# Patient Record
Sex: Male | Born: 1950
Health system: Southern US, Community
[De-identification: ages and names within clinical notes are randomized; demographics above are authoritative.]

## PROBLEM LIST (undated history)

## (undated) DIAGNOSIS — C44712 Basal cell carcinoma of skin of right lower limb, including hip: Secondary | ICD-10-CM

## (undated) DIAGNOSIS — F17210 Nicotine dependence, cigarettes, uncomplicated: Secondary | ICD-10-CM

## (undated) DIAGNOSIS — R918 Other nonspecific abnormal finding of lung field: Secondary | ICD-10-CM

## (undated) DIAGNOSIS — J189 Pneumonia, unspecified organism: Secondary | ICD-10-CM

## (undated) DIAGNOSIS — I10 Essential (primary) hypertension: Secondary | ICD-10-CM

## (undated) DIAGNOSIS — L02419 Cutaneous abscess of limb, unspecified: Secondary | ICD-10-CM

## (undated) DIAGNOSIS — N401 Enlarged prostate with lower urinary tract symptoms: Secondary | ICD-10-CM

## (undated) DIAGNOSIS — L03119 Cellulitis of unspecified part of limb: Secondary | ICD-10-CM

## (undated) DIAGNOSIS — A4902 Methicillin resistant Staphylococcus aureus infection, unspecified site: Secondary | ICD-10-CM

## (undated) DIAGNOSIS — R351 Nocturia: Secondary | ICD-10-CM

## (undated) DIAGNOSIS — C61 Malignant neoplasm of prostate: Secondary | ICD-10-CM

## (undated) DIAGNOSIS — E871 Hypo-osmolality and hyponatremia: Secondary | ICD-10-CM

## (undated) DIAGNOSIS — M1990 Unspecified osteoarthritis, unspecified site: Secondary | ICD-10-CM

## (undated) DIAGNOSIS — F1010 Alcohol abuse, uncomplicated: Secondary | ICD-10-CM

## (undated) DIAGNOSIS — L309 Dermatitis, unspecified: Secondary | ICD-10-CM

## (undated) HISTORY — PX: BACK SURGERY: SHX140

## (undated) HISTORY — PX: EYE SURGERY: SHX253

## (undated) HISTORY — PX: OTHER SURGICAL HISTORY: SHX169

## (undated) HISTORY — PX: COLONOSCOPY W/ POLYPECTOMY: SHX1380

## (undated) HISTORY — PX: RETINAL DETACHMENT SURGERY: SHX105

---

## 1998-07-29 ENCOUNTER — Emergency Department (HOSPITAL_COMMUNITY): Admission: EM | Admit: 1998-07-29 | Discharge: 1998-07-29 | Payer: Self-pay | Admitting: Emergency Medicine

## 1998-08-10 ENCOUNTER — Emergency Department (HOSPITAL_COMMUNITY): Admission: EM | Admit: 1998-08-10 | Discharge: 1998-08-10 | Payer: Self-pay | Admitting: Emergency Medicine

## 2001-01-05 ENCOUNTER — Emergency Department (HOSPITAL_COMMUNITY): Admission: EM | Admit: 2001-01-05 | Discharge: 2001-01-05 | Payer: Self-pay | Admitting: *Deleted

## 2002-11-09 ENCOUNTER — Encounter: Payer: Self-pay | Admitting: Orthopedic Surgery

## 2002-11-10 ENCOUNTER — Observation Stay (HOSPITAL_COMMUNITY): Admission: RE | Admit: 2002-11-10 | Discharge: 2002-11-11 | Payer: Self-pay | Admitting: Orthopedic Surgery

## 2002-11-10 ENCOUNTER — Encounter: Payer: Self-pay | Admitting: Orthopedic Surgery

## 2003-11-04 ENCOUNTER — Inpatient Hospital Stay (HOSPITAL_BASED_OUTPATIENT_CLINIC_OR_DEPARTMENT_OTHER): Admission: RE | Admit: 2003-11-04 | Discharge: 2003-11-04 | Payer: Self-pay | Admitting: Cardiology

## 2014-01-27 HISTORY — PX: OTHER SURGICAL HISTORY: SHX169

## 2014-02-08 ENCOUNTER — Inpatient Hospital Stay (HOSPITAL_COMMUNITY)
Admission: EM | Admit: 2014-02-08 | Discharge: 2014-02-16 | DRG: 854 | Disposition: A | Discharge status: Home / Self Care | Payer: 59 | Attending: Internal Medicine | Admitting: Internal Medicine

## 2014-02-08 ENCOUNTER — Emergency Department (HOSPITAL_COMMUNITY): Payer: 59

## 2014-02-08 ENCOUNTER — Encounter (HOSPITAL_COMMUNITY): Payer: Self-pay | Admitting: Emergency Medicine

## 2014-02-08 DIAGNOSIS — F1010 Alcohol abuse, uncomplicated: Secondary | ICD-10-CM | POA: Diagnosis present

## 2014-02-08 DIAGNOSIS — C44711 Basal cell carcinoma of skin of unspecified lower limb, including hip: Secondary | ICD-10-CM | POA: Diagnosis present

## 2014-02-08 DIAGNOSIS — M009 Pyogenic arthritis, unspecified: Secondary | ICD-10-CM | POA: Diagnosis present

## 2014-02-08 DIAGNOSIS — M7040 Prepatellar bursitis, unspecified knee: Secondary | ICD-10-CM | POA: Diagnosis present

## 2014-02-08 DIAGNOSIS — C44722 Squamous cell carcinoma of skin of right lower limb, including hip: Secondary | ICD-10-CM | POA: Diagnosis present

## 2014-02-08 DIAGNOSIS — L259 Unspecified contact dermatitis, unspecified cause: Secondary | ICD-10-CM | POA: Diagnosis present

## 2014-02-08 DIAGNOSIS — F17210 Nicotine dependence, cigarettes, uncomplicated: Secondary | ICD-10-CM

## 2014-02-08 DIAGNOSIS — L02419 Cutaneous abscess of limb, unspecified: Secondary | ICD-10-CM | POA: Diagnosis present

## 2014-02-08 DIAGNOSIS — L03116 Cellulitis of left lower limb: Secondary | ICD-10-CM

## 2014-02-08 DIAGNOSIS — T3695XA Adverse effect of unspecified systemic antibiotic, initial encounter: Secondary | ICD-10-CM | POA: Diagnosis not present

## 2014-02-08 DIAGNOSIS — A4902 Methicillin resistant Staphylococcus aureus infection, unspecified site: Secondary | ICD-10-CM | POA: Diagnosis present

## 2014-02-08 DIAGNOSIS — E871 Hypo-osmolality and hyponatremia: Secondary | ICD-10-CM

## 2014-02-08 DIAGNOSIS — L03119 Cellulitis of unspecified part of limb: Secondary | ICD-10-CM | POA: Diagnosis present

## 2014-02-08 DIAGNOSIS — Z85828 Personal history of other malignant neoplasm of skin: Secondary | ICD-10-CM | POA: Diagnosis present

## 2014-02-08 DIAGNOSIS — M79609 Pain in unspecified limb: Secondary | ICD-10-CM

## 2014-02-08 DIAGNOSIS — L0390 Cellulitis, unspecified: Secondary | ICD-10-CM | POA: Diagnosis present

## 2014-02-08 DIAGNOSIS — F17200 Nicotine dependence, unspecified, uncomplicated: Secondary | ICD-10-CM | POA: Diagnosis present

## 2014-02-08 DIAGNOSIS — A419 Sepsis, unspecified organism: Principal | ICD-10-CM

## 2014-02-08 DIAGNOSIS — Z720 Tobacco use: Secondary | ICD-10-CM | POA: Diagnosis present

## 2014-02-08 DIAGNOSIS — I10 Essential (primary) hypertension: Secondary | ICD-10-CM | POA: Diagnosis present

## 2014-02-08 DIAGNOSIS — L309 Dermatitis, unspecified: Secondary | ICD-10-CM | POA: Diagnosis present

## 2014-02-08 DIAGNOSIS — B9689 Other specified bacterial agents as the cause of diseases classified elsewhere: Secondary | ICD-10-CM | POA: Diagnosis present

## 2014-02-08 DIAGNOSIS — M7989 Other specified soft tissue disorders: Secondary | ICD-10-CM

## 2014-02-08 DIAGNOSIS — C44712 Basal cell carcinoma of skin of right lower limb, including hip: Secondary | ICD-10-CM

## 2014-02-08 DIAGNOSIS — M71162 Other infective bursitis, left knee: Secondary | ICD-10-CM

## 2014-02-08 HISTORY — DX: Essential (primary) hypertension: I10

## 2014-02-08 HISTORY — DX: Basal cell carcinoma of skin of right lower limb, including hip: C44.712

## 2014-02-08 HISTORY — DX: Hypo-osmolality and hyponatremia: E87.1

## 2014-02-08 LAB — BASIC METABOLIC PANEL
Anion gap: 18 — ABNORMAL HIGH (ref 5–15)
BUN: 12 mg/dL (ref 6–23)
CO2: 19 mEq/L (ref 19–32)
Calcium: 8.9 mg/dL (ref 8.4–10.5)
Chloride: 84 mEq/L — ABNORMAL LOW (ref 96–112)
Creatinine, Ser: 1.17 mg/dL (ref 0.50–1.35)
GFR calc Af Amer: 75 mL/min — ABNORMAL LOW (ref >90)
GFR calc non Af Amer: 65 mL/min — ABNORMAL LOW (ref >90)
Glucose, Bld: 101 mg/dL — ABNORMAL HIGH (ref 70–99)
Potassium: 5.2 mEq/L (ref 3.7–5.3)
Sodium: 121 mEq/L — CL (ref 137–147)

## 2014-02-08 LAB — CBC WITH DIFFERENTIAL/PLATELET
Basophils Absolute: 0 10*3/uL (ref 0.0–0.1)
Basophils Relative: 0 % (ref 0–1)
Eosinophils Absolute: 0 10*3/uL (ref 0.0–0.7)
Eosinophils Relative: 0 % (ref 0–5)
HCT: 39.2 % (ref 39.0–52.0)
Hemoglobin: 14.1 g/dL (ref 13.0–17.0)
Lymphocytes Relative: 9 % — ABNORMAL LOW (ref 12–46)
Lymphs Abs: 1.5 10*3/uL (ref 0.7–4.0)
MCH: 32.7 pg (ref 26.0–34.0)
MCHC: 36 g/dL (ref 30.0–36.0)
MCV: 91 fL (ref 78.0–100.0)
Monocytes Absolute: 1.5 10*3/uL — ABNORMAL HIGH (ref 0.1–1.0)
Monocytes Relative: 9 % (ref 3–12)
Neutro Abs: 13.9 10*3/uL — ABNORMAL HIGH (ref 1.7–7.7)
Neutrophils Relative %: 82 % — ABNORMAL HIGH (ref 43–77)
Platelets: 252 10*3/uL (ref 150–400)
RBC: 4.31 MIL/uL (ref 4.22–5.81)
RDW: 12.6 % (ref 11.5–15.5)
WBC: 16.9 10*3/uL — ABNORMAL HIGH (ref 4.0–10.5)

## 2014-02-08 LAB — CK: Total CK: 32 U/L (ref 7–232)

## 2014-02-08 LAB — HEPATIC FUNCTION PANEL
ALT: 14 U/L (ref 0–53)
AST: 18 U/L (ref 0–37)
Albumin: 2.7 g/dL — ABNORMAL LOW (ref 3.5–5.2)
Alkaline Phosphatase: 82 U/L (ref 39–117)
Bilirubin, Direct: 0.4 mg/dL — ABNORMAL HIGH (ref 0.0–0.3)
Indirect Bilirubin: 0.9 mg/dL (ref 0.3–0.9)
Total Bilirubin: 1.3 mg/dL — ABNORMAL HIGH (ref 0.3–1.2)
Total Protein: 6.6 g/dL (ref 6.0–8.3)

## 2014-02-08 LAB — NA AND K (SODIUM & POTASSIUM), RAND UR
Potassium Urine: 62 mEq/L
Sodium, Ur: <20 mEq/L

## 2014-02-08 LAB — SYNOVIAL CELL COUNT + DIFF, W/ CRYSTALS
Eosinophils-Synovial: 0 % (ref 0–1)
Lymphocytes-Synovial Fld: 0 % (ref 0–20)
Monocyte-Macrophage-Synovial Fluid: 1 % — ABNORMAL LOW (ref 50–90)
Neutrophil, Synovial: 99 % — ABNORMAL HIGH (ref 0–25)

## 2014-02-08 LAB — CREATININE, SERUM
Creatinine, Ser: 1.1 mg/dL (ref 0.50–1.35)
GFR calc Af Amer: 81 mL/min — ABNORMAL LOW (ref >90)
GFR calc non Af Amer: 70 mL/min — ABNORMAL LOW (ref >90)

## 2014-02-08 LAB — CBC
HCT: 36.4 % — ABNORMAL LOW (ref 39.0–52.0)
Hemoglobin: 12.6 g/dL — ABNORMAL LOW (ref 13.0–17.0)
MCH: 31.5 pg (ref 26.0–34.0)
MCHC: 34.6 g/dL (ref 30.0–36.0)
MCV: 91 fL (ref 78.0–100.0)
Platelets: 235 10*3/uL (ref 150–400)
RBC: 4 MIL/uL — ABNORMAL LOW (ref 4.22–5.81)
RDW: 12.6 % (ref 11.5–15.5)
WBC: 15.1 10*3/uL — ABNORMAL HIGH (ref 4.0–10.5)

## 2014-02-08 LAB — ETHANOL: Alcohol, Ethyl (B): <11 mg/dL (ref 0–11)

## 2014-02-08 LAB — GRAM STAIN

## 2014-02-08 LAB — C-REACTIVE PROTEIN: CRP: 36.7 mg/dL — ABNORMAL HIGH (ref <0.60)

## 2014-02-08 LAB — OSMOLALITY: Osmolality: 255 mOsm/kg — ABNORMAL LOW (ref 275–300)

## 2014-02-08 LAB — I-STAT CG4 LACTIC ACID, ED: Lactic Acid, Venous: 1.29 mmol/L (ref 0.5–2.2)

## 2014-02-08 LAB — SEDIMENTATION RATE: Sed Rate: 67 mm/hr — ABNORMAL HIGH (ref 0–16)

## 2014-02-08 LAB — OSMOLALITY, URINE: Osmolality, Ur: 400 mOsm/kg (ref 390–1090)

## 2014-02-08 MED ORDER — LORAZEPAM 2 MG/ML IJ SOLN: 0.0000 mg | Freq: Two times a day (BID) | INTRAMUSCULAR | Status: DC

## 2014-02-08 MED ORDER — ADULT MULTIVITAMIN W/MINERALS CH
1.0000 | ORAL_TABLET | Freq: Every day | ORAL | Status: DC
  Administered 2014-02-08 – 2014-02-16 (×8): 1 via ORAL
  Filled 2014-02-08 (×9): qty 1

## 2014-02-08 MED ORDER — RAMIPRIL 5 MG PO CAPS
5.0000 mg | ORAL_CAPSULE | Freq: Every day | ORAL | Status: DC
  Administered 2014-02-09 – 2014-02-16 (×7): 5 mg via ORAL
  Filled 2014-02-08 (×8): qty 1

## 2014-02-08 MED ORDER — ACETAMINOPHEN 650 MG RE SUPP: 650.0000 mg | Freq: Four times a day (QID) | RECTAL | Status: DC | PRN

## 2014-02-08 MED ORDER — VITAMIN B-1 100 MG PO TABS
100.0000 mg | ORAL_TABLET | Freq: Every day | ORAL | Status: DC
  Administered 2014-02-08 – 2014-02-16 (×8): 100 mg via ORAL
  Filled 2014-02-08 (×9): qty 1

## 2014-02-08 MED ORDER — LORAZEPAM 2 MG/ML IJ SOLN
0.0000 mg | Freq: Four times a day (QID) | INTRAMUSCULAR | Status: AC
  Administered 2014-02-09: 2 mg via INTRAVENOUS
  Filled 2014-02-08: qty 1

## 2014-02-08 MED ORDER — ACETAMINOPHEN 325 MG PO TABS
650.0000 mg | ORAL_TABLET | Freq: Four times a day (QID) | ORAL | Status: DC | PRN
  Administered 2014-02-09: 650 mg via ORAL
  Filled 2014-02-08: qty 2

## 2014-02-08 MED ORDER — ONDANSETRON HCL 4 MG/2ML IJ SOLN
4.0000 mg | Freq: Once | INTRAMUSCULAR | Status: AC
  Administered 2014-02-08: 4 mg via INTRAVENOUS
  Filled 2014-02-08: qty 2

## 2014-02-08 MED ORDER — LORAZEPAM 2 MG/ML IJ SOLN: 1.0000 mg | Freq: Four times a day (QID) | INTRAMUSCULAR | Status: DC | PRN

## 2014-02-08 MED ORDER — MORPHINE SULFATE 4 MG/ML IJ SOLN
4.0000 mg | Freq: Once | INTRAMUSCULAR | Status: AC
  Administered 2014-02-08: 4 mg via INTRAVENOUS
  Filled 2014-02-08: qty 1

## 2014-02-08 MED ORDER — VANCOMYCIN HCL IN DEXTROSE 750-5 MG/150ML-% IV SOLN
750.0000 mg | Freq: Two times a day (BID) | INTRAVENOUS | Status: DC
  Administered 2014-02-09 – 2014-02-11 (×4): 750 mg via INTRAVENOUS
  Filled 2014-02-08 (×6): qty 150

## 2014-02-08 MED ORDER — THIAMINE HCL 100 MG/ML IJ SOLN
100.0000 mg | Freq: Every day | INTRAMUSCULAR | Status: DC
  Filled 2014-02-08 (×3): qty 1

## 2014-02-08 MED ORDER — AMLODIPINE BESYLATE 10 MG PO TABS
10.0000 mg | ORAL_TABLET | Freq: Every day | ORAL | Status: DC
  Administered 2014-02-09 – 2014-02-16 (×7): 10 mg via ORAL
  Filled 2014-02-08 (×3): qty 1
  Filled 2014-02-08: qty 2
  Filled 2014-02-08 (×2): qty 1
  Filled 2014-02-08: qty 2
  Filled 2014-02-08: qty 1

## 2014-02-08 MED ORDER — SODIUM CHLORIDE 0.9 % IV BOLUS (SEPSIS)
500.0000 mL | Freq: Once | INTRAVENOUS | Status: AC
  Administered 2014-02-08: 500 mL via INTRAVENOUS

## 2014-02-08 MED ORDER — HEPARIN SODIUM (PORCINE) 5000 UNIT/ML IJ SOLN
5000.0000 [IU] | Freq: Three times a day (TID) | INTRAMUSCULAR | Status: DC
  Administered 2014-02-08 – 2014-02-16 (×22): 5000 [IU] via SUBCUTANEOUS
  Filled 2014-02-08 (×30): qty 1

## 2014-02-08 MED ORDER — SODIUM CHLORIDE 0.9 % IV BOLUS (SEPSIS): 1000.0000 mL | Freq: Once | INTRAVENOUS | Status: DC

## 2014-02-08 MED ORDER — FOLIC ACID 1 MG PO TABS
1.0000 mg | ORAL_TABLET | Freq: Every day | ORAL | Status: DC
  Administered 2014-02-08 – 2014-02-16 (×8): 1 mg via ORAL
  Filled 2014-02-08 (×9): qty 1

## 2014-02-08 MED ORDER — VANCOMYCIN HCL 10 G IV SOLR
1500.0000 mg | Freq: Once | INTRAVENOUS | Status: AC
  Administered 2014-02-08: 1500 mg via INTRAVENOUS
  Filled 2014-02-08: qty 1500

## 2014-02-08 MED ORDER — MORPHINE SULFATE 2 MG/ML IJ SOLN
2.0000 mg | INTRAMUSCULAR | Status: DC | PRN
  Administered 2014-02-10 – 2014-02-15 (×10): 2 mg via INTRAVENOUS
  Filled 2014-02-08 (×10): qty 1

## 2014-02-08 MED ORDER — LORAZEPAM 1 MG PO TABS: 1.0000 mg | ORAL_TABLET | Freq: Four times a day (QID) | ORAL | Status: DC | PRN

## 2014-02-08 NOTE — H&P (Signed)
[facility] History and Physical  Jesse Pope PZW:[id number] DOB: Jul 09, 1951 DOA: 02/08/2014  Referring physician: Emergency Department PCP:  Melinda Crutch, MD  Specialists:   Chief Complaint: Knee pain  HPI: Jesse Pope is a 63 y.o. male  With a hx of HTN, tobacco and etoh abuse who presents to the ED with 1 week hx of L knee pain with pain. Pt in the ED noted to have WBC of 16k. Aspiration of knee was attempted at bedside yielding gross purulence. Orthopedic surgery was consulted for consideration for surgery and the hospitalist service consulted for admission.  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 Pt reports drinking 6-8 beers daily and daily tobacco abuse.   Review of Systems:  Per above, the remainder of the 10pt ros reviewed and are neg  Past Medical History  Diagnosis Date  . Basal cell carcinoma of right lower leg   . Hypertension    Past Surgical History  Procedure Laterality Date  . Back surgery     Social History:  reports that he has been smoking.  He does not have any smokeless tobacco history on file. He reports that he drinks alcohol. He reports that he does not use illicit drugs.  where does patient live--home, ALF, SNF? and with whom if at home?  Can patient participate in ADLs?  Allergies  Allergen Reactions  . Penicillins Rash    No family history on file. reviewed and is noncontributory to this particular case (be sure to complete)  Prior to Admission medications   Medication Sig Start Date End Date Taking? Authorizing Provider  amLODipine (NORVASC) 10 MG tablet Take 10 mg by mouth daily.   Yes Historical Provider, MD  imiquimod (ALDARA) 5 % cream Apply 1 application topically at bedtime.   Yes Historical Provider, MD  naproxen sodium (ANAPROX) 220 MG tablet Take 220 mg by mouth 2 (two) times daily with a meal.   Yes Historical Provider, MD  ramipril (ALTACE) 5 MG capsule Take 5 mg by  mouth daily.   Yes Historical Provider, MD  triamcinolone cream (KENALOG) 0.1 % Apply 1 application topically daily.   Yes Historical Provider, MD   Physical Exam: Filed Vitals:   02/08/14 0941 02/08/14 1100 02/08/14 1317 02/08/14 1414  BP: 105/66 108/72 121/68 121/65  Pulse: 103 88 90 89  Temp: 97 F (36.1 C)   97.4 F (36.3 C)  TempSrc: Oral   Oral  Resp:  18 16 16   SpO2: 98% 100% 100% 100%     General:  Awake, in nad  Eyes: PERRL B  ENT: membranes moist, dentition fair  Neck: trachea midline, neck supple  Cardiovascular: regular, s1, s2  Respiratory: normal resp effort, no wheezing  Abdomen: soft, nondistended  Skin: normal skin turgor, patch of confluent erythema over L LE, healing lesions over R anterior shin  Musculoskeletal: Perfused, no clubbing  Psychiatric: mood/affect normal // no auditory/visual hallucinations  Neurologic: cn2-12 grossly intact, strength/sensation intact  Labs on Admission:  Basic Metabolic Panel:  Recent Labs Lab 02/08/14 1000  NA 121*  K 5.2  CL 84*  CO2 19  GLUCOSE 101*  BUN 12  CREATININE 1.17  CALCIUM 8.9   Liver Function Tests: No results found for this basename: AST, ALT, ALKPHOS, BILITOT, PROT, ALBUMIN,  in the last 168 hours No results found for this basename: LIPASE, AMYLASE,  in the last 168 hours No results found  for this basename: AMMONIA,  in the last 168 hours CBC:  Recent Labs Lab 02/08/14 1000  WBC 16.9*  NEUTROABS 13.9*  HGB 14.1  HCT 39.2  MCV 91.0  PLT 252   Cardiac Enzymes:  Recent Labs Lab 02/08/14 1000  CKTOTAL 32    BNP (last 3 results) No results found for this basename: PROBNP,  in the last 8760 hours CBG: No results found for this basename: GLUCAP,  in the last 168 hours  Radiological Exams on Admission: Dg Knee Complete 4 Views Left  02/08/2014   CLINICAL DATA:  Left leg swelling and redness.  EXAM: LEFT KNEE - COMPLETE 4+ VIEW  COMPARISON:  None available.  FINDINGS: A  moderate joint effusion is present. Extensive superficial swelling is present about the knee. No acute osseous abnormality is present. Atherosclerotic calcifications are present.  IMPRESSION: 1. Extensive soft tissue swelling about the left knee. Differential diagnosis includes cellulitis or superficial edema. 2. Small to moderate joint effusion. 3. No acute or focal osseous abnormality. 4. Atherosclerosis.   Electronically Signed   By: Lawrence Santiago M.D.   On: 02/08/2014 10:50    Assessment/Plan Principal Problem:   Sepsis(995.91) Active Problems:   Septic infrapatellar bursitis of left knee   Arthritis, septic, knee   Hyponatremia   Cellulitis  1. Sepsis with cellultis 1. Purulent drainage noted 2. Pan cultures pending 3. Will cont on empiric vanc 4. Ortho following 5. Will admit to med-surg 2. HTN 1. BP stable 2. Cont home med 3. Hyponatremia 1. Will follow lytes 2. Pt reports chronic hyponatremia 3. Likely secondary to ETOH abuse 4. Will check serum osm, urine osm, and urine sodium/cl 4. Tobacco abuse 1. 51min cessation done face to face 5. ETOH abuse 1. Pt admitted to 6-8 beers per day 2. Will cont on CIWA protocol 6. DVT prophylaxis 1. Heparin subq  Code Status: Full (must indicate code status--if unknown or must be presumed, indicate so) Family Communication: Pt in room, wife at bedside (indicate person spoken with, if applicable, with phone number if by telephone) Disposition Plan: Pending (indicate anticipated LOS)  Time spent: 53min  [facility], [facility] Pager [phone]  If 7PM-7AM, please contact night-coverage [email or web address] Password [facility] 02/08/2014, 2:24 PM

## 2014-02-08 NOTE — Progress Notes (Signed)
ANTIBIOTIC CONSULT NOTE - INITIAL  Pharmacy Consult for vancomycin Indication: cellulitis  Allergies  Allergen Reactions  . Penicillins Rash    Patient Measurements:    Body Weight: 70.9 kg  Vital Signs: Temp: 97 F (36.1 C) (07/27 0941) Temp src: Oral (07/27 0941) BP: 108/72 mmHg (07/27 1100) Pulse Rate: 88 (07/27 1100) Intake/Output from previous day:   Intake/Output from this shift:    Labs:  Recent Labs  02/08/14 1000  WBC 16.9*  HGB 14.1  PLT 252  CREATININE 1.17   CrCl is unknown because there is no height on file for the current visit. No results found for this basename: VANCOTROUGH, VANCOPEAK, VANCORANDOM, GENTTROUGH, GENTPEAK, GENTRANDOM, TOBRATROUGH, TOBRAPEAK, TOBRARND, AMIKACINPEAK, AMIKACINTROU, AMIKACIN,  in the last 72 hours   Microbiology: No results found for this or any previous visit (from the past 720 hour(s)).  Medical History: Past Medical History  Diagnosis Date  . Basal cell carcinoma of right lower leg   . Hypertension     Medications:  See med history Assessment: 63 yo man to start vancomycin for cellulitis.  His SrCr 1.17, est CrCl ~70 ml/min.  Goal of Therapy:  Vancomycin trough level 10-15 mcg/ml  Plan:  Vancomycin 1500 mg IV X 1 then 750 mg IV q12 hours F/u renal function, cultures and clinical course  Thanks for allowing pharmacy to be a part of this patient's care.  Excell Seltzer, PharmD Clinical Pharmacist, [phone] 02/08/2014,12:43 PM

## 2014-02-08 NOTE — ED Provider Notes (Addendum)
CSN: [id number]     Arrival date & time 02/08/14  0930 History   First MD Initiated Contact with Patient 02/08/14 [id number]     Chief Complaint  Patient presents with  . Leg Swelling     (Consider location/radiation/quality/duration/timing/severity/associated sxs/prior Treatment) HPI Comments: Patient presents to the ER for evaluation of pain and swelling of the left leg. Patient reports that he initially noticed pain and swelling just below the knee of the left leg approximately one week ago. Since then the area has become more swollen. He reports redness, works around the knee and no swelling of the entire leg below the knee. Pain has progressively worsened. She reports severe pain when trying to ambulate on it. He denies injury to the area, although he does report that he often kneels on the floor at a coffee table to eat his meals, first noticed the pain when kneeling.   Past Medical History  Diagnosis Date  . Basal cell carcinoma of right lower leg   . Hypertension    Past Surgical History  Procedure Laterality Date  . Back surgery     No family history on file. History  Substance Use Topics  . Smoking status: Current Every Day Smoker  . Smokeless tobacco: Not on file  . Alcohol Use: Yes    Review of Systems  Musculoskeletal: Positive for arthralgias.  Skin: Positive for color change.  All other systems reviewed and are negative.     Allergies  Penicillins  Home Medications   Prior to Admission medications   Medication Sig Start Date End Date Taking? Authorizing Provider  amLODipine (NORVASC) 10 MG tablet Take 10 mg by mouth daily.   Yes Historical Provider, MD  imiquimod (ALDARA) 5 % cream Apply 1 application topically at bedtime.   Yes Historical Provider, MD  naproxen sodium (ANAPROX) 220 MG tablet Take 220 mg by mouth 2 (two) times daily with a meal.   Yes Historical Provider, MD  ramipril (ALTACE) 5 MG capsule Take 5 mg by mouth daily.   Yes Historical Provider,  MD  triamcinolone cream (KENALOG) 0.1 % Apply 1 application topically daily.   Yes Historical Provider, MD   BP 108/72  Pulse 88  Temp(Src) 97 F (36.1 C) (Oral)  Resp 18  SpO2 100% Physical Exam  Constitutional: He is oriented to person, place, and time. He appears well-developed and well-nourished. No distress.  HENT:  Head: Normocephalic and atraumatic.  Right Ear: Hearing normal.  Left Ear: Hearing normal.  Nose: Nose normal.  Mouth/Throat: Oropharynx is clear and moist and mucous membranes are normal.  Eyes: Conjunctivae and EOM are normal. Pupils are equal, round, and reactive to light.  Neck: Normal range of motion. Neck supple.  Cardiovascular: Regular rhythm, S1 normal and S2 normal.  Exam reveals no gallop and no friction rub.   No murmur heard. Pulses:      Dorsalis pedis pulses are 1+ on the right side, and 1+ on the left side.  Pulmonary/Chest: Effort normal and breath sounds normal. No respiratory distress. He exhibits no tenderness.  Abdominal: Soft. Normal appearance and bowel sounds are normal. There is no hepatosplenomegaly. There is no tenderness. There is no rebound, no guarding, no tenderness at McBurney's point and negative Murphy's sign. No hernia.  Musculoskeletal:       Left knee: He exhibits decreased range of motion, swelling, effusion (infrapatellar) and erythema.       Legs: Neurological: He is alert and oriented to person, place, and time.  He has normal strength. No cranial nerve deficit or sensory deficit. Coordination normal. GCS eye subscore is 4. GCS verbal subscore is 5. GCS motor subscore is 6.  Skin: Skin is warm, dry and intact. No rash noted. There is erythema. No cyanosis.     Psychiatric: He has a normal mood and affect. His speech is normal and behavior is normal. Thought content normal.    ED Course  Procedures (including critical care time)  Aspiration infrapatellar bursitis: Area was prepped with Betadine and sterilely draped. Time  out called prior to the procedure. Sterile precautions were followed. Area of fluctuance in the infrapatellar region was identified and anesthetized with 2% lidocaine. An 18-gauge needle was then advanced through the anesthetized skin under constant aspiration until bloody fluid and pus was aspirated from the region. This was sent for studies. Patient tolerated procedure well. No complications.  Labs Review Labs Reviewed  CBC WITH DIFFERENTIAL - Abnormal; Notable for the following:    WBC 16.9 (*)    Neutrophils Relative % 82 (*)    Neutro Abs 13.9 (*)    Lymphocytes Relative 9 (*)    Monocytes Absolute 1.5 (*)    All other components within normal limits  BASIC METABOLIC PANEL - Abnormal; Notable for the following:    Sodium 121 (*)    Chloride 84 (*)    Glucose, Bld 101 (*)    GFR calc non Af Amer 65 (*)    GFR calc Af Amer 75 (*)    Anion gap 18 (*)    All other components within normal limits  CULTURE, BLOOD (ROUTINE X 2)  CULTURE, BLOOD (ROUTINE X 2)  BODY FLUID CULTURE  GRAM STAIN  CK  SEDIMENTATION RATE  C-REACTIVE PROTEIN  SYNOVIAL CELL COUNT + DIFF, W/ CRYSTALS  I-STAT CG4 LACTIC ACID, ED    Imaging Review Dg Knee Complete 4 Views Left  02/08/2014   CLINICAL DATA:  Left leg swelling and redness.  EXAM: LEFT KNEE - COMPLETE 4+ VIEW  COMPARISON:  None available.  FINDINGS: A moderate joint effusion is present. Extensive superficial swelling is present about the knee. No acute osseous abnormality is present. Atherosclerotic calcifications are present.  IMPRESSION: 1. Extensive soft tissue swelling about the left knee. Differential diagnosis includes cellulitis or superficial edema. 2. Small to moderate joint effusion. 3. No acute or focal osseous abnormality. 4. Atherosclerosis.   Electronically Signed   By: Lawrence Santiago M.D.   On: 02/08/2014 10:50     EKG Interpretation None      MDM   Final diagnoses:  Hyponatremia  Septic infrapatellar bursitis of left knee    Patient presents to the ER for evaluation of pain and swelling of the left knee and lower leg. He was referred here by his primary care physician. Patient reports symptoms began one week ago and have progressively worsened. Patient had a large ballotable, fluctuant area just below the patella consistent with infrapatellar bursitis. There was significant overlying erythema, warmth, edema and swelling. Patient has swelling and erythema advancing down the leg to the foot. Patient was mildly tachycardic and his blood pressure was in the 834 systolic upon arrival, but is afebrile. Sepsis is felt unlikely, but possible. Patient does have a marked leukocytosis.  Case was discussed with Doctor Supple, on-call for orthopedics when the patient has been seen previously. He did confirm that I could safely aspirate the region to collect fluid prior to initiation of antibiotics. Fluid was aspirated from the infrapatellar region.  There was a mixture of bloody fluid and what appeared to be pus. I did not aspirate the joint. Fluid was sent for studies including culture.  Patient initiated on IV vancomycin. Blood work revealed hyponatremia in addition to his leukocytosis. Patient reports that this is a known problem for him. I do not have a baseline number for his sodium level, however.  The patient given gentle hydration here in the ER because of his hyponatremia. Blood pressure has improved prior to initiation of fluids. He will be admitted by medicine for further management.  Addendum: Discussed once again with Doctor Supple. He recommends keeping the patient n.p.o. until he can see the patient this afternoon to determine if there are any surgical interventions required.  Orpah Greek, MD 02/08/14 [place], MD 02/08/14 1316

## 2014-02-08 NOTE — ED Notes (Signed)
Brought pt back to room via wheelchair; pt given gown to change in to; Anda Kraft, RN present in room

## 2014-02-08 NOTE — ED Notes (Signed)
Patient presents today with a chief complaint of left leg erythema and pain since last Tuesday. Patient reports pain is worse upon ambulation and bending his leg. Patient reports biopsy of right leg mid July.

## 2014-02-08 NOTE — Progress Notes (Signed)
Left lower extremity venous duplex completed.  Left:  No evidence of DVT, superficial thrombosis, or Baker's cyst.  Right:  Negative for DVT in the common femoral vein.  

## 2014-02-08 NOTE — Consult Note (Signed)
Reason for Consult:left leg pain and swelling Referring Physician: EDP  HPI: Jesse Pope is an 63 y.o. male with an approximately 1 week hx of progressively increasing left knee and leg pain and swelling. 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 Basal cell carcinoma of right lower leg   . Hypertension     Past Surgical History  Procedure Laterality Date  . Back surgery      History reviewed. No pertinent family history.  Social History:  reports that he has been smoking.  He does not have any smokeless tobacco history on file. He reports that he drinks alcohol. He reports that he does not use illicit drugs.  Allergies:  Allergies  Allergen Reactions  . Penicillins Rash    Medications: I have reviewed the patient's current medications.  Results for orders placed during the hospital encounter of 02/08/14 (from the past 48 hour(s))  CBC WITH DIFFERENTIAL     Status: Abnormal   Collection Time    02/08/14 10:00 AM      Result Value Ref Range   WBC 16.9 (*) 4.0 - 10.5 K/uL   RBC 4.31  4.22 - 5.81 MIL/uL   Hemoglobin 14.1  13.0 - 17.0 g/dL   HCT 39.2  39.0 - 52.0 %   MCV 91.0  78.0 - 100.0 fL   MCH 32.7  26.0 - 34.0 pg   MCHC 36.0  30.0 - 36.0 g/dL   RDW 12.6  11.5 - 15.5 %   Platelets 252  150 - 400 K/uL   Neutrophils Relative % 82 (*) 43 - 77 %   Neutro Abs 13.9 (*) 1.7 - 7.7 K/uL   Lymphocytes Relative 9 (*) 12 - 46 %   Lymphs Abs 1.5  0.7 - 4.0 K/uL   Monocytes Relative 9  3 - 12 %   Monocytes Absolute 1.5 (*) 0.1 - 1.0 K/uL   Eosinophils Relative 0  0 - 5 %   Eosinophils Absolute 0.0  0.0 - 0.7 K/uL   Basophils Relative 0  0 - 1 %   Basophils Absolute 0.0  0.0 - 0.1 K/uL  BASIC METABOLIC PANEL     Status: Abnormal   Collection Time    02/08/14 10:00 AM      Result Value Ref Range   Sodium 121 (*) 137 - 147 mEq/L   Comment: CRITICAL RESULT CALLED TO,  READ BACK BY AND VERIFIED WITH:     [facility] 02/08/14 1111 BY BSLADE   Potassium 5.2  3.7 - 5.3 mEq/L   Chloride 84 (*) 96 - 112 mEq/L   CO2 19  19 - 32 mEq/L   Glucose, Bld 101 (*) 70 - 99 mg/dL   BUN 12  6 - 23 mg/dL   Creatinine, Ser 1.17  0.50 - 1.35 mg/dL   Calcium 8.9  8.4 - 10.5 mg/dL   GFR calc non Af Amer 65 (*) >90 mL/min   GFR calc Af Amer 75 (*) >90 mL/min   Comment: (NOTE)     The eGFR has been calculated using the CKD EPI equation.     This calculation has not been validated in all clinical situations.     eGFR's persistently <90 mL/min signify possible Chronic Kidney     Disease.   Anion gap 18 (*) 5 - 15  CK     Status: None  Collection Time    02/08/14 10:00 AM      Result Value Ref Range   Total CK 32  7 - 232 U/L  SEDIMENTATION RATE     Status: Abnormal   Collection Time    02/08/14 10:00 AM      Result Value Ref Range   Sed Rate 67 (*) 0 - 16 mm/hr  GRAM STAIN     Status: None   Collection Time    02/08/14 12:25 PM      Result Value Ref Range   Specimen Description SYNOVIAL FLUID LEFT KNEE     Special Requests NONE     Gram Stain       Value: ABUNDANT WBC PRESENT,BOTH PMN AND MONONUCLEAR     ABUNDANT GRAM POSITIVE COCCI IN PAIRS IN CLUSTERS   Report Status 02/08/2014 FINAL    SYNOVIAL CELL COUNT + DIFF, W/ CRYSTALS     Status: Abnormal   Collection Time    02/08/14 12:25 PM      Result Value Ref Range   Color, Synovial RED (*) YELLOW   Appearance-Synovial TURBID (*) CLEAR   Crystals, Fluid EXTRACELLULAR CALCIUM PYROPHOSPHATE CRYSTALS     WBC, Synovial NOT DONE  0 - 200 /cu mm   Comment: SPECIMEN CLOTTED   Neutrophil, Synovial 99 (*) 0 - 25 %   Lymphocytes-Synovial Fld 0  0 - 20 %   Monocyte-Macrophage-Synovial Fluid 1 (*) 50 - 90 %   Eosinophils-Synovial 0  0 - 1 %   Other Cells-SYN Microorganisms present, correlate with Micro.     Comment: Called to Van Clines, RN 1536 02/08/14 D Leory Plowman  I-STAT CG4 LACTIC ACID, ED     Status: None    Collection Time    02/08/14 12:52 PM      Result Value Ref Range   Lactic Acid, Venous 1.29  0.5 - 2.2 mmol/L  CBC     Status: Abnormal   Collection Time    02/08/14  1:45 PM      Result Value Ref Range   WBC 15.1 (*) 4.0 - 10.5 K/uL   RBC 4.00 (*) 4.22 - 5.81 MIL/uL   Hemoglobin 12.6 (*) 13.0 - 17.0 g/dL   HCT 36.4 (*) 39.0 - 52.0 %   MCV 91.0  78.0 - 100.0 fL   MCH 31.5  26.0 - 34.0 pg   MCHC 34.6  30.0 - 36.0 g/dL   RDW 12.6  11.5 - 15.5 %   Platelets 235  150 - 400 K/uL  CREATININE, SERUM     Status: Abnormal   Collection Time    02/08/14  1:45 PM      Result Value Ref Range   Creatinine, Ser 1.10  0.50 - 1.35 mg/dL   GFR calc non Af Amer 70 (*) >90 mL/min   GFR calc Af Amer 81 (*) >90 mL/min   Comment: (NOTE)     The eGFR has been calculated using the CKD EPI equation.     This calculation has not been validated in all clinical situations.     eGFR's persistently <90 mL/min signify possible Chronic Kidney     Disease.  HEPATIC FUNCTION PANEL     Status: Abnormal   Collection Time    02/08/14  1:45 PM      Result Value Ref Range   Total Protein 6.6  6.0 - 8.3 g/dL   Albumin 2.7 (*) 3.5 - 5.2 g/dL   AST 18  0 - 37  U/L   ALT 14  0 - 53 U/L   Alkaline Phosphatase 82  39 - 117 U/L   Total Bilirubin 1.3 (*) 0.3 - 1.2 mg/dL   Bilirubin, Direct 0.4 (*) 0.0 - 0.3 mg/dL   Indirect Bilirubin 0.9  0.3 - 0.9 mg/dL  ETHANOL     Status: None   Collection Time    02/08/14  1:45 PM      Result Value Ref Range   Alcohol, Ethyl (B) <11  0 - 11 mg/dL   Comment:            LOWEST DETECTABLE LIMIT FOR     SERUM ALCOHOL IS 11 mg/dL     FOR MEDICAL PURPOSES ONLY    Dg Knee Complete 4 Views Left  02/08/2014   CLINICAL DATA:  Left leg swelling and redness.  EXAM: LEFT KNEE - COMPLETE 4+ VIEW  COMPARISON:  None available.  FINDINGS: A moderate joint effusion is present. Extensive superficial swelling is present about the knee. No acute osseous abnormality is present. Atherosclerotic  calcifications are present.  IMPRESSION: 1. Extensive soft tissue swelling about the left knee. Differential diagnosis includes cellulitis or superficial edema. 2. Small to moderate joint effusion. 3. No acute or focal osseous abnormality. 4. Atherosclerosis.   Electronically Signed   By: Lawrence Santiago M.D.   On: 02/08/2014 10:50     Vitals Temp:  [97 F (36.1 C)-97.4 F (36.3 C)] 97.4 F (36.3 C) (07/27 1414) Pulse Rate:  [88-103] 89 (07/27 1414) Resp:  [16-18] 16 (07/27 1414) BP: (105-121)/(65-72) 121/65 mmHg (07/27 1414) SpO2:  [98 %-100 %] 100 % (07/27 1414) Weight:  [69.809 kg (153 lb 14.4 oz)] 69.809 kg (153 lb 14.4 oz) (07/27 1414) Body mass index is 21.47 kg/(m^2).  Physical Exam: left leg with diffuse swelling from knee distally. Erythema extends from mid thigh to mid leg with epicenter at level of tibial tubercle. Induration but no fluctuance. 15cc pus reportedly aspirated from prepatellar region, no obvious effusion, flexes to 90 degrees easily.     Assessment/Plan: Impression: septic prepatellar bursititis Treatment: todays aspiration has decompressed bursae and at this time i do not feel that surgical I and D is necessary. Agree with IV ABX and adjust as Cx indicate. Will follow clinical status closely to determine if debridement might be required.   Nolene Rocks M 02/08/2014, 4:57 PM

## 2014-02-09 DIAGNOSIS — M76899 Other specified enthesopathies of unspecified lower limb, excluding foot: Secondary | ICD-10-CM

## 2014-02-09 DIAGNOSIS — B9689 Other specified bacterial agents as the cause of diseases classified elsewhere: Secondary | ICD-10-CM

## 2014-02-09 DIAGNOSIS — A499 Bacterial infection, unspecified: Secondary | ICD-10-CM

## 2014-02-09 LAB — COMPREHENSIVE METABOLIC PANEL
ALT: 19 U/L (ref 0–53)
AST: 28 U/L (ref 0–37)
Albumin: 2.6 g/dL — ABNORMAL LOW (ref 3.5–5.2)
Alkaline Phosphatase: 70 U/L (ref 39–117)
Anion gap: 13 (ref 5–15)
BUN: 17 mg/dL (ref 6–23)
CO2: 23 mEq/L (ref 19–32)
Calcium: 8.4 mg/dL (ref 8.4–10.5)
Chloride: 86 mEq/L — ABNORMAL LOW (ref 96–112)
Creatinine, Ser: 0.95 mg/dL (ref 0.50–1.35)
GFR calc Af Amer: >90 mL/min (ref >90)
GFR calc non Af Amer: 87 mL/min — ABNORMAL LOW (ref >90)
Glucose, Bld: 93 mg/dL (ref 70–99)
Potassium: 4.2 mEq/L (ref 3.7–5.3)
Sodium: 122 mEq/L — ABNORMAL LOW (ref 137–147)
Total Bilirubin: 1 mg/dL (ref 0.3–1.2)
Total Protein: 6.6 g/dL (ref 6.0–8.3)

## 2014-02-09 LAB — CBC
HCT: 34.9 % — ABNORMAL LOW (ref 39.0–52.0)
Hemoglobin: 12.5 g/dL — ABNORMAL LOW (ref 13.0–17.0)
MCH: 32.1 pg (ref 26.0–34.0)
MCHC: 35.8 g/dL (ref 30.0–36.0)
MCV: 89.5 fL (ref 78.0–100.0)
Platelets: 276 10*3/uL (ref 150–400)
RBC: 3.9 MIL/uL — ABNORMAL LOW (ref 4.22–5.81)
RDW: 12.8 % (ref 11.5–15.5)
WBC: 13.4 10*3/uL — ABNORMAL HIGH (ref 4.0–10.5)

## 2014-02-09 MED ORDER — SODIUM CHLORIDE 0.9 % IV SOLN
INTRAVENOUS | Status: DC
  Administered 2014-02-09 – 2014-02-11 (×2): via INTRAVENOUS

## 2014-02-09 NOTE — Progress Notes (Signed)
[facility] PROGRESS NOTE  Jesse Pope POE:[id number] DOB: [birth year] DOA: 02/08/2014 PCP:  Melinda Crutch, MD  Assessment/Plan: Septic prepatellar bursitis of left knee -staph aureus -Status post aspiration per orthopedic/Dr. supple on 7/27 -Continue empiric antibiotics with vancomycin pending staph aureus sensitivities -Orthopedics to follow and reassess ? I&D  -Continue pain management  Active Problems:  Left lower extremity cellulitis -Improving clinically -Continue empiric antibiotics with as above  Sepsis syndrome -Secondary to above, see management as above   Hyponatremia  -No significant change with fluid restriction on admission. -Urine sodium is less than 20, more consistent with volume depletion -Will hydrate follow and recheck Alcohol abuse -Continue CIWA protocol, no evidence of withdrawal Tobacco abuse -Counseled to stop smoking  Code Status: Full  Family Communication: Wife at bedside Disposition Plan: Pending clinical course   Consultants:  Orthopedics  Procedures:  Status post aspiration of right knee prepatellar bursa on 7/27  Antibiotics:  Vancomycin  HPI/Subjective: Still with left leg pain and redness but states less  Objective: Filed Vitals:   02/09/14 0619  BP: 135/75  Pulse: 95  Temp: 98.9 F (37.2 C)  Resp: 16    Intake/Output Summary (Last 24 hours) at 02/09/14 1207 Last data filed at 02/09/14 0507  Gross per 24 hour  Intake    240 ml  Output    800 ml  Net   -560 ml   Filed Weights   02/08/14 1414  Weight: 69.809 kg (153 lb 14.4 oz)    Exam:  General: alert & oriented x 3 In NAD Cardiovascular: RRR, nl S1 s2 Respiratory: CTAB Abdomen: soft +BS NT/ND, no masses palpable Extremities: Left lower extremity with +2-3 edema, decreasing erythema, swollen left knee, tender, small ballotable effusion    Data Reviewed: Basic Metabolic Panel:  Recent Labs Lab 02/08/14 1000 02/08/14 1345 02/09/14 0442  NA 121*   --  122*  K 5.2  --  4.2  CL 84*  --  86*  CO2 19  --  23  GLUCOSE 101*  --  93  BUN 12  --  17  CREATININE 1.17 1.10 0.95  CALCIUM 8.9  --  8.4   Liver Function Tests:  Recent Labs Lab 02/08/14 1345 02/09/14 0442  AST 18 28  ALT 14 19  ALKPHOS 82 70  BILITOT 1.3* 1.0  PROT 6.6 6.6  ALBUMIN 2.7* 2.6*   No results found for this basename: LIPASE, AMYLASE,  in the last 168 hours No results found for this basename: AMMONIA,  in the last 168 hours CBC:  Recent Labs Lab 02/08/14 1000 02/08/14 1345 02/09/14 0442  WBC 16.9* 15.1* 13.4*  NEUTROABS 13.9*  --   --   HGB 14.1 12.6* 12.5*  HCT 39.2 36.4* 34.9*  MCV 91.0 91.0 89.5  PLT 252 235 276   Cardiac Enzymes:  Recent Labs Lab 02/08/14 1000  CKTOTAL 32   BNP (last 3 results) No results found for this basename: PROBNP,  in the last 8760 hours CBG: No results found for this basename: GLUCAP,  in the last 168 hours  Recent Results (from the past 240 hour(s))  CULTURE, BLOOD (ROUTINE X 2)     Status: None   Collection Time    02/08/14 10:00 AM      Result Value Ref Range Status   Specimen Description BLOOD RIGHT ANTECUBITAL   Final   Special Requests BOTTLES DRAWN AEROBIC AND ANAEROBIC [facility]   Final   Culture  Setup Time  Final   Value: 02/08/2014 16:15     Performed at [facility]   Culture     Final   Value:        BLOOD CULTURE RECEIVED NO GROWTH TO DATE CULTURE WILL BE HELD FOR 5 DAYS BEFORE ISSUING A FINAL NEGATIVE REPORT     Performed at [facility]   Report Status PENDING   Incomplete  CULTURE, BLOOD (ROUTINE X 2)     Status: None   Collection Time    02/08/14 10:05 AM      Result Value Ref Range Status   Specimen Description BLOOD LEFT FOREARM   Final   Special Requests BOTTLES DRAWN AEROBIC AND ANAEROBIC 6CC   Final   Culture  Setup Time     Final   Value: 02/08/2014 16:15     Performed at [facility]   Culture     Final   Value:        BLOOD CULTURE RECEIVED NO  GROWTH TO DATE CULTURE WILL BE HELD FOR 5 DAYS BEFORE ISSUING A FINAL NEGATIVE REPORT     Performed at [facility]   Report Status PENDING   Incomplete  BODY FLUID CULTURE     Status: None   Collection Time    02/08/14 12:25 PM      Result Value Ref Range Status   Specimen Description SYNOVIAL FLUID LEFT KNEE   Final   Special Requests NONE   Final   Gram Stain     Final   Value: ABUNDANT WBC PRESENT,BOTH PMN AND MONONUCLEAR     ABUNDANT GRAM POSITIVE COCCI     IN PAIRS IN CLUSTERS Performed at [facility]     Performed at [facility]   Culture     Final   Value: ABUNDANT STAPHYLOCOCCUS AUREUS     Note: RIFAMPIN AND GENTAMICIN SHOULD NOT BE USED AS SINGLE DRUGS FOR TREATMENT OF STAPH INFECTIONS.     Performed at [facility]   Report Status PENDING   Incomplete  GRAM STAIN     Status: None   Collection Time    02/08/14 12:25 PM      Result Value Ref Range Status   Specimen Description SYNOVIAL FLUID LEFT KNEE   Final   Special Requests NONE   Final   Gram Stain     Final   Value: ABUNDANT WBC PRESENT,BOTH PMN AND MONONUCLEAR     ABUNDANT GRAM POSITIVE COCCI IN PAIRS IN CLUSTERS   Report Status 02/08/2014 FINAL   Final     Studies: Dg Knee Complete 4 Views Left  02/08/2014   CLINICAL DATA:  Left leg swelling and redness.  EXAM: LEFT KNEE - COMPLETE 4+ VIEW  COMPARISON:  None available.  FINDINGS: A moderate joint effusion is present. Extensive superficial swelling is present about the knee. No acute osseous abnormality is present. Atherosclerotic calcifications are present.  IMPRESSION: 1. Extensive soft tissue swelling about the left knee. Differential diagnosis includes cellulitis or superficial edema. 2. Small to moderate joint effusion. 3. No acute or focal osseous abnormality. 4. Atherosclerosis.   Electronically Signed   By: Lawrence Santiago M.D.   On: 02/08/2014 10:50    Scheduled Meds: . amLODipine  10 mg Oral Daily  . folic acid  1 mg Oral  Daily  . heparin  5,000 Units Subcutaneous 3 times per day  . LORazepam  0-4 mg Intravenous Q6H   Followed by  . [START  ON 02/10/2014] LORazepam  0-4 mg Intravenous Q12H  . multivitamin with minerals  1 tablet Oral Daily  . ramipril  5 mg Oral Daily  . thiamine  100 mg Oral Daily   Or  . thiamine  100 mg Intravenous Daily  . vancomycin  750 mg Intravenous Q12H   Continuous Infusions:   Principal Problem:   Sepsis(995.91) Active Problems:   Septic infrapatellar bursitis of left knee   Arthritis, septic, knee   Hyponatremia   Cellulitis    Time spent: [facility] Pager [phone]. If 7PM-7AM, please contact night-coverage at [email or web address], password [facility] 02/09/2014, 12:07 PM  LOS: 1 day

## 2014-02-09 NOTE — Progress Notes (Signed)
Utilization review completed.  

## 2014-02-09 NOTE — Progress Notes (Signed)
Jesse Pope  MRN: [id number] DOB/Age: [birth year] 64 y.o. Physician: Ander Slade, Pope.D.      Subjective: Resting comfortably this afternoon, reports less pain Vital Signs Temp:  [98.3 F (36.8 C)-98.9 F (37.2 C)] 98.5 F (36.9 C) (07/28 1428) Pulse Rate:  [83-95] 83 (07/28 1428) Resp:  [16] 16 (07/28 1428) BP: (132-139)/(75-81) 132/81 mmHg (07/28 1428) SpO2:  [96 %-98 %] 98 % (07/28 1428)  Lab Results  Recent Labs  02/08/14 1345 02/09/14 0442  WBC 15.1* 13.4*  HGB 12.6* 12.5*  HCT 36.4* 34.9*  PLT 235 276   BMET  Recent Labs  02/08/14 1000 02/08/14 1345 02/09/14 0442  NA 121*  --  122*  K 5.2  --  4.2  CL 84*  --  86*  CO2 19  --  23  GLUCOSE 101*  --  93  BUN 12  --  17  CREATININE 1.17 1.10 0.95  CALCIUM 8.9  --  8.4   No results found for this basename: inr     Exam  Left leg with significantly less swelling, erythema persists about knee. Induration/fluctuance prepatellar region improved  Initial cx shows staph  Impression: Staph septic prepatellar bursitis, much improved after aspiration and IV ABX.   Plan Will continue to follow to see if prepatellar fluid collection/induration resolves. If collection persists would consider I and D. Possible I and D Thursday if necessary.  Jesse Pope 02/09/2014, 2:34 PM

## 2014-02-10 ENCOUNTER — Encounter (HOSPITAL_COMMUNITY): Payer: Self-pay | Admitting: Anesthesiology

## 2014-02-10 ENCOUNTER — Encounter (HOSPITAL_COMMUNITY)
Admission: EM | Disposition: A | Discharge status: Home / Self Care | Payer: Self-pay | Source: Home / Self Care | Attending: Internal Medicine

## 2014-02-10 ENCOUNTER — Inpatient Hospital Stay (HOSPITAL_COMMUNITY): Payer: 59 | Admitting: Anesthesiology

## 2014-02-10 ENCOUNTER — Encounter (HOSPITAL_COMMUNITY): Payer: 59 | Admitting: Anesthesiology

## 2014-02-10 HISTORY — PX: I&D EXTREMITY: SHX5045

## 2014-02-10 LAB — BASIC METABOLIC PANEL
Anion gap: 14 (ref 5–15)
BUN: 13 mg/dL (ref 6–23)
CO2: 25 mEq/L (ref 19–32)
Calcium: 8.5 mg/dL (ref 8.4–10.5)
Chloride: 92 mEq/L — ABNORMAL LOW (ref 96–112)
Creatinine, Ser: 0.91 mg/dL (ref 0.50–1.35)
GFR calc Af Amer: >90 mL/min (ref >90)
GFR calc non Af Amer: 88 mL/min — ABNORMAL LOW (ref >90)
Glucose, Bld: 108 mg/dL — ABNORMAL HIGH (ref 70–99)
Potassium: 4.8 mEq/L (ref 3.7–5.3)
Sodium: 131 mEq/L — ABNORMAL LOW (ref 137–147)

## 2014-02-10 LAB — CBC
HCT: 36.2 % — ABNORMAL LOW (ref 39.0–52.0)
Hemoglobin: 12.5 g/dL — ABNORMAL LOW (ref 13.0–17.0)
MCH: 31.3 pg (ref 26.0–34.0)
MCHC: 34.5 g/dL (ref 30.0–36.0)
MCV: 90.7 fL (ref 78.0–100.0)
Platelets: 311 10*3/uL (ref 150–400)
RBC: 3.99 MIL/uL — ABNORMAL LOW (ref 4.22–5.81)
RDW: 12.8 % (ref 11.5–15.5)
WBC: 15 10*3/uL — ABNORMAL HIGH (ref 4.0–10.5)

## 2014-02-10 LAB — BODY FLUID CULTURE

## 2014-02-10 LAB — PATHOLOGIST SMEAR REVIEW

## 2014-02-10 LAB — VANCOMYCIN, TROUGH: Vancomycin Tr: 7.6 ug/mL — ABNORMAL LOW (ref 10.0–20.0)

## 2014-02-10 SURGERY — IRRIGATION AND DEBRIDEMENT EXTREMITY
Anesthesia: General | Laterality: Left

## 2014-02-10 MED ORDER — MIDAZOLAM HCL 5 MG/5ML IJ SOLN
INTRAMUSCULAR | Status: DC | PRN
  Administered 2014-02-10: 2 mg via INTRAVENOUS

## 2014-02-10 MED ORDER — FENTANYL CITRATE 0.05 MG/ML IJ SOLN
INTRAMUSCULAR | Status: DC | PRN
  Administered 2014-02-10: 50 ug via INTRAVENOUS
  Administered 2014-02-10 (×3): 25 ug via INTRAVENOUS
  Administered 2014-02-10: 50 ug via INTRAVENOUS

## 2014-02-10 MED ORDER — ONDANSETRON HCL 4 MG PO TABS: 4.0000 mg | ORAL_TABLET | Freq: Four times a day (QID) | ORAL | Status: DC | PRN

## 2014-02-10 MED ORDER — PROPOFOL 10 MG/ML IV BOLUS
INTRAVENOUS | Status: DC | PRN
  Administered 2014-02-10: 200 mg via INTRAVENOUS

## 2014-02-10 MED ORDER — PROPOFOL 10 MG/ML IV BOLUS
INTRAVENOUS | Status: AC
  Filled 2014-02-10: qty 20

## 2014-02-10 MED ORDER — LIDOCAINE HCL (CARDIAC) 20 MG/ML IV SOLN
INTRAVENOUS | Status: DC | PRN
  Administered 2014-02-10: 80 mg via INTRAVENOUS

## 2014-02-10 MED ORDER — ONDANSETRON HCL 4 MG/2ML IJ SOLN: 4.0000 mg | Freq: Four times a day (QID) | INTRAMUSCULAR | Status: DC | PRN

## 2014-02-10 MED ORDER — OXYCODONE HCL 5 MG/5ML PO SOLN: 5.0000 mg | Freq: Once | ORAL | Status: DC | PRN

## 2014-02-10 MED ORDER — KETOROLAC TROMETHAMINE 30 MG/ML IJ SOLN
INTRAMUSCULAR | Status: AC
  Filled 2014-02-10: qty 1

## 2014-02-10 MED ORDER — FENTANYL CITRATE 0.05 MG/ML IJ SOLN
INTRAMUSCULAR | Status: AC
  Filled 2014-02-10: qty 5

## 2014-02-10 MED ORDER — ONDANSETRON HCL 4 MG/2ML IJ SOLN
INTRAMUSCULAR | Status: DC | PRN
  Administered 2014-02-10: 4 mg via INTRAVENOUS

## 2014-02-10 MED ORDER — NEOSTIGMINE METHYLSULFATE 10 MG/10ML IV SOLN
INTRAVENOUS | Status: AC
  Filled 2014-02-10: qty 1

## 2014-02-10 MED ORDER — HYDROCODONE-ACETAMINOPHEN 5-325 MG PO TABS: 1.0000 | ORAL_TABLET | Freq: Four times a day (QID) | ORAL | Status: DC

## 2014-02-10 MED ORDER — LACTATED RINGERS IV SOLN
INTRAVENOUS | Status: DC | PRN
  Administered 2014-02-10: 12:00:00 via INTRAVENOUS

## 2014-02-10 MED ORDER — MIDAZOLAM HCL 2 MG/2ML IJ SOLN
INTRAMUSCULAR | Status: AC
  Filled 2014-02-10: qty 2

## 2014-02-10 MED ORDER — METOCLOPRAMIDE HCL 5 MG/ML IJ SOLN: 5.0000 mg | Freq: Three times a day (TID) | INTRAMUSCULAR | Status: DC | PRN

## 2014-02-10 MED ORDER — HYDROMORPHONE HCL PF 1 MG/ML IJ SOLN
0.2500 mg | INTRAMUSCULAR | Status: DC | PRN
  Administered 2014-02-10 (×2): 0.5 mg via INTRAVENOUS

## 2014-02-10 MED ORDER — HYDROMORPHONE HCL PF 1 MG/ML IJ SOLN
INTRAMUSCULAR | Status: AC
  Filled 2014-02-10: qty 1

## 2014-02-10 MED ORDER — METOCLOPRAMIDE HCL 10 MG PO TABS: 5.0000 mg | ORAL_TABLET | Freq: Three times a day (TID) | ORAL | Status: DC | PRN

## 2014-02-10 MED ORDER — OXYCODONE HCL 5 MG PO TABS: 5.0000 mg | ORAL_TABLET | Freq: Once | ORAL | Status: DC | PRN

## 2014-02-10 MED ORDER — HYDROCODONE-ACETAMINOPHEN 5-325 MG PO TABS
1.0000 | ORAL_TABLET | ORAL | Status: DC | PRN
  Administered 2014-02-10 – 2014-02-16 (×14): 1 via ORAL
  Filled 2014-02-10 (×4): qty 1
  Filled 2014-02-10: qty 2
  Filled 2014-02-10 (×7): qty 1

## 2014-02-10 MED ORDER — LACTATED RINGERS IV SOLN
INTRAVENOUS | Status: DC
  Administered 2014-02-10: 12:00:00 via INTRAVENOUS

## 2014-02-10 MED ORDER — ONDANSETRON HCL 4 MG/2ML IJ SOLN
INTRAMUSCULAR | Status: AC
  Filled 2014-02-10: qty 2

## 2014-02-10 SURGICAL SUPPLY — 48 items
BANDAGE ELASTIC 6 VELCRO ST LF (GAUZE/BANDAGES/DRESSINGS) ×2 IMPLANT
BLADE SURG 10 STRL SS (BLADE) ×2 IMPLANT
BNDG COHESIVE 4X5 TAN STRL (GAUZE/BANDAGES/DRESSINGS) ×2 IMPLANT
BNDG COHESIVE 6X5 TAN STRL LF (GAUZE/BANDAGES/DRESSINGS) ×4 IMPLANT
BNDG GAUZE ELAST 4 BULKY (GAUZE/BANDAGES/DRESSINGS) ×4 IMPLANT
BNDG GAUZE STRTCH 6 (GAUZE/BANDAGES/DRESSINGS) ×6 IMPLANT
COTTON STERILE ROLL (GAUZE/BANDAGES/DRESSINGS) ×2 IMPLANT
COVER SURGICAL LIGHT HANDLE (MISCELLANEOUS) ×2 IMPLANT
CUFF TOURNIQUET SINGLE 18IN (TOURNIQUET CUFF) IMPLANT
CUFF TOURNIQUET SINGLE 24IN (TOURNIQUET CUFF) IMPLANT
CUFF TOURNIQUET SINGLE 34IN LL (TOURNIQUET CUFF) IMPLANT
CUFF TOURNIQUET SINGLE 44IN (TOURNIQUET CUFF) IMPLANT
DRAPE U-SHAPE 47X51 STRL (DRAPES) ×2 IMPLANT
DRSG ADAPTIC 3X8 NADH LF (GAUZE/BANDAGES/DRESSINGS) ×2 IMPLANT
DRSG PAD ABDOMINAL 8X10 ST (GAUZE/BANDAGES/DRESSINGS) ×4 IMPLANT
DURAPREP 26ML APPLICATOR (WOUND CARE) ×2 IMPLANT
ELECT CAUTERY BLADE 6.4 (BLADE) IMPLANT
ELECT REM PT RETURN 9FT ADLT (ELECTROSURGICAL)
ELECTRODE REM PT RTRN 9FT ADLT (ELECTROSURGICAL) IMPLANT
GAUZE SPONGE 4X4 16PLY XRAY LF (GAUZE/BANDAGES/DRESSINGS) ×2 IMPLANT
GLOVE BIO SURGEON STRL SZ7.5 (GLOVE) ×2 IMPLANT
GLOVE BIO SURGEON STRL SZ8 (GLOVE) ×2 IMPLANT
GLOVE EUDERMIC 7 POWDERFREE (GLOVE) ×2 IMPLANT
GLOVE SS BIOGEL STRL SZ 7.5 (GLOVE) ×1 IMPLANT
GLOVE SUPERSENSE BIOGEL SZ 7.5 (GLOVE) ×1
GOWN STRL REUS W/ TWL LRG LVL3 (GOWN DISPOSABLE) ×1 IMPLANT
GOWN STRL REUS W/ TWL XL LVL3 (GOWN DISPOSABLE) ×2 IMPLANT
GOWN STRL REUS W/TWL LRG LVL3 (GOWN DISPOSABLE) ×1
GOWN STRL REUS W/TWL XL LVL3 (GOWN DISPOSABLE) ×2
HANDPIECE INTERPULSE COAX TIP (DISPOSABLE)
KIT BASIN OR (CUSTOM PROCEDURE TRAY) ×2 IMPLANT
KIT ROOM TURNOVER OR (KITS) ×2 IMPLANT
MANIFOLD NEPTUNE II (INSTRUMENTS) ×2 IMPLANT
NS IRRIG 1000ML POUR BTL (IV SOLUTION) ×2 IMPLANT
PACK ORTHO EXTREMITY (CUSTOM PROCEDURE TRAY) ×2 IMPLANT
PAD ARMBOARD 7.5X6 YLW CONV (MISCELLANEOUS) ×4 IMPLANT
PADDING CAST COTTON 6X4 STRL (CAST SUPPLIES) ×2 IMPLANT
SET HNDPC FAN SPRY TIP SCT (DISPOSABLE) IMPLANT
SPONGE GAUZE 4X4 12PLY (GAUZE/BANDAGES/DRESSINGS) ×2 IMPLANT
SPONGE LAP 18X18 X RAY DECT (DISPOSABLE) ×2 IMPLANT
STOCKINETTE IMPERVIOUS 9X36 MD (GAUZE/BANDAGES/DRESSINGS) ×2 IMPLANT
TOWEL OR 17X24 6PK STRL BLUE (TOWEL DISPOSABLE) ×2 IMPLANT
TOWEL OR 17X26 10 PK STRL BLUE (TOWEL DISPOSABLE) ×2 IMPLANT
TUBE ANAEROBIC SPECIMEN COL (MISCELLANEOUS) IMPLANT
TUBE CONNECTING 12X1/4 (SUCTIONS) ×2 IMPLANT
UNDERPAD 30X30 INCONTINENT (UNDERPADS AND DIAPERS) ×2 IMPLANT
WATER STERILE IRR 1000ML POUR (IV SOLUTION) ×2 IMPLANT
YANKAUER SUCT BULB TIP NO VENT (SUCTIONS) ×2 IMPLANT

## 2014-02-10 NOTE — Progress Notes (Signed)
Dr. Marcie Bal at bedside and aware that pt reports eating a miniature fun size candy bar at 0600 this morning.

## 2014-02-10 NOTE — Transfer of Care (Signed)
Immediate Anesthesia Transfer of Care Note  Patient: Jesse Pope  Procedure(s) Performed: Procedure(s): IRRIGATION AND DEBRIDEMENT EXTREMITY/PRE PATELLA BURSA (Left)  Patient Location: PACU  Anesthesia Type:General  Level of Consciousness: awake, alert , oriented and patient cooperative  Airway & Oxygen Therapy: Patient Spontanous Breathing and Patient connected to nasal cannula oxygen  Post-op Assessment: Report given to PACU RN and Post -op Vital signs reviewed and stable  Post vital signs: Reviewed and stable  Complications: No apparent anesthesia complications

## 2014-02-10 NOTE — Progress Notes (Signed)
Jesse Pope  MRN: [id number] DOB/Age: 1950-11-20 63 y.o. Physician: Ander Slade, M.D.      Subjective: Reports feeling gradually better Vital Signs Temp:  [98.5 F (36.9 C)-102.7 F (39.3 C)] 99.4 F (37.4 C) (07/29 0530) Pulse Rate:  [83-112] 88 (07/29 0530) Resp:  [16] 16 (07/29 0530) BP: (132-138)/(65-81) 134/73 mmHg (07/29 0530) SpO2:  [96 %-98 %] 96 % (07/29 0530)  Lab Results  Recent Labs  02/09/14 0442 02/10/14 0550  WBC 13.4* 15.0*  HGB 12.5* 12.5*  HCT 34.9* 36.2*  PLT 276 311   BMET  Recent Labs  02/09/14 0442 02/10/14 0550  NA 122* 131*  K 4.2 4.8  CL 86* 92*  CO2 23 25  GLUCOSE 93 108*  BUN 17 13  CREATININE 0.95 0.91  CALCIUM 8.4 8.5   No results found for this basename: inr     Exam  Left prepatellar region remains erythematous and indurated. Increased fluctuance  Plan With temp spike last night and elevated WBC will plan for I and D today at lunch. NPO  Jesse Pope M 02/10/2014, 7:42 AM

## 2014-02-10 NOTE — Anesthesia Preprocedure Evaluation (Addendum)
Anesthesia Evaluation  Patient identified by MRN, date of birth, ID band Patient awake    Reviewed: Allergy & Precautions, H&P , NPO status , Patient's Chart, lab work & pertinent test results  History of Anesthesia Complications Negative for: history of anesthetic complications  Airway Mallampati: II TM Distance: >3 FB Neck ROM: full    Dental  (+) Teeth Intact, Dental Advisory Given   Pulmonary Current Smoker,          Cardiovascular hypertension, Pt. on medications     Neuro/Psych negative neurological ROS  negative psych ROS   GI/Hepatic negative GI ROS, Neg liver ROS,   Endo/Other  negative endocrine ROS  Renal/GU negative Renal ROS     Musculoskeletal negative musculoskeletal ROS (+)   Abdominal   Peds  Hematology negative hematology ROS (+)   Anesthesia Other Findings   Reproductive/Obstetrics negative OB ROS                        Anesthesia Physical Anesthesia Plan  ASA: II  Anesthesia Plan: General   Post-op Pain Management:    Induction: Intravenous  Airway Management Planned: Oral ETT  Additional Equipment:   Intra-op Plan:   Post-operative Plan: Extubation in OR  Informed Consent: I have reviewed the patients History and Physical, chart, labs and discussed the procedure including the risks, benefits and alternatives for the proposed anesthesia with the patient or authorized representative who has indicated his/her understanding and acceptance.     Plan Discussed with: CRNA, Anesthesiologist and Surgeon  Anesthesia Plan Comments:         Anesthesia Quick Evaluation

## 2014-02-10 NOTE — Progress Notes (Signed)
[facility] Progress Note   Jesse Pope:[id number] DOB: [birth year] DOA: 02/08/2014 PCP:  Melinda Crutch, MD  Brief narrative: Jesse Pope is a 63 y.o. male  presents with prepatellar bursitis and cellulitis left leg   Subjective: Having significant pain in leg after I and D  Assessment/Plan: Septic prepatellar bursitis of left knee -MRSA--Left lower extremity cellulitis --sepsis -Status post aspiration per orthopedic/Dr. supple on 7/27  -s/p  I&D  - cont Vanc -will need to discuss duration of tx with ID -Continue pain management - added Vicodin as pt had no oral pain meds ordered  Active Problems:   Hyponatremia  -No significant change with fluid restriction on admission.  -Urine sodium is less than 20, more consistent with volume depletion  -improved with hydration- cont to hydrate and follow  Alcohol abuse  -Continue CIWA protocol, no evidence of withdrawal   Tobacco abuse  -Counseled to stop smoking  HTN Cont Ramapril    Code Status: Full code Family Communication: with wife Disposition Plan: to be determined  Consultants: ortho  Procedures: I and D 7/29  Antibiotics: Anti-infectives   Start     Dose/Rate Route Frequency Ordered Stop   02/09/14 0200  vancomycin (VANCOCIN) IVPB 750 mg/150 ml premix     750 mg 150 mL/hr over 60 Minutes Intravenous Every 12 hours 02/08/14 1439     02/08/14 1245  vancomycin (VANCOCIN) 1,500 mg in sodium chloride 0.9 % 500 mL IVPB     1,500 mg 250 mL/hr over 120 Minutes Intravenous  Once 02/08/14 1243 02/08/14 1525       DVT prophylaxis: Heparin  Objective: Filed Weights   02/08/14 1414  Weight: 69.809 kg (153 lb 14.4 oz)    Intake/Output Summary (Last 24 hours) at 02/10/14 1720 Last data filed at 02/10/14 1407  Gross per 24 hour  Intake    800 ml  Output     30 ml  Net    770 ml     Vitals Filed Vitals:   02/10/14 1356 02/10/14 1400 02/10/14 1406 02/10/14 1415  BP: 141/85   136/74   Pulse: 85 86    Temp:   98.3 F (36.8 C) 98.4 F (36.9 C)  TempSrc:      Resp: 13 13  16   Height:      Weight:      SpO2: 95% 96%  96%    Exam: General: No acute respiratory distress Lungs: Clear to auscultation bilaterally without wheezes or crackles Cardiovascular: Regular rate and rhythm without murmur gallop or rub normal S1 and S2 Abdomen: Nontender, nondistended, soft, bowel sounds positive, no rebound, no ascites, no appreciable mass Extremities: No significant cyanosis, clubbing- right leg in dressing  Data Reviewed: Basic Metabolic Panel:  Recent Labs Lab 02/08/14 1000 02/08/14 1345 02/09/14 0442 02/10/14 0550  NA 121*  --  122* 131*  K 5.2  --  4.2 4.8  CL 84*  --  86* 92*  CO2 19  --  23 25  GLUCOSE 101*  --  93 108*  BUN 12  --  17 13  CREATININE 1.17 1.10 0.95 0.91  CALCIUM 8.9  --  8.4 8.5   Liver Function Tests:  Recent Labs Lab 02/08/14 1345 02/09/14 0442  AST 18 28  ALT 14 19  ALKPHOS 82 70  BILITOT 1.3* 1.0  PROT 6.6 6.6  ALBUMIN 2.7* 2.6*   No results found for this basename: LIPASE, AMYLASE,  in the last 168 hours  No results found for this basename: AMMONIA,  in the last 168 hours CBC:  Recent Labs Lab 02/08/14 1000 02/08/14 1345 02/09/14 0442 02/10/14 0550  WBC 16.9* 15.1* 13.4* 15.0*  NEUTROABS 13.9*  --   --   --   HGB 14.1 12.6* 12.5* 12.5*  HCT 39.2 36.4* 34.9* 36.2*  MCV 91.0 91.0 89.5 90.7  PLT 252 235 276 311   Cardiac Enzymes:  Recent Labs Lab 02/08/14 1000  CKTOTAL 32   BNP (last 3 results) No results found for this basename: PROBNP,  in the last 8760 hours CBG: No results found for this basename: GLUCAP,  in the last 168 hours  Recent Results (from the past 240 hour(s))  CULTURE, BLOOD (ROUTINE X 2)     Status: None   Collection Time    02/08/14 10:00 AM      Result Value Ref Range Status   Specimen Description BLOOD RIGHT ANTECUBITAL   Final   Special Requests BOTTLES DRAWN AEROBIC AND ANAEROBIC 6CC    Final   Culture  Setup Time     Final   Value: 02/08/2014 16:15     Performed at [facility]   Culture     Final   Value:        BLOOD CULTURE RECEIVED NO GROWTH TO DATE CULTURE WILL BE HELD FOR 5 DAYS BEFORE ISSUING A FINAL NEGATIVE REPORT     Performed at [facility]   Report Status PENDING   Incomplete  CULTURE, BLOOD (ROUTINE X 2)     Status: None   Collection Time    02/08/14 10:05 AM      Result Value Ref Range Status   Specimen Description BLOOD LEFT FOREARM   Final   Special Requests BOTTLES DRAWN AEROBIC AND ANAEROBIC 6CC   Final   Culture  Setup Time     Final   Value: 02/08/2014 16:15     Performed at [facility]   Culture     Final   Value:        BLOOD CULTURE RECEIVED NO GROWTH TO DATE CULTURE WILL BE HELD FOR 5 DAYS BEFORE ISSUING A FINAL NEGATIVE REPORT     Performed at [facility]   Report Status PENDING   Incomplete  BODY FLUID CULTURE     Status: None   Collection Time    02/08/14 12:25 PM      Result Value Ref Range Status   Specimen Description SYNOVIAL FLUID LEFT KNEE   Final   Special Requests NONE   Final   Gram Stain     Final   Value: ABUNDANT WBC PRESENT,BOTH PMN AND MONONUCLEAR     ABUNDANT GRAM POSITIVE COCCI     IN PAIRS IN CLUSTERS Performed at [facility]     Performed at [facility]   Culture     Final   Value: ABUNDANT METHICILLIN RESISTANT STAPHYLOCOCCUS AUREUS     BUMPER 02/10/14 0825 BY SMITHERSJ Note: RIFAMPIN AND GENTAMICIN SHOULD NOT BE USED AS SINGLE DRUGS FOR TREATMENT OF STAPH INFECTIONS. This organism DOES NOT demonstrate inducible Clindamycin resistance in vitro. CRITICAL RESULT CALLED TO, READ BACK BY AND      VERIFIED WITH: KAREN LOWE     Performed at [facility]   Report Status 02/10/2014 FINAL   Final   Organism ID, Bacteria METHICILLIN RESISTANT STAPHYLOCOCCUS AUREUS   Final  GRAM STAIN     Status: None   Collection  Time    02/08/14 12:25 PM      Result Value Ref  Range Status   Specimen Description SYNOVIAL FLUID LEFT KNEE   Final   Special Requests NONE   Final   Gram Stain     Final   Value: ABUNDANT WBC PRESENT,BOTH PMN AND MONONUCLEAR     ABUNDANT GRAM POSITIVE COCCI IN PAIRS IN CLUSTERS   Report Status 02/08/2014 FINAL   Final     Studies:  Recent x-ray studies have been reviewed in detail by the Attending Physician  Scheduled Meds:  Scheduled Meds: . amLODipine  10 mg Oral Daily  . folic acid  1 mg Oral Daily  . heparin  5,000 Units Subcutaneous 3 times per day  . HYDROmorphone      . LORazepam  0-4 mg Intravenous Q12H  . multivitamin with minerals  1 tablet Oral Daily  . ramipril  5 mg Oral Daily  . thiamine  100 mg Oral Daily   Or  . thiamine  100 mg Intravenous Daily  . vancomycin  750 mg Intravenous Q12H   Continuous Infusions: . sodium chloride 75 mL/hr at 02/09/14 1431    Time spent on care of this patient: 54 min   [place], MD 02/10/2014, 5:20 PM  LOS: 2 days   [facility] Office  [phone] Pager - Text Page per [email or web address]  If 7PM-7AM, please contact night-coverage [email or web address]

## 2014-02-10 NOTE — Op Note (Signed)
02/08/2014 - 02/10/2014  12:59 PM  PATIENT:   Jesse Pope  63 y.o. male  PRE-OPERATIVE DIAGNOSIS:  Infected Left Knee prepatellar bursae  POST-OPERATIVE DIAGNOSIS:  same  PROCEDURE:  I and D  SURGEON:  Ayari Liwanag, Metta Clines. M.D.  ASSISTANTS: none   ANESTHESIA:   LMA general  EBL: minimal  SPECIMEN:  none  Drains: none   PATIENT DISPOSITION:  PACU - hemodynamically stable.    PLAN OF CARE: Admit to inpatient   Dictation# [id number]

## 2014-02-10 NOTE — Progress Notes (Signed)
Dear Doctor: This patient has been identified as a candidate for PICC for the following reason (s): drug pH or osmolality (causing phlebitis, infiltration in 24 hours) If you agree, please write an order for the indicated device. For any questions contact the Vascular Access Team at [phone] if no answer, please leave a message.  Thank you for supporting the early vascular access assessment program. Naylah Cork M  

## 2014-02-10 NOTE — Progress Notes (Signed)
Report given to philip rn as caregiver 

## 2014-02-11 ENCOUNTER — Encounter (HOSPITAL_COMMUNITY): Payer: Self-pay | Admitting: Orthopedic Surgery

## 2014-02-11 DIAGNOSIS — I10 Essential (primary) hypertension: Secondary | ICD-10-CM | POA: Diagnosis present

## 2014-02-11 DIAGNOSIS — Z85828 Personal history of other malignant neoplasm of skin: Secondary | ICD-10-CM | POA: Diagnosis present

## 2014-02-11 DIAGNOSIS — Z720 Tobacco use: Secondary | ICD-10-CM | POA: Diagnosis present

## 2014-02-11 DIAGNOSIS — F17210 Nicotine dependence, cigarettes, uncomplicated: Secondary | ICD-10-CM

## 2014-02-11 DIAGNOSIS — M7040 Prepatellar bursitis, unspecified knee: Secondary | ICD-10-CM

## 2014-02-11 DIAGNOSIS — A4902 Methicillin resistant Staphylococcus aureus infection, unspecified site: Secondary | ICD-10-CM

## 2014-02-11 DIAGNOSIS — L309 Dermatitis, unspecified: Secondary | ICD-10-CM | POA: Diagnosis present

## 2014-02-11 DIAGNOSIS — C44722 Squamous cell carcinoma of skin of right lower limb, including hip: Secondary | ICD-10-CM | POA: Diagnosis present

## 2014-02-11 HISTORY — DX: Dermatitis, unspecified: L30.9

## 2014-02-11 HISTORY — DX: Nicotine dependence, cigarettes, uncomplicated: F17.210

## 2014-02-11 LAB — BASIC METABOLIC PANEL
Anion gap: 13 (ref 5–15)
BUN: 10 mg/dL (ref 6–23)
CO2: 26 mEq/L (ref 19–32)
Calcium: 8.3 mg/dL — ABNORMAL LOW (ref 8.4–10.5)
Chloride: 91 mEq/L — ABNORMAL LOW (ref 96–112)
Creatinine, Ser: 0.84 mg/dL (ref 0.50–1.35)
GFR calc Af Amer: >90 mL/min (ref >90)
GFR calc non Af Amer: >90 mL/min (ref >90)
Glucose, Bld: 104 mg/dL — ABNORMAL HIGH (ref 70–99)
Potassium: 4.1 mEq/L (ref 3.7–5.3)
Sodium: 130 mEq/L — ABNORMAL LOW (ref 137–147)

## 2014-02-11 LAB — CBC
HCT: 34.4 % — ABNORMAL LOW (ref 39.0–52.0)
Hemoglobin: 12 g/dL — ABNORMAL LOW (ref 13.0–17.0)
MCH: 31.5 pg (ref 26.0–34.0)
MCHC: 34.9 g/dL (ref 30.0–36.0)
MCV: 90.3 fL (ref 78.0–100.0)
Platelets: 329 10*3/uL (ref 150–400)
RBC: 3.81 MIL/uL — ABNORMAL LOW (ref 4.22–5.81)
RDW: 13 % (ref 11.5–15.5)
WBC: 12.1 10*3/uL — ABNORMAL HIGH (ref 4.0–10.5)

## 2014-02-11 MED ORDER — VANCOMYCIN HCL 10 G IV SOLR
1250.0000 mg | Freq: Two times a day (BID) | INTRAVENOUS | Status: DC
  Administered 2014-02-11 – 2014-02-14 (×7): 1250 mg via INTRAVENOUS
  Filled 2014-02-11 (×8): qty 1250

## 2014-02-11 NOTE — Progress Notes (Signed)
Jesse Pope  MRN: [id number] DOB/Age: [birth year] 63 y.o. Physician: Ander Slade, M.D. 1 Day Post-Op Procedure(s) (LRB): IRRIGATION AND DEBRIDEMENT EXTREMITY/PRE PATELLA BURSA (Left)  Subjective: Resting comfortably, feeling better today Vital Signs Temp:  [98 F (36.7 C)-98.4 F (36.9 C)] 98.1 F (36.7 C) (07/30 1515) Pulse Rate:  [85-98] 88 (07/30 1515) Resp:  [18] 18 (07/30 1515) BP: (138-150)/(74-88) 149/79 mmHg (07/30 1515) SpO2:  [97 %-98 %] 98 % (07/30 1515)  Lab Results  Recent Labs  02/10/14 0550 02/11/14 0654  WBC 15.0* 12.1*  HGB 12.5* 12.0*  HCT 36.2* 34.4*  PLT 311 329   BMET  Recent Labs  02/10/14 0550 02/11/14 0654  NA 131* 130*  K 4.8 4.1  CL 92* 91*  CO2 25 26  GLUCOSE 108* 104*  BUN 13 10  CREATININE 0.91 0.84  CALCIUM 8.5 8.3*   No results found for this basename: inr     Exam  Extensive bloody drainage in dressings, packing removed with scant devitalized tissue and purulence, skin flaps viable, erythema and induration much improved.  Plan Begin NS damp to dry dressing changes BID. Will follow wound progress and determine if delayed wound closure necessary  [name] M 02/11/2014, 3:16 PM

## 2014-02-11 NOTE — Consult Note (Signed)
[facility]    Date of Admission:  02/08/2014           Day 4 vancomycin       Reason for Consult: Bursa prepatellar septic bursitis of left knee    Referring Physician: Dr. Debbe Odea  Principal Problem:   Septic infrapatellar bursitis of left knee Active Problems:   Hyponatremia   HTN (hypertension)   Cigarette smoker   Eczema   Basal cell carcinoma of lower leg   . amLODipine  10 mg Oral Daily  . folic acid  1 mg Oral Daily  . heparin  5,000 Units Subcutaneous 3 times per day  . multivitamin with minerals  1 tablet Oral Daily  . ramipril  5 mg Oral Daily  . thiamine  100 mg Oral Daily  . vancomycin  1,250 mg Intravenous Q12H    Recommendations: 1. Continue vancomycin for now   Assessment: He has MRSA septic prepatellar bursitis. He is doing better after 4 days of IV antibiotics and surgical drainage of the bursa. There are no prospective controlled trials of therapy for septic bursitis but generally we will treat with 2-3 weeks of total antibiotics. It is often reasonable to transition from IV antibiotics to oral after 5-7 days, especially if the drainage has been achieved, as it has in this case. I will consider conversion to an oral antibiotic regimen in the next 48 hours.    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 Over the next 24 hours he developed progressive pain, redness and swelling and could not work. He was admitted 4 days ago. Culture of an aspirate of the left prepatellar bursa has grown MRSA. Yesterday he underwent incision and drainage with bursectomy. He is feeling better. He spends a great deal of time kneeling during his work. He is also being treated for chronic eczema and a large basal cell cancer on his right shin. His skin conditions and repetitive trauma to the knee could easily serve as risk factors for septic bursitis.   Review of  Systems: Pertinent items are noted in HPI.  Past Medical History  Diagnosis Date  . Basal cell carcinoma of right lower leg   . Hypertension     History  Substance Use Topics  . Smoking status: Current Every Day Smoker  . Smokeless tobacco: Not on file  . Alcohol Use: Yes    History reviewed. No pertinent family history. Allergies  Allergen Reactions  . Penicillins Rash    OBJECTIVE: Blood pressure 150/88, pulse 85, temperature 98 F (36.7 C), temperature source Oral, resp. rate 18, height 5\' 11"  (1.803 m), weight 69.809 kg (153 lb 14.4 oz), SpO2 97.00%. General: He is in good spirits and in no distress watching television Skin: Scattered dry, scaly patches compatible with eczema. Large area of erythematous nodules on the left shin Lungs: Clear Cor: Regular S1 and S2 no murmurs Abdomen: Soft and nontender Joints and extremities: His left knee is wrapped. The distal phalanx of his right index finger has been amputated  Lab Results Lab Results  Component Value Date   WBC 12.1* 02/11/2014   HGB 12.0* 02/11/2014   HCT 34.4* 02/11/2014   MCV 90.3 02/11/2014   PLT 329 02/11/2014    Lab Results  Component Value Date   CREATININE 0.84 02/11/2014   BUN 10 02/11/2014   NA  130* 02/11/2014   K 4.1 02/11/2014   CL 91* 02/11/2014   CO2 26 02/11/2014    Lab Results  Component Value Date   ALT 19 02/09/2014   AST 28 02/09/2014   ALKPHOS 70 02/09/2014   BILITOT 1.0 02/09/2014     Microbiology: Recent Results (from the past 240 hour(s))  CULTURE, BLOOD (ROUTINE X 2)     Status: None   Collection Time    02/08/14 10:00 AM      Result Value Ref Range Status   Specimen Description BLOOD RIGHT ANTECUBITAL   Final   Special Requests BOTTLES DRAWN AEROBIC AND ANAEROBIC [facility]   Final   Culture  Setup Time     Final   Value: 02/08/2014 16:15     Performed at [facility]   Culture     Final   Value:        BLOOD CULTURE RECEIVED NO GROWTH TO DATE CULTURE WILL BE HELD FOR 5 DAYS  BEFORE ISSUING A FINAL NEGATIVE REPORT     Performed at [facility]   Report Status PENDING   Incomplete  CULTURE, BLOOD (ROUTINE X 2)     Status: None   Collection Time    02/08/14 10:05 AM      Result Value Ref Range Status   Specimen Description BLOOD LEFT FOREARM   Final   Special Requests BOTTLES DRAWN AEROBIC AND ANAEROBIC 6CC   Final   Culture  Setup Time     Final   Value: 02/08/2014 16:15     Performed at [facility]   Culture     Final   Value:        BLOOD CULTURE RECEIVED NO GROWTH TO DATE CULTURE WILL BE HELD FOR 5 DAYS BEFORE ISSUING A FINAL NEGATIVE REPORT     Performed at [facility]   Report Status PENDING   Incomplete  BODY FLUID CULTURE     Status: None   Collection Time    02/08/14 12:25 PM      Result Value Ref Range Status   Specimen Description SYNOVIAL FLUID LEFT KNEE   Final   Special Requests NONE   Final   Gram Stain     Final   Value: ABUNDANT WBC PRESENT,BOTH PMN AND MONONUCLEAR     ABUNDANT GRAM POSITIVE COCCI     IN PAIRS IN CLUSTERS Performed at [facility]     Performed at [facility]   Culture     Final   Value: ABUNDANT METHICILLIN RESISTANT STAPHYLOCOCCUS AUREUS     BUMPER 02/10/14 0825 BY SMITHERSJ Note: RIFAMPIN AND GENTAMICIN SHOULD NOT BE USED AS SINGLE DRUGS FOR TREATMENT OF STAPH INFECTIONS. This organism DOES NOT demonstrate inducible Clindamycin resistance in vitro. CRITICAL RESULT CALLED TO, READ BACK BY AND      VERIFIED WITH: KAREN LOWE     Performed at [facility]   Report Status 02/10/2014 FINAL   Final   Organism ID, Bacteria METHICILLIN RESISTANT STAPHYLOCOCCUS AUREUS   Final  GRAM STAIN     Status: None   Collection Time    02/08/14 12:25 PM      Result Value Ref Range Status   Specimen Description SYNOVIAL FLUID LEFT KNEE   Final   Special Requests NONE   Final   Gram Stain     Final   Value: ABUNDANT WBC PRESENT,BOTH PMN AND MONONUCLEAR     ABUNDANT GRAM POSITIVE COCCI  IN  PAIRS IN CLUSTERS   Report Status 02/08/2014 FINAL   Final    Michel Bickers, MD [place] [facility] [phone] pager   [phone] cell 02/11/2014, 2:24 PM

## 2014-02-11 NOTE — Progress Notes (Signed)
ANTIBIOTIC CONSULT NOTE - Follow Up   Pharmacy Consult for vancomycin Indication: cellulitis  Allergies  Allergen Reactions  . Penicillins Rash    Patient Measurements: Height: 5\' 11"  (180.3 cm) Weight: 153 lb 14.4 oz (69.809 kg) IBW/kg (Calculated) : 75.3  Body Weight: 70.9 kg  Vital Signs: Temp: 98 F (36.7 C) (07/30 0552) Temp src: Oral (07/30 0552) BP: 150/88 mmHg (07/30 0552) Pulse Rate: 85 (07/30 0552) Intake/Output from previous day: 07/29 0701 - 07/30 0700 In: 1220 [P.O.:120; I.V.:1100] Out: 1155 [Urine:1125; Blood:30] Intake/Output from this shift:    Labs:  Recent Labs  02/09/14 0442 02/10/14 0550 02/11/14 0654  WBC 13.4* 15.0* 12.1*  HGB 12.5* 12.5* 12.0*  PLT 276 311 329  CREATININE 0.95 0.91 0.84   Estimated Creatinine Clearance: 88.9 ml/min (by C-G formula based on Cr of 0.84).  Recent Labs  02/10/14 1510  VANCOTROUGH 7.6*     Medications:  See med history  Assessment: 63 yo man on vancomycin for septic prepatellar bursitis of left knee and LLE cellulitis. A vancomycin trough drawn yesterday was sub-therapeutic at 7.6. Patient is s/p an I&D on 7/30. His WBC has trended down today and he is afebrile. Cultures as shown below  Vanc 7/27>>  7/27 BCx2>>NGTD 7/27 knee fluid >> MRSA   Goal of Therapy:  Vancomycin trough level 10-15 mcg/ml  Plan:  Increase Vancomycin to 1250 mg IV q12 hours F/u renal function, cultures and clinical course F/u ID recommendations on length of therapy   Albertina Parr, PharmD.  Clinical Pharmacist Pager [phone]

## 2014-02-11 NOTE — Op Note (Signed)
NAME:  Jesse Pope, POLIDORI NO.:  [id number]  MEDICAL RECORD NO.:  [id number]  LOCATION:  [id number]                        FACILITY:  [facility]  PHYSICIAN:  Metta Clines. Michale Weikel, M.D.  DATE OF BIRTH:  [birth year]  DATE OF PROCEDURE:  02/10/2014 DATE OF DISCHARGE:                              OPERATIVE REPORT   PREOPERATIVE DIAGNOSIS:  Septic arthritis, left knee prepatellar bursa.  POSTOPERATIVE DIAGNOSIS:  Septic arthritis, left knee prepatellar bursa.  PROCEDURE:  Incision and drainage with debridement of septic left knee prepatellar bursa.  Excisional debridement, utilizing sharp dissection as well as mechanical debridement with surgical sponges.  SURGEON:  Metta Clines. Chanley Mcenery, MD  ASSISTANT:  None.  ANESTHESIA:  LMA general.  TOURNIQUET TIME:  None was used.  ESTIMATED BLOOD LOSS:  Minimal.  DRAINS:  None.  HISTORY:  Jesse Pope is a 63 year old gentleman who has had greater than 1 week of progressive increasing left knee pain and swelling with erythema, which has now extended proximally and distally involving the entirety of the left lower extremity.  He was admitted to the hospital this past Monday with initial aspiration performed in the prepatellar bursa showing gross purulence, culture showing Staph aureus.  He initially defervesced, but last night began spiking temperatures, and exam this morning shows significant increased fluid and swelling about the left knee, and he is now brought to the operating room for planned surgical incision and drainage with debridement.  Preoperatively, I counseled Mr. Hemp regarding treatment options and risks versus benefits thereof.  Possible surgical complications of bleeding, infection, neurovascular injury, persistent pain, and recurrence of infection were all reviewed.  He understands and accepts and agrees with plan.  PROCEDURE IN DETAIL:  After undergoing routine preop evaluation, the patient was continued on his  therapeutic antibiotics in the form of vancomycin.  Brought to the operating room, placed supine on the operating table, underwent smooth induction of an LMA general anesthesia.  The left lower extremity was then sterilely prepped and draped in standard fashion.  Time-out was called.  I made an anterior longitudinal midline incision 10 cm in length starting in the mid patellar body and extending towards the tibial tubercle.  Skin flaps were elevated.  This dissection was carried down into the prepatellar bursal sac, which was quite fluctuant and prominent, and immediately a large amount of purulent material was evacuated.  I opened up the bursal sac for the length of the incision and performed blunt dissection digitally beneath the skin flaps mobilizing and dissecting adhesions with loculations throughout the entirety of the subacromial/subdeltoid bursal space removing all loculations and then used a combination of sharp dissection, mechanical debridement with a sponge to remove the devitalized tissues and necrotic tissues that were found throughout this area.  The focus of the abscess appeared to be at the region of the infrapatellar tendon at the inferior pole of the patella, and there was some necrotic tissue in this area, which was debrided.  We then confirmed that all of the loculations had been completely divided and progressively dissected bluntly in the prepatellar bursal space.  Thus we were convinced that all loculations were released.  I then performed final debridement with sharp  dissection including scissors and scalpel evacuating the debris and necrotic tissue.  The abscess cavity was then copiously irrigated.  Hemostasis was obtained.  Then, the wound was then packed with a moistened Kerlix gauze.  A bulky dry dressing was then placed anteriorly, it was wrapped with Kerlix and Ace bandage from foot to the thigh.  The patient was then awakened, extubated, and taken to the  recovery room in stable condition.     Metta Clines. Cloy Cozzens, M.D.     KMS/MEDQ  D:  02/10/2014  T:  02/10/2014  Job:  [id number]

## 2014-02-11 NOTE — Anesthesia Postprocedure Evaluation (Signed)
Anesthesia Post Note  Patient: Jesse Pope  Procedure(s) Performed: Procedure(s) (LRB): IRRIGATION AND DEBRIDEMENT EXTREMITY/PRE PATELLA BURSA (Left)  Anesthesia type: General  Patient location: PACU  Post pain: Pain level controlled and Adequate analgesia  Post assessment: Post-op Vital signs reviewed, Patient's Cardiovascular Status Stable, Respiratory Function Stable, Patent Airway and Pain level controlled  Last Vitals:  Filed Vitals:   02/11/14 0552  BP: 150/88  Pulse: 85  Temp: 36.7 C  Resp: 18    Post vital signs: Reviewed and stable  Level of consciousness: awake, alert  and oriented  Complications: No apparent anesthesia complications

## 2014-02-11 NOTE — Evaluation (Signed)
Physical Therapy Evaluation and Discharge Patient Details Name: Jesse Pope MRN: [id number] DOB: [birth year] Today's Date: 02/11/2014   History of Present Illness  63 y.o. male admitted with septic prepatellar bursitis of left knee -MRSA--Left lower extremity cellulitis --sepsis. Now s/p I&D of LLE.  Clinical Impression  Patient evaluated by Physical Therapy with no further acute PT needs identified. All education has been completed and the patient has no further questions. Patient has safely completed stair and gait training with the assist of a walker and has no further questions concerning these tasks. Tolerated and verbalizes understanding of therapeutic exercises to maintain strength and ROM. See below for any follow-up Physial Therapy or equipment needs. PT is signing off. Thank you for this referral.    Follow Up Recommendations Outpatient PT    Equipment Recommendations  Rolling walker with 5" wheels;3in1 (PT)    Recommendations for Other Services OT consult     Precautions / Restrictions Precautions Precautions: None Restrictions Weight Bearing Restrictions: No      Mobility  Bed Mobility Overal bed mobility: Modified Independent                Transfers Overall transfer level: Needs assistance Equipment used: Rolling walker (2 wheeled) Transfers: Sit to/from Stand Sit to Stand: Supervision         General transfer comment: Supervision for safety. VC for hand placement. Performed from lowest bed setting x2 and recliner x2. Encouraged to increase WB through LLE as he was performing NWB.  Ambulation/Gait Ambulation/Gait assistance: Min guard Ambulation Distance (Feet): 60 Feet Assistive device: Rolling walker (2 wheeled) Gait Pattern/deviations: Step-through pattern;Decreased step length - right;Decreased stance time - left;Antalgic   Gait velocity interpretation: Below normal speed for age/gender General Gait Details: Educated on safe DME use with  rolling walker. VC for sequencing. Encouraged to increase WB through LLE for feedback and balance. Minimal balance deficits but improves with increased WB for feedback.  Stairs Stairs: Yes Stairs assistance: Min guard Stair Management: No rails;Step to pattern;Forwards;With walker;Backwards Number of Stairs: 1 (x2) General stair comments: Demonstrated prior to having pt practice. VC for technique. performed forward and backwards to ascend. Demonstrates safe understanding.  Wheelchair Mobility    Modified Rankin (Stroke Patients Only)       Balance Overall balance assessment: Needs assistance Sitting-balance support: No upper extremity supported;Feet supported Sitting balance-Leahy Scale: Good     Standing balance support: No upper extremity supported Standing balance-Leahy Scale: Fair                               Pertinent Vitals/Pain Reports pain is increasing with mobility. Nurse notified and is aware Patient repositioned in chair for comfort.     Home Living Family/patient expects to be discharged to:: Private residence Living Arrangements: Spouse/significant other Available Help at Discharge: Friend(s);Available 24 hours/day Type of Home: House Home Access: Stairs to enter Entrance Stairs-Rails: Right Entrance Stairs-Number of Steps: 1 Home Layout: Two level;Able to live on main level with bedroom/bathroom;Laundry or work area in [place]: Kasandra Knudsen - single point      Prior Function Level of Independence: Independent with assistive device(s)         Comments: Used cane for ambulation intermittently. Could bath/dress without assist.     Hand Dominance   Dominant Hand: Right    Extremity/Trunk Assessment   Upper Extremity Assessment: Defer to OT evaluation  Lower Extremity Assessment: LLE deficits/detail   LLE Deficits / Details: Decreased strength and ROM as expected post op     Communication   Communication:  No difficulties  Cognition Arousal/Alertness: Awake/alert Behavior During Therapy: WFL for tasks assessed/performed Overall Cognitive Status: Within Functional Limits for tasks assessed                      General Comments      Exercises General Exercises - Lower Extremity Ankle Circles/Pumps: AROM;Both;10 reps;Seated Quad Sets: AROM;Left;10 reps;Seated Long Arc Quad: AROM;Left;5 reps;Seated Heel Slides: AROM;Left;5 reps;Seated      Assessment/Plan    PT Assessment All further PT needs can be met in the next venue of care  PT Diagnosis Difficulty walking;Abnormality of gait;Acute pain   PT Problem List Decreased strength;Decreased range of motion;Decreased activity tolerance;Decreased balance;Decreased mobility;Decreased knowledge of use of DME;Pain  PT Treatment Interventions     PT Goals (Current goals can be found in the Care Plan section) Acute Rehab PT Goals Patient Stated Goal: None stated PT Goal Formulation: No goals set, d/c therapy    Frequency     Barriers to discharge        Co-evaluation               End of Session   Activity Tolerance: Patient tolerated treatment well Patient left: in chair;with call bell/phone within reach;with family/visitor present Nurse Communication: Mobility status         Time: 7026-3785 PT Time Calculation (min): 40 min   Charges:   PT Evaluation $Initial PT Evaluation Tier I: 1 Procedure PT Treatments $Gait Training: 8-22 mins $Therapeutic Exercise: 8-22 mins   PT G Codes:         Elayne Snare, [place]  Ellouise Newer 02/11/2014, 11:49 AM

## 2014-02-11 NOTE — Progress Notes (Signed)
[facility] Progress Note   Jesse Pope NFA:[id number] DOB: 1950/12/01 DOA: 02/08/2014 PCP:  Melinda Crutch, MD  Brief narrative: Jesse Pope is a 63 y.o. male  presents with prepatellar bursitis and cellulitis left leg   Subjective: Pain somewhat improved. Awaiting PT eval. Wanting to get out of bed.   Assessment/Plan: Septic prepatellar bursitis of left knee -MRSA--Left lower extremity cellulitis --sepsis -Status post aspiration per orthopedic/Dr. supple on 7/27  -s/p  I&D  - cont Vanc -I  have requested an ID eval -Continue pain management - added Vicodin as pt had no oral pain meds ordered - needs outpt PT per Pt notes  Active Problems:   Hyponatremia  - sodium or 121 on admission- may be due to beer drinking -No significant change with fluid restriction on admission.  -Urine sodium is less than 20, more consistent with volume depletion  -improved to 130 with hydration - will d/c IVF now  Alcohol abuse  - will d/c CIWA protocol now as no evidence of withdrawal   Tobacco abuse  -Counseled to stop smoking  HTN Cont Ramapril    Code Status: Full code Family Communication: with wife Disposition Plan: to be determined-   Consultants: ortho  Procedures: I and D 7/29  Antibiotics: Anti-infectives   Start     Dose/Rate Route Frequency Ordered Stop   02/11/14 1400  vancomycin (VANCOCIN) 1,250 mg in sodium chloride 0.9 % 250 mL IVPB     1,250 mg 166.7 mL/hr over 90 Minutes Intravenous Every 12 hours 02/11/14 0808     02/09/14 0200  vancomycin (VANCOCIN) IVPB 750 mg/150 ml premix  Status:  Discontinued     750 mg 150 mL/hr over 60 Minutes Intravenous Every 12 hours 02/08/14 1439 02/11/14 0808   02/08/14 1245  vancomycin (VANCOCIN) 1,500 mg in sodium chloride 0.9 % 500 mL IVPB     1,500 mg 250 mL/hr over 120 Minutes Intravenous  Once 02/08/14 1243 02/08/14 1525       DVT prophylaxis: Heparin  Objective: Filed Weights   02/08/14 1414   Weight: 69.809 kg (153 lb 14.4 oz)    Intake/Output Summary (Last 24 hours) at 02/11/14 1256 Last data filed at 02/11/14 4696  Gross per 24 hour  Intake   1220 ml  Output   1155 ml  Net     65 ml     Vitals Filed Vitals:   02/10/14 1415 02/10/14 2142 02/11/14 0135 02/11/14 0552  BP: 136/74 148/80 138/74 150/88  Pulse:  95 98 85  Temp: 98.4 F (36.9 C) 98.1 F (36.7 C) 98.4 F (36.9 C) 98 F (36.7 C)  TempSrc:  Oral Oral Oral  Resp: 16 18 18 18   Height:      Weight:      SpO2: 96% 97% 97% 97%    Exam: General: No acute respiratory distress Lungs: Clear to auscultation bilaterally without wheezes or crackles Cardiovascular: Regular rate and rhythm without murmur gallop or rub normal S1 and S2 Abdomen: Nontender, nondistended, soft, bowel sounds positive, no rebound, no ascites, no appreciable mass Extremities: No significant cyanosis, clubbing- right leg in dressing  Data Reviewed: Basic Metabolic Panel:  Recent Labs Lab 02/08/14 1000 02/08/14 1345 02/09/14 0442 02/10/14 0550 02/11/14 0654  NA 121*  --  122* 131* 130*  K 5.2  --  4.2 4.8 4.1  CL 84*  --  86* 92* 91*  CO2 19  --  23 25 26   GLUCOSE 101*  --  93 108* 104*  BUN 12  --  17 13 10   CREATININE 1.17 1.10 0.95 0.91 0.84  CALCIUM 8.9  --  8.4 8.5 8.3*   Liver Function Tests:  Recent Labs Lab 02/08/14 1345 02/09/14 0442  AST 18 28  ALT 14 19  ALKPHOS 82 70  BILITOT 1.3* 1.0  PROT 6.6 6.6  ALBUMIN 2.7* 2.6*   No results found for this basename: LIPASE, AMYLASE,  in the last 168 hours No results found for this basename: AMMONIA,  in the last 168 hours CBC:  Recent Labs Lab 02/08/14 1000 02/08/14 1345 02/09/14 0442 02/10/14 0550 02/11/14 0654  WBC 16.9* 15.1* 13.4* 15.0* 12.1*  NEUTROABS 13.9*  --   --   --   --   HGB 14.1 12.6* 12.5* 12.5* 12.0*  HCT 39.2 36.4* 34.9* 36.2* 34.4*  MCV 91.0 91.0 89.5 90.7 90.3  PLT 252 235 276 311 329   Cardiac Enzymes:  Recent Labs Lab  02/08/14 1000  CKTOTAL 32   BNP (last 3 results) No results found for this basename: PROBNP,  in the last 8760 hours CBG: No results found for this basename: GLUCAP,  in the last 168 hours  Recent Results (from the past 240 hour(s))  CULTURE, BLOOD (ROUTINE X 2)     Status: None   Collection Time    02/08/14 10:00 AM      Result Value Ref Range Status   Specimen Description BLOOD RIGHT ANTECUBITAL   Final   Special Requests BOTTLES DRAWN AEROBIC AND ANAEROBIC 6CC   Final   Culture  Setup Time     Final   Value: 02/08/2014 16:15     Performed at [facility]   Culture     Final   Value:        BLOOD CULTURE RECEIVED NO GROWTH TO DATE CULTURE WILL BE HELD FOR 5 DAYS BEFORE ISSUING A FINAL NEGATIVE REPORT     Performed at [facility]   Report Status PENDING   Incomplete  CULTURE, BLOOD (ROUTINE X 2)     Status: None   Collection Time    02/08/14 10:05 AM      Result Value Ref Range Status   Specimen Description BLOOD LEFT FOREARM   Final   Special Requests BOTTLES DRAWN AEROBIC AND ANAEROBIC 6CC   Final   Culture  Setup Time     Final   Value: 02/08/2014 16:15     Performed at [facility]   Culture     Final   Value:        BLOOD CULTURE RECEIVED NO GROWTH TO DATE CULTURE WILL BE HELD FOR 5 DAYS BEFORE ISSUING A FINAL NEGATIVE REPORT     Performed at [facility]   Report Status PENDING   Incomplete  BODY FLUID CULTURE     Status: None   Collection Time    02/08/14 12:25 PM      Result Value Ref Range Status   Specimen Description SYNOVIAL FLUID LEFT KNEE   Final   Special Requests NONE   Final   Gram Stain     Final   Value: ABUNDANT WBC PRESENT,BOTH PMN AND MONONUCLEAR     ABUNDANT GRAM POSITIVE COCCI     IN PAIRS IN CLUSTERS Performed at [facility]     Performed at [facility]   Culture     Final   Value: [place]  BUMPER 02/10/14 0825 BY SMITHERSJ Note: RIFAMPIN AND  GENTAMICIN SHOULD NOT BE USED AS SINGLE DRUGS FOR TREATMENT OF STAPH INFECTIONS. This organism DOES NOT demonstrate inducible Clindamycin resistance in vitro. CRITICAL RESULT CALLED TO, READ BACK BY AND      VERIFIED WITH: KAREN LOWE     Performed at [facility]   Report Status 02/10/2014 FINAL   Final   Organism ID, Bacteria METHICILLIN RESISTANT STAPHYLOCOCCUS AUREUS   Final  GRAM STAIN     Status: None   Collection Time    02/08/14 12:25 PM      Result Value Ref Range Status   Specimen Description SYNOVIAL FLUID LEFT KNEE   Final   Special Requests NONE   Final   Gram Stain     Final   Value: ABUNDANT WBC PRESENT,BOTH PMN AND MONONUCLEAR     ABUNDANT GRAM POSITIVE COCCI IN PAIRS IN CLUSTERS   Report Status 02/08/2014 FINAL   Final     Studies:  Recent x-ray studies have been reviewed in detail by the Attending Physician  Scheduled Meds:  Scheduled Meds: . amLODipine  10 mg Oral Daily  . folic acid  1 mg Oral Daily  . heparin  5,000 Units Subcutaneous 3 times per day  . LORazepam  0-4 mg Intravenous Q12H  . multivitamin with minerals  1 tablet Oral Daily  . ramipril  5 mg Oral Daily  . thiamine  100 mg Oral Daily  . vancomycin  1,250 mg Intravenous Q12H   Continuous Infusions: . sodium chloride 75 mL/hr at 02/11/14 0231    Time spent on care of this patient: 35 min   Merrill, MD 02/11/2014, 12:56 PM  LOS: 3 days   [facility] Office  [phone] Pager - Text Page per [email or web address]  If 7PM-7AM, please contact night-coverage [email or web address]

## 2014-02-12 DIAGNOSIS — C44711 Basal cell carcinoma of skin of unspecified lower limb, including hip: Secondary | ICD-10-CM

## 2014-02-12 LAB — BASIC METABOLIC PANEL
Anion gap: 13 (ref 5–15)
BUN: 8 mg/dL (ref 6–23)
CO2: 26 mEq/L (ref 19–32)
Calcium: 7.9 mg/dL — ABNORMAL LOW (ref 8.4–10.5)
Chloride: 91 mEq/L — ABNORMAL LOW (ref 96–112)
Creatinine, Ser: 0.87 mg/dL (ref 0.50–1.35)
GFR calc Af Amer: >90 mL/min (ref >90)
GFR calc non Af Amer: 90 mL/min — ABNORMAL LOW (ref >90)
Glucose, Bld: 92 mg/dL (ref 70–99)
Potassium: 4 mEq/L (ref 3.7–5.3)
Sodium: 130 mEq/L — ABNORMAL LOW (ref 137–147)

## 2014-02-12 LAB — CBC
HCT: 32 % — ABNORMAL LOW (ref 39.0–52.0)
Hemoglobin: 11 g/dL — ABNORMAL LOW (ref 13.0–17.0)
MCH: 31.2 pg (ref 26.0–34.0)
MCHC: 34.4 g/dL (ref 30.0–36.0)
MCV: 90.7 fL (ref 78.0–100.0)
Platelets: 364 10*3/uL (ref 150–400)
RBC: 3.53 MIL/uL — ABNORMAL LOW (ref 4.22–5.81)
RDW: 13.2 % (ref 11.5–15.5)
WBC: 9.9 10*3/uL (ref 4.0–10.5)

## 2014-02-12 MED ORDER — IMIQUIMOD 5 % EX CREA
1.0000 "application " | TOPICAL_CREAM | Freq: Every day | CUTANEOUS | Status: DC
  Administered 2014-02-15: 1 via TOPICAL

## 2014-02-12 MED ORDER — HYDROCERIN EX CREA
TOPICAL_CREAM | Freq: Two times a day (BID) | CUTANEOUS | Status: DC
  Administered 2014-02-15 – 2014-02-16 (×2): via TOPICAL
  Filled 2014-02-12: qty 113

## 2014-02-12 MED ORDER — TAZAROTENE 0.05 % EX GEL
1.0000 "application " | Freq: Every morning | CUTANEOUS | Status: DC
  Administered 2014-02-13 – 2014-02-16 (×3): 1 via TOPICAL

## 2014-02-12 NOTE — Progress Notes (Signed)
Jesse Pope  MRN: [id number] DOB/Age: 1950-12-15 63 y.o. Physician: Rada Hay Procedure: Procedure(s) (LRB): IRRIGATION AND DEBRIDEMENT EXTREMITY/PRE PATELLA BURSA (Left)     Subjective: No specific c/o. His dressing had to be reinforced last night due to incresaing drainage  Vital Signs Temp:  [98.1 F (36.7 C)-98.6 F (37 C)] 98.6 F (37 C) (07/31 0442) Pulse Rate:  [79-88] 79 (07/31 0442) Resp:  [18] 18 (07/31 0442) BP: (120-149)/(73-99) 120/73 mmHg (07/31 0442) SpO2:  [96 %-99 %] 96 % (07/31 0442)  Lab Results  Recent Labs  02/11/14 0654 02/12/14 0523  WBC 12.1* 9.9  HGB 12.0* 11.0*  HCT 34.4* 32.0*  PLT 329 364   BMET  Recent Labs  02/11/14 0654 02/12/14 0523  NA 130* 130*  K 4.1 4.0  CL 91* 91*  CO2 26 26  GLUCOSE 104* 92  BUN 10 8  CREATININE 0.84 0.87  CALCIUM 8.3* 7.9*   No results found for this basename: inr     Exam Dressings removed Soft tissues of thigh and surrounding skin continue to improve The open incision has had re accumulation of a dark murky serous drainage mostly pocketed in the lateral flap I am able to milk out some additional fluid there and the wound was repacked with saline sponges        Plan Continue BID wet to dry dressing changes as the wound continues to declare itself  Jesse Pope for Dr.Kevin Supple 02/12/2014, 8:42 AM

## 2014-02-12 NOTE — Progress Notes (Signed)
Patient ID: Jesse Pope, male   DOB: [birth year], 63 y.o.   MRN: [id number]         [facility]    Date of Admission:  02/08/2014           Day 5 vancomycin  Principal Problem:   Septic infrapatellar bursitis of left knee Active Problems:   Hyponatremia   HTN (hypertension)   Cigarette smoker   Eczema   Basal cell carcinoma of lower leg   . amLODipine  10 mg Oral Daily  . folic acid  1 mg Oral Daily  . heparin  5,000 Units Subcutaneous 3 times per day  . hydrocerin   Topical BID  . imiquimod  1 application Topical QHS  . multivitamin with minerals  1 tablet Oral Daily  . ramipril  5 mg Oral Daily  . tazarotene  1 application Topical q morning - 10a  . thiamine  100 mg Oral Daily  . vancomycin  1,250 mg Intravenous Q12H    Subjective: He is bored but not having much pain.  Past Medical History  Diagnosis Date  . Basal cell carcinoma of right lower leg   . Hypertension     History  Substance Use Topics  . Smoking status: Current Every Day Smoker  . Smokeless tobacco: Not on file  . Alcohol Use: Yes    History reviewed. No pertinent family history.  Allergies  Allergen Reactions  . Penicillins Rash    Objective: Temp:  [98.1 F (36.7 C)-98.6 F (37 C)] 98.2 F (36.8 C) (07/31 1100) Pulse Rate:  [76-88] 76 (07/31 1100) Resp:  [18] 18 (07/31 1100) BP: (119-149)/(69-99) 119/69 mmHg (07/31 1100) SpO2:  [96 %-99 %] 99 % (07/31 1100)  General: He is in good. Watching television Lungs: Clear Cor: Regular S1 and S2 no murmurs Left knee: Increased wound drainage noted overnight. His knee is wrapped in a bulky, clean gauze dressing  Lab Results Lab Results  Component Value Date   WBC 9.9 02/12/2014   HGB 11.0* 02/12/2014   HCT 32.0* 02/12/2014   MCV 90.7 02/12/2014   PLT 364 02/12/2014    Lab Results  Component Value Date   CREATININE 0.87 02/12/2014   BUN 8 02/12/2014   NA 130* 02/12/2014   K 4.0 02/12/2014   CL 91*  02/12/2014   CO2 26 02/12/2014    Lab Results  Component Value Date   ALT 19 02/09/2014   AST 28 02/09/2014   ALKPHOS 70 02/09/2014   BILITOT 1.0 02/09/2014      Microbiology: Recent Results (from the past 240 hour(s))  CULTURE, BLOOD (ROUTINE X 2)     Status: None   Collection Time    02/08/14 10:00 AM      Result Value Ref Range Status   Specimen Description BLOOD RIGHT ANTECUBITAL   Final   Special Requests BOTTLES DRAWN AEROBIC AND ANAEROBIC [facility]   Final   Culture  Setup Time     Final   Value: 02/08/2014 16:15     Performed at [facility]   Culture     Final   Value:        BLOOD CULTURE RECEIVED NO GROWTH TO DATE CULTURE WILL BE HELD FOR 5 DAYS BEFORE ISSUING A FINAL NEGATIVE REPORT     Performed at [facility]   Report Status PENDING   Incomplete  CULTURE, BLOOD (ROUTINE X 2)     Status: None  Collection Time    02/08/14 10:05 AM      Result Value Ref Range Status   Specimen Description BLOOD LEFT FOREARM   Final   Special Requests BOTTLES DRAWN AEROBIC AND ANAEROBIC [facility]   Final   Culture  Setup Time     Final   Value: 02/08/2014 16:15     Performed at [facility]   Culture     Final   Value:        BLOOD CULTURE RECEIVED NO GROWTH TO DATE CULTURE WILL BE HELD FOR 5 DAYS BEFORE ISSUING A FINAL NEGATIVE REPORT     Performed at [facility]   Report Status PENDING   Incomplete  BODY FLUID CULTURE     Status: None   Collection Time    02/08/14 12:25 PM      Result Value Ref Range Status   Specimen Description SYNOVIAL FLUID LEFT KNEE   Final   Special Requests NONE   Final   Gram Stain     Final   Value: ABUNDANT WBC PRESENT,BOTH PMN AND MONONUCLEAR     ABUNDANT GRAM POSITIVE COCCI     IN PAIRS IN CLUSTERS Performed at [facility]     Performed at [facility]   Culture     Final   Value: ABUNDANT METHICILLIN RESISTANT STAPHYLOCOCCUS AUREUS     BUMPER 02/10/14 0825 BY SMITHERSJ Note: RIFAMPIN AND GENTAMICIN SHOULD NOT  BE USED AS SINGLE DRUGS FOR TREATMENT OF STAPH INFECTIONS. This organism DOES NOT demonstrate inducible Clindamycin resistance in vitro. CRITICAL RESULT CALLED TO, READ BACK BY AND      VERIFIED WITH: KAREN LOWE     Performed at [facility]   Report Status 02/10/2014 FINAL   Final   Organism ID, Bacteria METHICILLIN RESISTANT STAPHYLOCOCCUS AUREUS   Final  GRAM STAIN     Status: None   Collection Time    02/08/14 12:25 PM      Result Value Ref Range Status   Specimen Description SYNOVIAL FLUID LEFT KNEE   Final   Special Requests NONE   Final   Gram Stain     Final   Value: ABUNDANT WBC PRESENT,BOTH PMN AND MONONUCLEAR     ABUNDANT GRAM POSITIVE COCCI IN PAIRS IN CLUSTERS   Report Status 02/08/2014 FINAL   Final    Assessment: He has MRSA prepatellar bursitis with continued wound drainage.  Plan: 1. Continue vancomycin while hospitalized then consider conversion to oral doxycycline on discharge and a total of 3 weeks of therapy 2. Call me for any infectious disease questions this weekend  Michel Bickers, MD [facility] [phone] pager   [phone] cell 02/12/2014, 1:05 PM

## 2014-02-12 NOTE — Progress Notes (Signed)
Physical Therapy Wound Evaluation and Treatment Patient Details  Name: ASAF ELMQUIST MRN: [id number] Date of Birth: 1951-03-03  Today's Date: 02/12/2014 Time: 5597-4163 Time Calculation (min): 45 min  Subjective  Subjective: pt reports he will be hospitalized for several days per PA Patient and Family Stated Goals: get knee healed Date of Onset:  (1 week PTA developed knee pain) Prior Treatments: I& D surgery  Pain Score:   7/10 pre and post-treatment (pre-medicated for pain)  Wound Assessment  Clinical Statement: Pt is s/p I&D of Lt pre-patellar bursa with unfortunately a 6.9 cm deep "pocket" opened on lateral aspect. Purulent drainage was expressed repeatedly from this area. Pt may benefit from use of tunnel tip during PLS to further clean and debride this area. Will follow.  Wound / Incision (Open or Dehisced) 02/08/14 Lt anterior knee (Active)  Dressing Type ABD;Gauze (Comment) 02/12/2014  3:50 PM  Dressing Changed Changed 02/12/2014  3:50 PM  Dressing Status Clean;Dry;Intact 02/12/2014  3:50 PM  Dressing Change Frequency Twice a day 02/12/2014  3:50 PM  Site / Wound Assessment Granulation tissue;Yellow;Painful 02/12/2014  3:50 PM  % Wound base Red or Granulating 70% 02/12/2014  3:50 PM  % Wound base Yellow 30% 02/12/2014  3:50 PM  % Wound base Black 0% 02/12/2014  3:50 PM  % Wound base Other (Comment) 0% 02/12/2014  3:50 PM  Peri-wound Assessment Edema;Erythema (non-blanchable);Induration 02/12/2014  3:50 PM  Wound Length (cm) 12.2 cm 02/12/2014  3:50 PM  Wound Width (cm) 3.6 cm 02/12/2014  3:50 PM  Wound Depth (cm) 1.6 cm 02/12/2014  3:50 PM  Undermining (cm) @ 3:00= 6.9 cm 02/12/2014  3:50 PM  Margins Unattached edges (unapproximated) 02/12/2014  3:50 PM  Closure None 02/12/2014  3:50 PM  Drainage Amount Copious 02/12/2014  3:50 PM  Drainage Description Purulent;Sanguineous;No odor 02/12/2014  3:50 PM  Non-staged Wound Description Full thickness 02/12/2014  3:50 PM  Treatment  Debridement (Selective);Hydrotherapy (Pulse lavage);Packing (Plain strip);Other (Comment) 02/12/2014  3:50 PM   Hydrotherapy Pulsed lavage therapy - wound location: Lt knee Pulsed Lavage with Suction (psi): 4 psi Pulsed Lavage with Suction - Normal Saline Used: 1000 mL Pulsed Lavage Tip: Tip with splash shield Selective Debridement Selective Debridement - Location: same Selective Debridement - Tools Used: Forceps;Scissors Selective Debridement - Tissue Removed: yellow necrotic tissue   Wound Assessment and Plan  Wound Therapy - Assess/Plan/Recommendations Wound Therapy - Clinical Statement: Pt is s/p I&D of Lt pre-patellar bursa with unfortunately a deep "pocket" opened on lateral aspect. Purulent drainage was expressed repeatedly from this area. Pt may benefit from use of tunnel tip during PLS to further clean and debride this area. Will follow. Wound Therapy - Functional Problem List: limited Lt knee ROM (5-80 deg flexion) with impaired transfers and ambulation Factors Delaying/Impairing Wound Healing: Infection - systemic/local;Immobility Hydrotherapy Plan: Debridement;Dressing change;Patient/family education;Pulsatile lavage with suction Wound Therapy - Frequency: 6X / week Wound Therapy - Follow Up Recommendations: [place] (vs MD office (due to deep pocket needs packing)) Wound Plan: see above  Wound Therapy Goals- Improve the function of patient's integumentary system by progressing the wound(s) through the phases of wound healing (inflammation - proliferation - remodeling) by: Decrease Necrotic Tissue to: 10 Decrease Necrotic Tissue - Progress: Goal set today Increase Granulation Tissue to: 90 Increase Granulation Tissue - Progress: Goal set today Improve Drainage Characteristics: Min;Serous Improve Drainage Characteristics - Progress: Goal set today Goals/treatment plan/discharge plan were made with and agreed upon by patient/family: Yes Time For Goal  Achievement: 7  days Wound Therapy - Potential for Goals: Good  Goals will be updated until maximal potential achieved or discharge criteria met.  Discharge criteria: when goals achieved, discharge from hospital, MD decision/surgical intervention, no progress towards goals, refusal/missing three consecutive treatments without notification or medical reason.  GP     Lelania Bia 02/12/2014, 4:05 PM Pager [phone]

## 2014-02-12 NOTE — Progress Notes (Signed)
[facility] Progress Note   Jesse Pope PXT:[id number] DOB: [birth year] DOA: 02/08/2014 PCP:  Melinda Crutch, MD  Brief narrative: Jesse Pope is a 63 y.o. male  presents with prepatellar bursitis and cellulitis left leg   Subjective: Doing well with ambulating. No complaints.   Assessment/Plan: Septic prepatellar bursitis of left knee -MRSA--Left lower extremity cellulitis --sepsis -Status post aspiration per orthopedic/Dr. supple on 7/27  -s/p  I&D and bursectomy on 7/29 -- antibiotics by ID -Continue pain management - added Vicodin as pt had no oral pain meds ordered - needs outpt PT per Pt notes  Active Problems:   Hyponatremia  - sodium or 121 on admission- may be due to beer drinking -No significant change with fluid restriction on admission.  -Urine sodium is less than 20, more consistent with volume depletion  -improved to 130 with hydration -  D/'c  IVF 7/30  Alcohol abuse  -d/c CIWA protocol now as no evidence of withdrawal   Tobacco abuse  -Counseled to stop smoking  HTN Cont Ramapril    Code Status: Full code Family Communication: with wife Disposition Plan: home with home health once OK with Ortho  Consultants: ortho  Procedures: I and D and bursectomy  7/29  Antibiotics: Anti-infectives   Start     Dose/Rate Route Frequency Ordered Stop   02/11/14 1400  vancomycin (VANCOCIN) 1,250 mg in sodium chloride 0.9 % 250 mL IVPB     1,250 mg 166.7 mL/hr over 90 Minutes Intravenous Every 12 hours 02/11/14 0808     02/09/14 0200  vancomycin (VANCOCIN) IVPB 750 mg/150 ml premix  Status:  Discontinued     750 mg 150 mL/hr over 60 Minutes Intravenous Every 12 hours 02/08/14 1439 02/11/14 0808   02/08/14 1245  vancomycin (VANCOCIN) 1,500 mg in sodium chloride 0.9 % 500 mL IVPB     1,500 mg 250 mL/hr over 120 Minutes Intravenous  Once 02/08/14 1243 02/08/14 1525       DVT prophylaxis: Heparin  Objective: Filed Weights   02/08/14 1414   Weight: 69.809 kg (153 lb 14.4 oz)    Intake/Output Summary (Last 24 hours) at 02/12/14 1202 Last data filed at 02/12/14 0916  Gross per 24 hour  Intake    720 ml  Output   1500 ml  Net   -780 ml     Vitals Filed Vitals:   02/11/14 0552 02/11/14 1515 02/11/14 2042 02/12/14 0442  BP: 150/88 149/79 126/99 120/73  Pulse: 85 88 84 79  Temp: 98 F (36.7 C) 98.1 F (36.7 C) 98.3 F (36.8 C) 98.6 F (37 C)  TempSrc: Oral Oral Oral Oral  Resp: 18 18 18 18   Height:      Weight:      SpO2: 97% 98% 99% 96%    Exam: General: No acute respiratory distress Lungs: Clear to auscultation bilaterally without wheezes or crackles Cardiovascular: Regular rate and rhythm without murmur gallop or rub normal S1 and S2 Abdomen: Nontender, nondistended, soft, bowel sounds positive, no rebound, no ascites, no appreciable mass Extremities: No significant cyanosis, clubbing- right  knee in dressing  Data Reviewed: Basic Metabolic Panel:  Recent Labs Lab 02/08/14 1000 02/08/14 1345 02/09/14 0442 02/10/14 0550 02/11/14 0654 02/12/14 0523  NA 121*  --  122* 131* 130* 130*  K 5.2  --  4.2 4.8 4.1 4.0  CL 84*  --  86* 92* 91* 91*  CO2 19  --  23 25 26 26   GLUCOSE  101*  --  93 108* 104* 92  BUN 12  --  17 13 10 8   CREATININE 1.17 1.10 0.95 0.91 0.84 0.87  CALCIUM 8.9  --  8.4 8.5 8.3* 7.9*   Liver Function Tests:  Recent Labs Lab 02/08/14 1345 02/09/14 0442  AST 18 28  ALT 14 19  ALKPHOS 82 70  BILITOT 1.3* 1.0  PROT 6.6 6.6  ALBUMIN 2.7* 2.6*   No results found for this basename: LIPASE, AMYLASE,  in the last 168 hours No results found for this basename: AMMONIA,  in the last 168 hours CBC:  Recent Labs Lab 02/08/14 1000 02/08/14 1345 02/09/14 0442 02/10/14 0550 02/11/14 0654 02/12/14 0523  WBC 16.9* 15.1* 13.4* 15.0* 12.1* 9.9  NEUTROABS 13.9*  --   --   --   --   --   HGB 14.1 12.6* 12.5* 12.5* 12.0* 11.0*  HCT 39.2 36.4* 34.9* 36.2* 34.4* 32.0*  MCV 91.0 91.0  89.5 90.7 90.3 90.7  PLT 252 235 276 311 329 364   Cardiac Enzymes:  Recent Labs Lab 02/08/14 1000  CKTOTAL 32   BNP (last 3 results) No results found for this basename: PROBNP,  in the last 8760 hours CBG: No results found for this basename: GLUCAP,  in the last 168 hours  Recent Results (from the past 240 hour(s))  CULTURE, BLOOD (ROUTINE X 2)     Status: None   Collection Time    02/08/14 10:00 AM      Result Value Ref Range Status   Specimen Description BLOOD RIGHT ANTECUBITAL   Final   Special Requests BOTTLES DRAWN AEROBIC AND ANAEROBIC 6CC   Final   Culture  Setup Time     Final   Value: 02/08/2014 16:15     Performed at [facility]   Culture     Final   Value:        BLOOD CULTURE RECEIVED NO GROWTH TO DATE CULTURE WILL BE HELD FOR 5 DAYS BEFORE ISSUING A FINAL NEGATIVE REPORT     Performed at [facility]   Report Status PENDING   Incomplete  CULTURE, BLOOD (ROUTINE X 2)     Status: None   Collection Time    02/08/14 10:05 AM      Result Value Ref Range Status   Specimen Description BLOOD LEFT FOREARM   Final   Special Requests BOTTLES DRAWN AEROBIC AND ANAEROBIC 6CC   Final   Culture  Setup Time     Final   Value: 02/08/2014 16:15     Performed at [facility]   Culture     Final   Value:        BLOOD CULTURE RECEIVED NO GROWTH TO DATE CULTURE WILL BE HELD FOR 5 DAYS BEFORE ISSUING A FINAL NEGATIVE REPORT     Performed at [facility]   Report Status PENDING   Incomplete  BODY FLUID CULTURE     Status: None   Collection Time    02/08/14 12:25 PM      Result Value Ref Range Status   Specimen Description SYNOVIAL FLUID LEFT KNEE   Final   Special Requests NONE   Final   Gram Stain     Final   Value: ABUNDANT WBC PRESENT,BOTH PMN AND MONONUCLEAR     ABUNDANT GRAM POSITIVE COCCI     IN PAIRS IN CLUSTERS Performed at [facility]     Performed at [facility]  Culture     Final   Value: ABUNDANT METHICILLIN  RESISTANT STAPHYLOCOCCUS AUREUS     BUMPER 02/10/14 0825 BY SMITHERSJ Note: RIFAMPIN AND GENTAMICIN SHOULD NOT BE USED AS SINGLE DRUGS FOR TREATMENT OF STAPH INFECTIONS. This organism DOES NOT demonstrate inducible Clindamycin resistance in vitro. CRITICAL RESULT CALLED TO, READ BACK BY AND      VERIFIED WITH: KAREN LOWE     Performed at [facility]   Report Status 02/10/2014 FINAL   Final   Organism ID, Bacteria METHICILLIN RESISTANT STAPHYLOCOCCUS AUREUS   Final  GRAM STAIN     Status: None   Collection Time    02/08/14 12:25 PM      Result Value Ref Range Status   Specimen Description SYNOVIAL FLUID LEFT KNEE   Final   Special Requests NONE   Final   Gram Stain     Final   Value: ABUNDANT WBC PRESENT,BOTH PMN AND MONONUCLEAR     ABUNDANT GRAM POSITIVE COCCI IN PAIRS IN CLUSTERS   Report Status 02/08/2014 FINAL   Final     Studies:  Recent x-ray studies have been reviewed in detail by the Attending Physician  Scheduled Meds:  Scheduled Meds: . amLODipine  10 mg Oral Daily  . folic acid  1 mg Oral Daily  . heparin  5,000 Units Subcutaneous 3 times per day  . multivitamin with minerals  1 tablet Oral Daily  . ramipril  5 mg Oral Daily  . thiamine  100 mg Oral Daily  . vancomycin  1,250 mg Intravenous Q12H   Continuous Infusions:    Time spent on care of this patient: 35 min   [place], MD 02/12/2014, 12:02 PM  LOS: 4 days   [facility] Office  [phone] Pager - Text Page per [email or web address]  If 7PM-7AM, please contact night-coverage [email or web address]

## 2014-02-12 NOTE — Consult Note (Addendum)
WOC input requested regarding left knee surgical site.  Ortho team following post-op.  BID dressings have been ordered and hydrotherapy by physical therapy.  Discussed plan of care with Jenetta Loges, ortho PA via phone; WOC is unable to provide any further recommendations for optimal topical treatment which have not already been ordered and the consult was cancelled as requested by the PA.   Please re-consult if further assistance is needed.  Thank-you,  Julien Girt MSN, [place], [place], [place], [place]

## 2014-02-13 LAB — VANCOMYCIN, TROUGH: Vancomycin Tr: 17.4 ug/mL (ref 10.0–20.0)

## 2014-02-13 NOTE — Progress Notes (Signed)
[facility] Progress Note   Jesse Pope ZJQ:[id number] DOB: 11-Jun-1951 DOA: 02/08/2014 PCP:  Melinda Crutch, MD  Brief narrative: Jesse Pope is a 63 y.o. male  presents with prepatellar bursitis and cellulitis left leg   Subjective: Apparently abscess is quite deep still - may need to go to OR again, he has no complaints.   Assessment/Plan: Septic prepatellar bursitis of left knee -MRSA--Left lower extremity cellulitis --sepsis -Status post aspiration per orthopedic/Dr. supple on 7/27  -s/p  I&D and bursectomy on 7/29 -- per ID, cont Vanc while hospitalized and then switch to Doxy for total of 3 wk course -Continue pain management - added Vicodin as pt had no oral pain meds ordered - needs outpt PT per Pt notes  Active Problems:   Hyponatremia  - sodium or 121 on admission- may be due to beer drinking -No significant change with fluid restriction on admission.  -Urine sodium is less than 20, more consistent with volume depletion  -improved to 130 with hydration -  D/'c  IVF 7/30- eating and drinking well  Alcohol abuse  -d/c CIWA protocol now as no evidence of withdrawal   Tobacco abuse  -Counseled to stop smoking  HTN Cont Ramapril  Right leg Basal cell cancer - cont Tazorac    Code Status: Full code Family Communication: with wife Disposition Plan: home with home health once OK with Ortho  Consultants: ortho  Procedures: I and D and bursectomy  7/29  Antibiotics: Anti-infectives   Start     Dose/Rate Route Frequency Ordered Stop   02/11/14 1400  vancomycin (VANCOCIN) 1,250 mg in sodium chloride 0.9 % 250 mL IVPB     1,250 mg 166.7 mL/hr over 90 Minutes Intravenous Every 12 hours 02/11/14 0808     02/09/14 0200  vancomycin (VANCOCIN) IVPB 750 mg/150 ml premix  Status:  Discontinued     750 mg 150 mL/hr over 60 Minutes Intravenous Every 12 hours 02/08/14 1439 02/11/14 0808   02/08/14 1245  vancomycin (VANCOCIN) 1,500 mg in sodium chloride  0.9 % 500 mL IVPB     1,500 mg 250 mL/hr over 120 Minutes Intravenous  Once 02/08/14 1243 02/08/14 1525       DVT prophylaxis: Heparin  Objective: Filed Weights   02/08/14 1414  Weight: 69.809 kg (153 lb 14.4 oz)    Intake/Output Summary (Last 24 hours) at 02/13/14 1127 Last data filed at 02/13/14 1025  Gross per 24 hour  Intake    840 ml  Output   1150 ml  Net   -310 ml     Vitals Filed Vitals:   02/12/14 0442 02/12/14 1100 02/12/14 1948 02/13/14 0602  BP: 120/73 119/69 143/80 128/75  Pulse: 79 76 75 75  Temp: 98.6 F (37 C) 98.2 F (36.8 C) 98.5 F (36.9 C) 98.4 F (36.9 C)  TempSrc: Oral Oral Oral Oral  Resp: 18 18 18 18   Height:      Weight:      SpO2: 96% 99% 93% 96%    Exam: General: No acute respiratory distress Lungs: Clear to auscultation bilaterally without wheezes or crackles Cardiovascular: Regular rate and rhythm without murmur gallop or rub normal S1 and S2 Abdomen: Nontender, nondistended, soft, bowel sounds positive, no rebound, no ascites, no appreciable mass Extremities: No significant cyanosis, clubbing- right  knee in dressing  Data Reviewed: Basic Metabolic Panel:  Recent Labs Lab 02/08/14 1000 02/08/14 1345 02/09/14 0442 02/10/14 0550 02/11/14 0654 02/12/14 0523  NA 121*  --  122* 131* 130* 130*  K 5.2  --  4.2 4.8 4.1 4.0  CL 84*  --  86* 92* 91* 91*  CO2 19  --  23 25 26 26   GLUCOSE 101*  --  93 108* 104* 92  BUN 12  --  17 13 10 8   CREATININE 1.17 1.10 0.95 0.91 0.84 0.87  CALCIUM 8.9  --  8.4 8.5 8.3* 7.9*   Liver Function Tests:  Recent Labs Lab 02/08/14 1345 02/09/14 0442  AST 18 28  ALT 14 19  ALKPHOS 82 70  BILITOT 1.3* 1.0  PROT 6.6 6.6  ALBUMIN 2.7* 2.6*   No results found for this basename: LIPASE, AMYLASE,  in the last 168 hours No results found for this basename: AMMONIA,  in the last 168 hours CBC:  Recent Labs Lab 02/08/14 1000 02/08/14 1345 02/09/14 0442 02/10/14 0550 02/11/14 0654  02/12/14 0523  WBC 16.9* 15.1* 13.4* 15.0* 12.1* 9.9  NEUTROABS 13.9*  --   --   --   --   --   HGB 14.1 12.6* 12.5* 12.5* 12.0* 11.0*  HCT 39.2 36.4* 34.9* 36.2* 34.4* 32.0*  MCV 91.0 91.0 89.5 90.7 90.3 90.7  PLT 252 235 276 311 329 364   Cardiac Enzymes:  Recent Labs Lab 02/08/14 1000  CKTOTAL 32   BNP (last 3 results) No results found for this basename: PROBNP,  in the last 8760 hours CBG: No results found for this basename: GLUCAP,  in the last 168 hours  Recent Results (from the past 240 hour(s))  CULTURE, BLOOD (ROUTINE X 2)     Status: None   Collection Time    02/08/14 10:00 AM      Result Value Ref Range Status   Specimen Description BLOOD RIGHT ANTECUBITAL   Final   Special Requests BOTTLES DRAWN AEROBIC AND ANAEROBIC [facility]   Final   Culture  Setup Time     Final   Value: 02/08/2014 16:15     Performed at [facility]   Culture     Final   Value:        BLOOD CULTURE RECEIVED NO GROWTH TO DATE CULTURE WILL BE HELD FOR 5 DAYS BEFORE ISSUING A FINAL NEGATIVE REPORT     Performed at [facility]   Report Status PENDING   Incomplete  CULTURE, BLOOD (ROUTINE X 2)     Status: None   Collection Time    02/08/14 10:05 AM      Result Value Ref Range Status   Specimen Description BLOOD LEFT FOREARM   Final   Special Requests BOTTLES DRAWN AEROBIC AND ANAEROBIC 6CC   Final   Culture  Setup Time     Final   Value: 02/08/2014 16:15     Performed at [facility]   Culture     Final   Value:        BLOOD CULTURE RECEIVED NO GROWTH TO DATE CULTURE WILL BE HELD FOR 5 DAYS BEFORE ISSUING A FINAL NEGATIVE REPORT     Performed at [facility]   Report Status PENDING   Incomplete  BODY FLUID CULTURE     Status: None   Collection Time    02/08/14 12:25 PM      Result Value Ref Range Status   Specimen Description SYNOVIAL FLUID LEFT KNEE   Final   Special Requests NONE   Final   Gram Stain     Final   Value: ABUNDANT  WBC PRESENT,BOTH PMN AND  MONONUCLEAR     ABUNDANT GRAM POSITIVE COCCI     IN PAIRS IN CLUSTERS Performed at [facility]     Performed at [facility]   Culture     Final   Value: ABUNDANT METHICILLIN RESISTANT STAPHYLOCOCCUS AUREUS     BUMPER 02/10/14 0825 BY SMITHERSJ Note: RIFAMPIN AND GENTAMICIN SHOULD NOT BE USED AS SINGLE DRUGS FOR TREATMENT OF STAPH INFECTIONS. This organism DOES NOT demonstrate inducible Clindamycin resistance in vitro. CRITICAL RESULT CALLED TO, READ BACK BY AND      VERIFIED WITH: KAREN LOWE     Performed at [facility]   Report Status 02/10/2014 FINAL   Final   Organism ID, Bacteria METHICILLIN RESISTANT STAPHYLOCOCCUS AUREUS   Final  GRAM STAIN     Status: None   Collection Time    02/08/14 12:25 PM      Result Value Ref Range Status   Specimen Description SYNOVIAL FLUID LEFT KNEE   Final   Special Requests NONE   Final   Gram Stain     Final   Value: ABUNDANT WBC PRESENT,BOTH PMN AND MONONUCLEAR     ABUNDANT GRAM POSITIVE COCCI IN PAIRS IN CLUSTERS   Report Status 02/08/2014 FINAL   Final     Studies:  Recent x-ray studies have been reviewed in detail by the Attending Physician  Scheduled Meds:  Scheduled Meds: . amLODipine  10 mg Oral Daily  . folic acid  1 mg Oral Daily  . heparin  5,000 Units Subcutaneous 3 times per day  . hydrocerin   Topical BID  . imiquimod  1 application Topical QHS  . multivitamin with minerals  1 tablet Oral Daily  . ramipril  5 mg Oral Daily  . tazarotene  1 application Topical q morning - 10a  . thiamine  100 mg Oral Daily  . vancomycin  1,250 mg Intravenous Q12H   Continuous Infusions:    Time spent on care of this patient: 35 min   [place], MD 02/13/2014, 11:27 AM  LOS: 5 days   [facility] Office  [phone] Pager - Text Page per [email or web address]  If 7PM-7AM, please contact night-coverage [email or web address]

## 2014-02-13 NOTE — Progress Notes (Signed)
Subjective: 3 Days Post-Op Procedure(s) (LRB): IRRIGATION AND DEBRIDEMENT EXTREMITY/PRE PATELLA BURSA (Left) Patient reports pain as 3 on 0-10 scale.   Patient states that left knee is feeling somewhat better immediately but had a slightly increased pain laterally. Overall feels improved. Objective: Vital signs in last 24 hours: Temp:  [98.2 F (36.8 C)-98.5 F (36.9 C)] 98.4 F (36.9 C) (08/01 0602) Pulse Rate:  [75-76] 75 (08/01 0602) Resp:  [18] 18 (08/01 0602) BP: (119-143)/(69-80) 128/75 mmHg (08/01 0602) SpO2:  [93 %-99 %] 96 % (08/01 0602)  Intake/Output from previous day: 07/31 0701 - 08/01 0700 In: 840 [P.O.:840] Out: 800 [Urine:800] Intake/Output this shift: Total I/O In: 240 [P.O.:240] Out: 350 [Urine:350]   Recent Labs  02/11/14 0654 02/12/14 0523  HGB 12.0* 11.0*    Recent Labs  02/11/14 0654 02/12/14 0523  WBC 12.1* 9.9  RBC 3.81* 3.53*  HCT 34.4* 32.0*  PLT 329 364    Recent Labs  02/11/14 0654 02/12/14 0523  NA 130* 130*  K 4.1 4.0  CL 91* 91*  CO2 26 26  BUN 10 8  CREATININE 0.84 0.87  GLUCOSE 104* 92  CALCIUM 8.3* 7.9*   No results found for this basename: LABPT, INR,  in the last 72 hours  Intact pulses distally wound just irrigated and packed by wound therapy [place]. There note was reviewed. I erythema the distal lateral thigh. Mild tenderness.  Assessment/Plan: 3 Days Post-Op Procedure(s) (LRB): IRRIGATION AND DEBRIDEMENT EXTREMITY/PRE PATELLA BURSA (Left) Up with therapy will continue with current wound management including twice a day dressing changes and daily hydrotherapy. Documented bursa methicillin resistant staph aureus prepatellar bursitis status post open I&D irrigation debridement. On current appropriate antibiotic regimen. Discuss patient with Dr. Gladstone Lighter with patient possible need for revision or repeat irrigation debridement tomorrow. Will recheck BMET  and CBC with differential in the morning and inspect wound tomorrow.  Make n.p.o. after midnight. Discuss with Dr. Gladstone Lighter . If in fact he thinks he needs to be irrigated and debrided tomorrow we'll proceed if not resume previous orders. All discussed with patient. He is in agreement.  Jesse Pope ANDREW 02/13/2014, 10:30 AM

## 2014-02-13 NOTE — Progress Notes (Signed)
Physical Therapy Wound Treatment Patient Details  Name: BOOKERT GUZZI MRN: [id number] Date of Birth: [birth year]  Today's Date: 02/13/2014 Time: 0820-0855 Time Calculation (min): 35 min  Subjective  Subjective: pt reports he will be hospitalized for several days per PA Patient and Family Stated Goals: get knee healed Date of Onset:  (1 week PTA developed knee pain) Prior Treatments: I& D surgery  Pain Score: Pain Score: 5  pre- and post-treatment  Wound Assessment  Clinical Statement: Pt's lateral knee to thigh with incr warmth compared to 7/31. Purulent drainage suctioned from lateral "pocket" which measured 7.0 cm deep (area "fans out" beyond the opening at the surface and is approximately 7 x 7 cm). Pt also with increased significant point tenderness proximal to wound (along lateral thigh). Will plan to see pt on Sunday due to severity of wound and pocketing area (unless pt returns for further surgery).   Wound / Incision (Open or Dehisced) 02/08/14 Lt anterior knee (Active)  Dressing Type ABD;Gauze (Comment) 02/13/2014  9:04 AM  Dressing Changed Changed 02/13/2014  9:04 AM  Dressing Status Clean;Dry;Intact 02/13/2014  9:04 AM  Dressing Change Frequency Twice a day 02/13/2014  9:04 AM  Site / Wound Assessment Granulation tissue;Yellow;Painful 02/13/2014  9:04 AM  % Wound base Red or Granulating 60% 02/13/2014  9:04 AM  % Wound base Yellow 40% 02/13/2014  9:04 AM  % Wound base Black 0% 02/13/2014  9:04 AM  % Wound base Other (Comment) 0% 02/13/2014  9:04 AM  Peri-wound Assessment Edema;Erythema (non-blanchable);Induration 02/13/2014  9:04 AM  Wound Length (cm) 12.2 cm 02/12/2014  3:50 PM  Wound Width (cm) 3.6 cm 02/12/2014  3:50 PM  Wound Depth (cm) 1.6 cm 02/12/2014  3:50 PM  Undermining (cm) @ 3:00= 6.9 cm 02/12/2014  3:50 PM  Margins Unattached edges (unapproximated) 02/13/2014  9:04 AM  Closure None 02/13/2014  9:04 AM  Drainage Amount Copious 02/13/2014  9:04 AM  Drainage Description  Purulent;Sanguineous;No odor 02/13/2014  9:04 AM  Non-staged Wound Description Full thickness 02/13/2014  9:04 AM  Treatment Debridement (Selective);Hydrotherapy (Pulse lavage);Packing (Plain strip) 02/13/2014  9:04 AM   Hydrotherapy Pulsed lavage therapy - wound location: Lt knee Pulsed Lavage with Suction (psi): 4 psi Pulsed Lavage with Suction - Normal Saline Used: 1000 mL Pulsed Lavage Tip: Tunneling tip Selective Debridement Selective Debridement - Location: same Selective Debridement - Tools Used: Forceps;Scissors;Scalpel Selective Debridement - Tissue Removed: yellow necrotic tissue   Wound Assessment and Plan  Wound Therapy - Assess/Plan/Recommendations Wound Therapy - Clinical Statement: Pt's lateral knee to thigh with incr warmth compared to 7/31. Purulent drainage suctioned from lateral "pocket" which measured 7.0 cm deep (area "fans out" beyond the opening at the surface and is approximately 7 x 7 cm). Pt also with increased tenderness proximal to wound. Wound Therapy - Functional Problem List: limited Lt knee ROM (5-80 deg flexion) with impaired transfers and ambulation Factors Delaying/Impairing Wound Healing: Infection - systemic/local;Immobility Hydrotherapy Plan: Debridement;Dressing change;Patient/family education;Pulsatile lavage with suction Wound Therapy - Frequency: Other (comment) (7x/week) Wound Therapy - Follow Up Recommendations: [place] (vs MD office (due to deep pocket needs packing)) Wound Plan: see above  Wound Therapy Goals- Improve the function of patient's integumentary system by progressing the wound(s) through the phases of wound healing (inflammation - proliferation - remodeling) by: Decrease Necrotic Tissue to: 10 Decrease Necrotic Tissue - Progress: Not progressing Increase Granulation Tissue to: 90 Increase Granulation Tissue - Progress: Mot progressing Improve Drainage Characteristics: Min;Serous Improve Drainage Characteristics -  Progress:  Progressing toward goal  Goals will be updated until maximal potential achieved or discharge criteria met.  Discharge criteria: when goals achieved, discharge from hospital, MD decision/surgical intervention, no progress towards goals, refusal/missing three consecutive treatments without notification or medical reason.  GP     Lekeisha Arenas 02/13/2014, 9:13 AM Pager [phone]

## 2014-02-13 NOTE — Progress Notes (Signed)
ANTIBIOTIC CONSULT NOTE - Follow Up   Pharmacy Consult for vancomycin Indication: septic bursitis  Allergies  Allergen Reactions  . Penicillins Rash    Patient Measurements: Height: 5\' 11"  (180.3 cm) Weight: 153 lb 14.4 oz (69.809 kg) IBW/kg (Calculated) : 75.3  Body Weight: 70.9 kg  Vital Signs: Temp: 98.4 F (36.9 C) (08/01 0602) Temp src: Oral (08/01 0602) BP: 128/75 mmHg (08/01 0602) Pulse Rate: 75 (08/01 0602) Intake/Output from previous day: 07/31 0701 - 08/01 0700 In: 840 [P.O.:840] Out: 800 [Urine:800] Intake/Output from this shift: Total I/O In: 240 [P.O.:240] Out: 350 [Urine:350]  Labs:  Recent Labs  02/11/14 0654 02/12/14 0523  WBC 12.1* 9.9  HGB 12.0* 11.0*  PLT 329 364  CREATININE 0.84 0.87   Estimated Creatinine Clearance: 85.8 ml/min (by C-G formula based on Cr of 0.87).  Recent Labs  02/10/14 1510 02/13/14 1313  VANCOTROUGH 7.6* 17.4     Medications:  See med history  Assessment: 63 yo man on vancomycin for septic prepatellar bursitis of left knee and LLE cellulitis. Plan is to cont vanc while here and dc on PO doxy. Trough came back therapeutic today.   Goal of Therapy:  Vanc trough = 15-20  Plan:   Cont vanc 1.25g IV q12

## 2014-02-14 LAB — CBC WITH DIFFERENTIAL/PLATELET
Basophils Absolute: 0.1 10*3/uL (ref 0.0–0.1)
Basophils Relative: 1 % (ref 0–1)
Eosinophils Absolute: 0.2 10*3/uL (ref 0.0–0.7)
Eosinophils Relative: 3 % (ref 0–5)
HCT: 32.2 % — ABNORMAL LOW (ref 39.0–52.0)
Hemoglobin: 11 g/dL — ABNORMAL LOW (ref 13.0–17.0)
Lymphocytes Relative: 12 % (ref 12–46)
Lymphs Abs: 0.9 10*3/uL (ref 0.7–4.0)
MCH: 30.6 pg (ref 26.0–34.0)
MCHC: 34.2 g/dL (ref 30.0–36.0)
MCV: 89.4 fL (ref 78.0–100.0)
Monocytes Absolute: 1.7 10*3/uL — ABNORMAL HIGH (ref 0.1–1.0)
Monocytes Relative: 22 % — ABNORMAL HIGH (ref 3–12)
Neutro Abs: 4.9 10*3/uL (ref 1.7–7.7)
Neutrophils Relative %: 62 % (ref 43–77)
Platelets: 423 10*3/uL — ABNORMAL HIGH (ref 150–400)
RBC: 3.6 MIL/uL — ABNORMAL LOW (ref 4.22–5.81)
RDW: 12.9 % (ref 11.5–15.5)
WBC: 7.8 10*3/uL (ref 4.0–10.5)

## 2014-02-14 LAB — CULTURE, BLOOD (ROUTINE X 2)
Culture: NO GROWTH
Culture: NO GROWTH

## 2014-02-14 LAB — BASIC METABOLIC PANEL
Anion gap: 15 (ref 5–15)
BUN: 7 mg/dL (ref 6–23)
CO2: 23 mEq/L (ref 19–32)
Calcium: 7.8 mg/dL — ABNORMAL LOW (ref 8.4–10.5)
Chloride: 93 mEq/L — ABNORMAL LOW (ref 96–112)
Creatinine, Ser: 0.77 mg/dL (ref 0.50–1.35)
GFR calc Af Amer: >90 mL/min (ref >90)
GFR calc non Af Amer: >90 mL/min (ref >90)
Glucose, Bld: 96 mg/dL (ref 70–99)
Potassium: 3.9 mEq/L (ref 3.7–5.3)
Sodium: 131 mEq/L — ABNORMAL LOW (ref 137–147)

## 2014-02-14 MED ORDER — ORITAVANCIN DIPHOSPHATE 400 MG IV SOLR
1200.0000 mg | Freq: Once | INTRAVENOUS | Status: AC
  Administered 2014-02-14: 1200 mg via INTRAVENOUS
  Filled 2014-02-14: qty 120

## 2014-02-14 MED ORDER — DOXYCYCLINE HYCLATE 100 MG PO TABS: 100.0000 mg | ORAL_TABLET | Freq: Two times a day (BID) | ORAL | Status: DC

## 2014-02-14 MED ORDER — NONFORMULARY OR COMPOUNDED ITEM: Status: DC

## 2014-02-14 MED ORDER — HYDROCODONE-ACETAMINOPHEN 5-325 MG PO TABS: 1.0000 | ORAL_TABLET | ORAL | Status: DC | PRN

## 2014-02-14 NOTE — Progress Notes (Signed)
Subjective: 4 Days Post-Op Procedure(s) (LRB): IRRIGATION AND DEBRIDEMENT EXTREMITY/PRE PATELLA BURSA (Left) Patient reports pain as 2 on 0-10 scale.Dressing changed and he has great granulation tissue.Drain removed and small amount of fluid was removed from the inferior pocket. Wound care will flush wound out today again. I would not pack the wound inferiorly in order for wound to drain if needed. He has been afebrile and most importantly his WBC has been decreasing daily. This morning his WBC 7.8. I discussed the case with his nurse. I will make him NPO after midnight tonight just in case Dr. Onnie Graham feels that he needs further treatment in OR. At this time I dont feel that this is necessary. Dr. Megan Salon has signed off and states he can be DC on P.O. Antibiotics when he is ready for DC.    Objective: Vital signs in last 24 hours: Temp:  [98.3 F (36.8 C)-98.7 F (37.1 C)] 98.7 F (37.1 C) (08/02 0511) Pulse Rate:  [72-78] 78 (08/02 0511) Resp:  [12-20] 12 (08/02 0511) BP: (126-140)/(56-73) 140/71 mmHg (08/02 0511) SpO2:  [98 %-99 %] 98 % (08/02 0511)  Intake/Output from previous day: 08/01 0701 - 08/02 0700 In: 2250 [P.O.:1000; IV Piggyback:1250] Out: 950 [Urine:950] Intake/Output this shift:     Recent Labs  02/12/14 0523 02/14/14 0315  HGB 11.0* 11.0*    Recent Labs  02/12/14 0523 02/14/14 0315  WBC 9.9 7.8  RBC 3.53* 3.60*  HCT 32.0* 32.2*  PLT 364 423*    Recent Labs  02/12/14 0523 02/14/14 0315  NA 130* 131*  K 4.0 3.9  CL 91* 93*  CO2 26 23  BUN 8 7  CREATININE 0.87 0.77  GLUCOSE 92 96  CALCIUM 7.9* 7.8*   No results found for this basename: LABPT, INR,  in the last 72 hours  Wound is improving.  Assessment/Plan: 4 Days Post-Op Procedure(s) (LRB): IRRIGATION AND DEBRIDEMENT EXTREMITY/PRE PATELLA BURSA (Left) Up with therapy The current medical regimen is effective;  continue present plan and medications. Wound care.  Jesse Pope A 02/14/2014,  7:48 AM

## 2014-02-14 NOTE — Progress Notes (Signed)
Physical Therapy Wound Treatment Patient Details  Name: Jesse Pope MRN: [id number] Date of Birth: 05/01/1951  Today's Date: 02/14/2014 Time: 0820-0856 Time Calculation (min): 36 min  Subjective  Subjective: Reports the doctor pushed out a lot of thick drainage from pocket Patient and Family Stated Goals: get knee healed Date of Onset:  (1 week PTA developed knee pain) Prior Treatments: I& D surgery  Pain Score: Pain Score: 8/10 Lt knee; pt pre-medicated with oral pain meds (had IV pain meds with MD ~1 hour prior) Wound Assessment  Clinical Statement: Edema and erythema both improved compared to 8/1. MD had expressed wound ~1 hour prior to hydrotherapy with much less purulent drainage removed during pulse lavage. Noted area proximally where wound margin is beginning to turn under and used forceps to minimize epibole.  Wound / Incision (Open or Dehisced) 02/08/14 Lt anterior knee (Active)  Dressing Type ABD;Gauze (Comment) 02/14/2014  8:57 AM  Dressing Changed Changed 02/14/2014  8:57 AM  Dressing Status Clean;Dry;Intact 02/14/2014  8:57 AM  Dressing Change Frequency Twice a day 02/14/2014  8:57 AM  Site / Wound Assessment Granulation tissue;Yellow;Painful 02/14/2014  8:57 AM  % Wound base Red or Granulating 60% 02/14/2014  8:57 AM  % Wound base Yellow 40% 02/14/2014  8:57 AM  % Wound base Black 0% 02/14/2014  8:57 AM  % Wound base Other (Comment) 0% 02/14/2014  8:57 AM  Peri-wound Assessment Edema;Erythema (non-blanchable);Induration 02/14/2014  8:57 AM  Wound Length (cm) 12.2 cm 02/12/2014  3:50 PM  Wound Width (cm) 3.6 cm 02/12/2014  3:50 PM  Wound Depth (cm) 1.6 cm 02/12/2014  3:50 PM  Undermining (cm) @ 3:00= 6.9 cm 02/12/2014  3:50 PM  Margins Unattached edges (unapproximated) 02/14/2014  8:57 AM  Closure None 02/14/2014  8:57 AM  Drainage Amount Minimal 02/14/2014  8:57 AM  Drainage Description No odor;Serosanguineous 02/14/2014  8:57 AM  Non-staged Wound Description Full thickness 02/14/2014  8:57 AM   Treatment Debridement (Selective);Hydrotherapy (Pulse lavage);Packing (Plain strip) 02/14/2014  8:57 AM   Hydrotherapy Pulsed lavage therapy - wound location: Lt knee Pulsed Lavage with Suction (psi): 4 psi Pulsed Lavage with Suction - Normal Saline Used: 1000 mL Pulsed Lavage Tip: Tunneling tip Selective Debridement Selective Debridement - Location: same Selective Debridement - Tools Used: Forceps;Scalpel Selective Debridement - Tissue Removed: yellow necrotic tissue   Wound Assessment and Plan  Wound Therapy - Assess/Plan/Recommendations Wound Therapy - Clinical Statement: Edema and erythema both improved compared to 8/1. MD had expressed wound ~1 hour prior to hydrotherapy with much less purulent drainage removed during pulse lavage. Noted area proximally where wound margin is beginning to turn under and used forceps to minimize epibole. Wound Therapy - Functional Problem List: limited knee flexion and mobility due to pain Factors Delaying/Impairing Wound Healing: Infection - systemic/local;Immobility Hydrotherapy Plan: Debridement;Dressing change;Patient/family education;Pulsatile lavage with suction Wound Therapy - Frequency: Other (comment) (7x/week) Wound Therapy - Follow Up Recommendations: [name] (vs MD office (due to deep pocket needs packing)) Wound Plan: see above  Wound Therapy Goals- Improve the function of patient's integumentary system by progressing the wound(s) through the phases of wound healing (inflammation - proliferation - remodeling) by: Decrease Necrotic Tissue to: 10 Decrease Necrotic Tissue - Progress: Progressing toward goal Increase Granulation Tissue to: 90 Increase Granulation Tissue - Progress: Progressing toward goal Improve Drainage Characteristics: Min;Serous Improve Drainage Characteristics - Progress: Progressing toward goal  Goals will be updated until maximal potential achieved or discharge criteria met.  Discharge criteria: when  goals  achieved, discharge from hospital, MD decision/surgical intervention, no progress towards goals, refusal/missing three consecutive treatments without notification or medical reason.  GP     [name] 02/14/2014, 9:05 AM Pager [phone]

## 2014-02-14 NOTE — Progress Notes (Signed)
[facility] Progress Note   Jesse Pope LYY:[id number] DOB: March 10, 1951 DOA: 02/08/2014 PCP:  Melinda Crutch, MD  Brief narrative: Jesse Pope is a 63 y.o. male  presents with prepatellar bursitis and cellulitis left leg   Subjective: No complaints - only requiring Vicodin 1 tab BID.  Assessment/Plan: Septic prepatellar bursitis of left knee -MRSA--Left lower extremity cellulitis --sepsis - Status post aspiration per orthopedic/Dr. supple on 7/27  - s/p  I&D and bursectomy on 7/29 - per ID, cont Vanc while hospitalized and then switch to Doxy for total of 3 wk course - Continue pain management - added Vicodin as pt had no oral pain meds ordered - printed out prescriptions for Vicodin and Doxycycline (2 more wks)  - needs outpt PT - I have ordered  Active Problems:   Hyponatremia  - sodium or 121 on admission- may be due to beer drinking -No significant change with fluid restriction on admission.  -Urine sodium is less than 20, more consistent with volume depletion  -improved to 130 with hydration -  D/'c  IVF 7/30- eating and drinking well  Alcohol abuse  -d/c CIWA protocol now as no evidence of withdrawal   Tobacco abuse  -Counseled to stop smoking  HTN Cont Ramapril  Right leg Basal cell cancer - cont Tazorac    Code Status: Full code Family Communication: with wife Disposition Plan: home with home health once OK with Ortho- hopefully tomorrow  Consultants: ortho  Procedures: I and D and bursectomy  7/29  Antibiotics: Anti-infectives   Start     Dose/Rate Route Frequency Ordered Stop   02/14/14 0000  doxycycline (VIBRA-TABS) 100 MG tablet     100 mg Oral 2 times daily 02/14/14 1106     02/11/14 1400  vancomycin (VANCOCIN) 1,250 mg in sodium chloride 0.9 % 250 mL IVPB     1,250 mg 166.7 mL/hr over 90 Minutes Intravenous Every 12 hours 02/11/14 0808     02/09/14 0200  vancomycin (VANCOCIN) IVPB 750 mg/150 ml premix  Status:  Discontinued      750 mg 150 mL/hr over 60 Minutes Intravenous Every 12 hours 02/08/14 1439 02/11/14 0808   02/08/14 1245  vancomycin (VANCOCIN) 1,500 mg in sodium chloride 0.9 % 500 mL IVPB     1,500 mg 250 mL/hr over 120 Minutes Intravenous  Once 02/08/14 1243 02/08/14 1525       DVT prophylaxis: Heparin  Objective: Filed Weights   02/08/14 1414  Weight: 69.809 kg (153 lb 14.4 oz)    Intake/Output Summary (Last 24 hours) at 02/14/14 1113 Last data filed at 02/14/14 0800  Gross per 24 hour  Intake   2330 ml  Output    300 ml  Net   2030 ml     Vitals Filed Vitals:   02/13/14 0602 02/13/14 1504 02/13/14 2218 02/14/14 0511  BP: 128/75 133/73 126/56 140/71  Pulse: 75 78 72 78  Temp: 98.4 F (36.9 C) 98.3 F (36.8 C) 98.6 F (37 C) 98.7 F (37.1 C)  TempSrc: Oral Oral Oral Oral  Resp: 18 20 16 12   Height:      Weight:      SpO2: 96% 98% 99% 98%    Exam: General: No acute respiratory distress Lungs: Clear to auscultation bilaterally without wheezes or crackles Cardiovascular: Regular rate and rhythm without murmur gallop or rub normal S1 and S2 Abdomen: Nontender, nondistended, soft, bowel sounds positive, no rebound, no ascites, no appreciable mass Extremities: No significant  cyanosis, clubbing- right  knee in dressing  Data Reviewed: Basic Metabolic Panel:  Recent Labs Lab 02/09/14 0442 02/10/14 0550 02/11/14 0654 02/12/14 0523 02/14/14 0315  NA 122* 131* 130* 130* 131*  K 4.2 4.8 4.1 4.0 3.9  CL 86* 92* 91* 91* 93*  CO2 23 25 26 26 23   GLUCOSE 93 108* 104* 92 96  BUN 17 13 10 8 7   CREATININE 0.95 0.91 0.84 0.87 0.77  CALCIUM 8.4 8.5 8.3* 7.9* 7.8*   Liver Function Tests:  Recent Labs Lab 02/08/14 1345 02/09/14 0442  AST 18 28  ALT 14 19  ALKPHOS 82 70  BILITOT 1.3* 1.0  PROT 6.6 6.6  ALBUMIN 2.7* 2.6*   No results found for this basename: LIPASE, AMYLASE,  in the last 168 hours No results found for this basename: AMMONIA,  in the last 168  hours CBC:  Recent Labs Lab 02/08/14 1000  02/09/14 0442 02/10/14 0550 02/11/14 0654 02/12/14 0523 02/14/14 0315  WBC 16.9*  < > 13.4* 15.0* 12.1* 9.9 7.8  NEUTROABS 13.9*  --   --   --   --   --  4.9  HGB 14.1  < > 12.5* 12.5* 12.0* 11.0* 11.0*  HCT 39.2  < > 34.9* 36.2* 34.4* 32.0* 32.2*  MCV 91.0  < > 89.5 90.7 90.3 90.7 89.4  PLT 252  < > 276 311 329 364 423*  < > = values in this interval not displayed. Cardiac Enzymes:  Recent Labs Lab 02/08/14 1000  CKTOTAL 32   BNP (last 3 results) No results found for this basename: PROBNP,  in the last 8760 hours CBG: No results found for this basename: GLUCAP,  in the last 168 hours  Recent Results (from the past 240 hour(s))  CULTURE, BLOOD (ROUTINE X 2)     Status: None   Collection Time    02/08/14 10:00 AM      Result Value Ref Range Status   Specimen Description BLOOD RIGHT ANTECUBITAL   Final   Special Requests BOTTLES DRAWN AEROBIC AND ANAEROBIC [facility]   Final   Culture  Setup Time     Final   Value: 02/08/2014 16:15     Performed at [facility]   Culture     Final   Value: NO GROWTH 5 DAYS     Performed at [facility]   Report Status 02/14/2014 FINAL   Final  CULTURE, BLOOD (ROUTINE X 2)     Status: None   Collection Time    02/08/14 10:05 AM      Result Value Ref Range Status   Specimen Description BLOOD LEFT FOREARM   Final   Special Requests BOTTLES DRAWN AEROBIC AND ANAEROBIC [facility]   Final   Culture  Setup Time     Final   Value: 02/08/2014 16:15     Performed at [facility]   Culture     Final   Value: NO GROWTH 5 DAYS     Performed at [facility]   Report Status 02/14/2014 FINAL   Final  BODY FLUID CULTURE     Status: None   Collection Time    02/08/14 12:25 PM      Result Value Ref Range Status   Specimen Description SYNOVIAL FLUID LEFT KNEE   Final   Special Requests NONE   Final   Gram Stain     Final   Value: ABUNDANT WBC PRESENT,BOTH PMN AND MONONUCLEAR  ABUNDANT GRAM POSITIVE COCCI     IN PAIRS IN CLUSTERS Performed at [facility]     Performed at [facility]   Culture     Final   Value: ABUNDANT METHICILLIN RESISTANT STAPHYLOCOCCUS AUREUS     BUMPER 02/10/14 0825 BY SMITHERSJ Note: RIFAMPIN AND GENTAMICIN SHOULD NOT BE USED AS SINGLE DRUGS FOR TREATMENT OF STAPH INFECTIONS. This organism DOES NOT demonstrate inducible Clindamycin resistance in vitro. CRITICAL RESULT CALLED TO, READ BACK BY AND      VERIFIED WITH: KAREN LOWE     Performed at [facility]   Report Status 02/10/2014 FINAL   Final   Organism ID, Bacteria METHICILLIN RESISTANT STAPHYLOCOCCUS AUREUS   Final  GRAM STAIN     Status: None   Collection Time    02/08/14 12:25 PM      Result Value Ref Range Status   Specimen Description SYNOVIAL FLUID LEFT KNEE   Final   Special Requests NONE   Final   Gram Stain     Final   Value: ABUNDANT WBC PRESENT,BOTH PMN AND MONONUCLEAR     ABUNDANT GRAM POSITIVE COCCI IN PAIRS IN CLUSTERS   Report Status 02/08/2014 FINAL   Final     Studies:  Recent x-ray studies have been reviewed in detail by the Attending Physician  Scheduled Meds:  Scheduled Meds: . amLODipine  10 mg Oral Daily  . folic acid  1 mg Oral Daily  . heparin  5,000 Units Subcutaneous 3 times per day  . hydrocerin   Topical BID  . imiquimod  1 application Topical QHS  . multivitamin with minerals  1 tablet Oral Daily  . ramipril  5 mg Oral Daily  . tazarotene  1 application Topical q morning - 10a  . thiamine  100 mg Oral Daily  . vancomycin  1,250 mg Intravenous Q12H   Continuous Infusions:    Time spent on care of this patient: 35 min   [place], MD 02/14/2014, 11:13 AM  LOS: 6 days   [facility] Office  [phone] Pager - Text Page per [email or web address]  If 7PM-7AM, please contact night-coverage [email or web address]

## 2014-02-14 NOTE — Progress Notes (Addendum)
ANTIBIOTIC CONSULT NOTE - Follow Up   Pharmacy Consult for oritavancin Indication: septic bursitis  Allergies  Allergen Reactions  . Penicillins Rash    Patient Measurements: Height: 5\' 11"  (180.3 cm) Weight: 153 lb 14.4 oz (69.809 kg) IBW/kg (Calculated) : 75.3  Body Weight: 70.9 kg  Vital Signs: Temp: 98.2 F (36.8 C) (08/02 1348) Temp src: Oral (08/02 1348) BP: 113/75 mmHg (08/02 1348) Pulse Rate: 78 (08/02 1348) Intake/Output from previous day: 08/01 0701 - 08/02 0700 In: 2250 [P.O.:1000; IV Piggyback:1250] Out: 950 [Urine:950] Intake/Output from this shift: Total I/O In: 880 [P.O.:880] Out: 750 [Urine:750]  Labs:  Recent Labs  02/12/14 0523 02/14/14 0315  WBC 9.9 7.8  HGB 11.0* 11.0*  PLT 364 423*  CREATININE 0.87 0.77   Estimated Creatinine Clearance: 93.3 ml/min (by C-G formula based on Cr of 0.77).  Recent Labs  02/13/14 1313  VANCOTROUGH 17.4     Medications:  See med history  Assessment: 63 yo has been here for septic prepatellar bursitis of left knee and LLE cellulitis. ID saw a few days ago and recommended IV vanc while here and PO doxy at discharge to complete 21 days. I spoke to Dr. Megan Salon today to see if we can give him one dose of Oritavancin because of its extremely long half life and PO would not be needed at discharge. He agreed to the plan. We'll give oritavancin x1 and that is it for his course of abx.  Plan:   Dc vanc Oritavancin 1200mg  IV x1 over 3 hrs Abx completed, no need for further Doxycycline

## 2014-02-15 ENCOUNTER — Encounter (HOSPITAL_COMMUNITY)
Admission: EM | Disposition: A | Discharge status: Home / Self Care | Payer: Self-pay | Source: Home / Self Care | Attending: Internal Medicine

## 2014-02-15 ENCOUNTER — Inpatient Hospital Stay (HOSPITAL_COMMUNITY): Payer: 59 | Admitting: Anesthesiology

## 2014-02-15 ENCOUNTER — Encounter (HOSPITAL_COMMUNITY): Payer: Self-pay | Admitting: Anesthesiology

## 2014-02-15 ENCOUNTER — Encounter (HOSPITAL_COMMUNITY): Payer: 59 | Admitting: Anesthesiology

## 2014-02-15 DIAGNOSIS — I10 Essential (primary) hypertension: Secondary | ICD-10-CM

## 2014-02-15 HISTORY — PX: I&D EXTREMITY: SHX5045

## 2014-02-15 SURGERY — IRRIGATION AND DEBRIDEMENT EXTREMITY
Anesthesia: General | Site: Knee | Laterality: Left

## 2014-02-15 MED ORDER — FENTANYL CITRATE 0.05 MG/ML IJ SOLN
INTRAMUSCULAR | Status: AC
  Filled 2014-02-15: qty 5

## 2014-02-15 MED ORDER — DEXAMETHASONE SODIUM PHOSPHATE 4 MG/ML IJ SOLN
INTRAMUSCULAR | Status: DC | PRN
  Administered 2014-02-15: 4 mg via INTRAVENOUS

## 2014-02-15 MED ORDER — ONDANSETRON HCL 4 MG/2ML IJ SOLN
INTRAMUSCULAR | Status: DC | PRN
  Administered 2014-02-15: 4 mg via INTRAVENOUS

## 2014-02-15 MED ORDER — DEXAMETHASONE SODIUM PHOSPHATE 4 MG/ML IJ SOLN
INTRAMUSCULAR | Status: AC
  Filled 2014-02-15: qty 1

## 2014-02-15 MED ORDER — MIDAZOLAM HCL 2 MG/2ML IJ SOLN
INTRAMUSCULAR | Status: AC
  Filled 2014-02-15: qty 2

## 2014-02-15 MED ORDER — HYDROMORPHONE HCL PF 1 MG/ML IJ SOLN
INTRAMUSCULAR | Status: AC
  Filled 2014-02-15: qty 1

## 2014-02-15 MED ORDER — METOCLOPRAMIDE HCL 10 MG PO TABS: 5.0000 mg | ORAL_TABLET | Freq: Three times a day (TID) | ORAL | Status: DC | PRN

## 2014-02-15 MED ORDER — PROPOFOL 10 MG/ML IV BOLUS
INTRAVENOUS | Status: AC
  Filled 2014-02-15: qty 20

## 2014-02-15 MED ORDER — FENTANYL CITRATE 0.05 MG/ML IJ SOLN
INTRAMUSCULAR | Status: DC | PRN
  Administered 2014-02-15 (×3): 50 ug via INTRAVENOUS

## 2014-02-15 MED ORDER — MIDAZOLAM HCL 2 MG/2ML IJ SOLN
0.5000 mg | Freq: Once | INTRAMUSCULAR | Status: AC
  Administered 2014-02-15: 0.5 mg via INTRAVENOUS

## 2014-02-15 MED ORDER — CHLORHEXIDINE GLUCONATE 4 % EX LIQD: 60.0000 mL | Freq: Once | CUTANEOUS | Status: DC

## 2014-02-15 MED ORDER — PROPOFOL 10 MG/ML IV BOLUS
INTRAVENOUS | Status: DC | PRN
  Administered 2014-02-15: 180 mg via INTRAVENOUS

## 2014-02-15 MED ORDER — CEFAZOLIN SODIUM-DEXTROSE 2-3 GM-% IV SOLR
INTRAVENOUS | Status: AC
Start: 2014-02-15 — End: 2014-02-16
  Filled 2014-02-15: qty 50

## 2014-02-15 MED ORDER — MIDAZOLAM HCL 5 MG/5ML IJ SOLN
INTRAMUSCULAR | Status: DC | PRN
  Administered 2014-02-15 (×2): 1 mg via INTRAVENOUS

## 2014-02-15 MED ORDER — METOCLOPRAMIDE HCL 5 MG/ML IJ SOLN: 5.0000 mg | Freq: Three times a day (TID) | INTRAMUSCULAR | Status: DC | PRN

## 2014-02-15 MED ORDER — ONDANSETRON HCL 4 MG/2ML IJ SOLN: 4.0000 mg | Freq: Four times a day (QID) | INTRAMUSCULAR | Status: DC | PRN

## 2014-02-15 MED ORDER — HYDROMORPHONE HCL PF 1 MG/ML IJ SOLN
0.2500 mg | INTRAMUSCULAR | Status: DC | PRN
  Administered 2014-02-15 (×4): 0.5 mg via INTRAVENOUS

## 2014-02-15 MED ORDER — LIDOCAINE HCL (CARDIAC) 20 MG/ML IV SOLN
INTRAVENOUS | Status: AC
  Filled 2014-02-15: qty 5

## 2014-02-15 MED ORDER — SODIUM CHLORIDE 0.9 % IR SOLN
Status: DC | PRN
  Administered 2014-02-15: 1

## 2014-02-15 MED ORDER — LIDOCAINE HCL (CARDIAC) 20 MG/ML IV SOLN
INTRAVENOUS | Status: DC | PRN
  Administered 2014-02-15: 70 mg via INTRAVENOUS

## 2014-02-15 MED ORDER — CEFAZOLIN SODIUM-DEXTROSE 2-3 GM-% IV SOLR
2.0000 g | Freq: Once | INTRAVENOUS | Status: AC
Start: 2014-02-15 — End: 2014-02-15
  Administered 2014-02-15: 2 g via INTRAVENOUS
  Filled 2014-02-15: qty 50

## 2014-02-15 MED ORDER — LACTATED RINGERS IV SOLN
INTRAVENOUS | Status: DC | PRN
  Administered 2014-02-15: 18:00:00 via INTRAVENOUS

## 2014-02-15 MED ORDER — ONDANSETRON HCL 4 MG/2ML IJ SOLN
INTRAMUSCULAR | Status: AC
  Filled 2014-02-15: qty 2

## 2014-02-15 MED ORDER — CEFAZOLIN SODIUM-DEXTROSE 2-3 GM-% IV SOLR: 2.0000 g | Freq: Once | INTRAVENOUS | Status: DC

## 2014-02-15 MED ORDER — ROCURONIUM BROMIDE 50 MG/5ML IV SOLN
INTRAVENOUS | Status: AC
  Filled 2014-02-15: qty 1

## 2014-02-15 MED ORDER — HYDROCODONE-ACETAMINOPHEN 5-325 MG PO TABS
ORAL_TABLET | ORAL | Status: AC
  Filled 2014-02-15: qty 2

## 2014-02-15 MED ORDER — ONDANSETRON HCL 4 MG PO TABS: 4.0000 mg | ORAL_TABLET | Freq: Four times a day (QID) | ORAL | Status: DC | PRN

## 2014-02-15 SURGICAL SUPPLY — 43 items
BANDAGE ELASTIC 6 VELCRO ST LF (GAUZE/BANDAGES/DRESSINGS) ×2 IMPLANT
BLADE SURG 10 STRL SS (BLADE) ×2 IMPLANT
BNDG COHESIVE 4X5 TAN STRL (GAUZE/BANDAGES/DRESSINGS) ×2 IMPLANT
BNDG COHESIVE 6X5 TAN STRL LF (GAUZE/BANDAGES/DRESSINGS) ×4 IMPLANT
BNDG GAUZE STRTCH 6 (GAUZE/BANDAGES/DRESSINGS) ×6 IMPLANT
COTTON STERILE ROLL (GAUZE/BANDAGES/DRESSINGS) ×2 IMPLANT
COVER SURGICAL LIGHT HANDLE (MISCELLANEOUS) ×2 IMPLANT
CUFF TOURNIQUET SINGLE 18IN (TOURNIQUET CUFF) ×2 IMPLANT
CUFF TOURNIQUET SINGLE 24IN (TOURNIQUET CUFF) IMPLANT
DRAPE U-SHAPE 47X51 STRL (DRAPES) ×2 IMPLANT
DRSG ADAPTIC 3X8 NADH LF (GAUZE/BANDAGES/DRESSINGS) ×2 IMPLANT
DRSG PAD ABDOMINAL 8X10 ST (GAUZE/BANDAGES/DRESSINGS) ×2 IMPLANT
DURAPREP 26ML APPLICATOR (WOUND CARE) ×2 IMPLANT
ELECT CAUTERY BLADE 6.4 (BLADE) IMPLANT
ELECT REM PT RETURN 9FT ADLT (ELECTROSURGICAL)
ELECTRODE REM PT RTRN 9FT ADLT (ELECTROSURGICAL) IMPLANT
GAUZE SPONGE 4X4 12PLY STRL (GAUZE/BANDAGES/DRESSINGS) ×2 IMPLANT
GLOVE BIO SURGEON STRL SZ7.5 (GLOVE) ×2 IMPLANT
GLOVE BIO SURGEON STRL SZ8 (GLOVE) ×2 IMPLANT
GLOVE EUDERMIC 7 POWDERFREE (GLOVE) ×2 IMPLANT
GLOVE SS BIOGEL STRL SZ 7.5 (GLOVE) ×1 IMPLANT
GLOVE SUPERSENSE BIOGEL SZ 7.5 (GLOVE) ×1
GOWN STRL REUS W/ TWL LRG LVL3 (GOWN DISPOSABLE) ×1 IMPLANT
GOWN STRL REUS W/ TWL XL LVL3 (GOWN DISPOSABLE) ×2 IMPLANT
GOWN STRL REUS W/TWL LRG LVL3 (GOWN DISPOSABLE) ×1
GOWN STRL REUS W/TWL XL LVL3 (GOWN DISPOSABLE) ×2
KIT BASIN OR (CUSTOM PROCEDURE TRAY) ×2 IMPLANT
KIT ROOM TURNOVER OR (KITS) ×2 IMPLANT
MANIFOLD NEPTUNE II (INSTRUMENTS) ×2 IMPLANT
NS IRRIG 1000ML POUR BTL (IV SOLUTION) ×2 IMPLANT
PACK ORTHO EXTREMITY (CUSTOM PROCEDURE TRAY) ×2 IMPLANT
PAD ARMBOARD 7.5X6 YLW CONV (MISCELLANEOUS) ×4 IMPLANT
PADDING CAST COTTON 6X4 STRL (CAST SUPPLIES) ×2 IMPLANT
SPONGE GAUZE 4X4 12PLY STER LF (GAUZE/BANDAGES/DRESSINGS) ×2 IMPLANT
SPONGE LAP 18X18 X RAY DECT (DISPOSABLE) ×2 IMPLANT
STOCKINETTE IMPERVIOUS 9X36 MD (GAUZE/BANDAGES/DRESSINGS) ×2 IMPLANT
TOWEL OR 17X24 6PK STRL BLUE (TOWEL DISPOSABLE) ×2 IMPLANT
TOWEL OR 17X26 10 PK STRL BLUE (TOWEL DISPOSABLE) ×2 IMPLANT
TUBE ANAEROBIC SPECIMEN COL (MISCELLANEOUS) IMPLANT
TUBE CONNECTING 12X1/4 (SUCTIONS) ×2 IMPLANT
UNDERPAD 30X30 INCONTINENT (UNDERPADS AND DIAPERS) ×2 IMPLANT
WATER STERILE IRR 1000ML POUR (IV SOLUTION) ×2 IMPLANT
YANKAUER SUCT BULB TIP NO VENT (SUCTIONS) ×2 IMPLANT

## 2014-02-15 NOTE — Anesthesia Postprocedure Evaluation (Signed)
  Anesthesia Post-op Note  Patient: Jesse Pope  Procedure(s) Performed: Procedure(s): IRRIGATION AND DEBRIDEMENT EXTREMITY/DELAY CLOSURE OF LEFT KNEE (Left)  Patient Location: PACU  Anesthesia Type:General  Level of Consciousness: awake  Airway and Oxygen Therapy: Patient Spontanous Breathing  Post-op Pain: mild  Post-op Assessment: Post-op Vital signs reviewed  Post-op Vital Signs: Reviewed  Last Vitals:  Filed Vitals:   02/15/14 2054  BP: 128/78  Pulse: 87  Temp: 36.8 C  Resp: 16    Complications: No apparent anesthesia complications

## 2014-02-15 NOTE — Op Note (Signed)
02/08/2014 - 02/15/2014  7:21 PM  PATIENT:   Jesse Pope  63 y.o. male  PRE-OPERATIVE DIAGNOSIS:  Infected Left Knee prepatellar bursae  POST-OPERATIVE DIAGNOSIS:  same  PROCEDURE:  I and D left knee prepatellar bursae, delayed primary closure  SURGEON:  Danette Weinfeld, Metta Clines M.D.  ASSISTANTS: Shuford pac   ANESTHESIA:   GET  EBL: min  SPECIMEN:  none  Drains: hemovac x2   PATIENT DISPOSITION:  PACU - hemodynamically stable.    PLAN OF CARE: Admit to inpatient  Drains out in am Knee immobilizer WBAT  Dictation# [id number]

## 2014-02-15 NOTE — Progress Notes (Signed)
[facility] for Infectious Disease    Date of Admission:  02/08/2014           Total days of antibiotics 7        Oritivancin given x 1 02/14/14        Vancomycin discontinued 02/14/14         Principal Problem:   Septic infrapatellar bursitis of left knee Active Problems:   Hyponatremia   HTN (hypertension)   Cigarette smoker   Eczema   Basal cell carcinoma of lower leg   . amLODipine  10 mg Oral Daily  . folic acid  1 mg Oral Daily  . heparin  5,000 Units Subcutaneous 3 times per day  . hydrocerin   Topical BID  . imiquimod  1 application Topical QHS  . multivitamin with minerals  1 tablet Oral Daily  . ramipril  5 mg Oral Daily  . tazarotene  1 application Topical q morning - 10a  . thiamine  100 mg Oral Daily    Subjective: [name] has no complaints this morning. He did say that he had nausea yesterday after administration of Oritivancin. No further issues at this time.    Past Medical History  Diagnosis Date  . Basal cell carcinoma of right lower leg   . Hypertension     History  Substance Use Topics  . Smoking status: Current Every Day Smoker  . Smokeless tobacco: Not on file  . Alcohol Use: Yes    History reviewed. No pertinent family history.  Allergies  Allergen Reactions  . Penicillins Rash    Objective: Temp:  [98.2 F (36.8 C)-98.7 F (37.1 C)] 98.2 F (36.8 C) (08/03 0618) Pulse Rate:  [78-82] 82 (08/03 0618) Resp:  [18-20] 20 (08/03 0618) BP: (113-147)/(67-75) 134/72 mmHg (08/03 0618) SpO2:  [95 %-100 %] 95 % (08/03 0618)  General: Alert, cooperative, no acute distress. In good spirits.  Skin: Eczema noted on lower extremities bilaterally. Right lower extremity pretibial erythema (basal cell ca). Lungs: Clear to auscultation bilaterally. Cor: Regular rate and rhythm, S1/S2 heard, no murmurs..  Extremities: R knee w/ large bulky dressing, appears clean, dry, and intact.   Lab Results Lab Results  Component Value Date     WBC 7.8 02/14/2014   HGB 11.0* 02/14/2014   HCT 32.2* 02/14/2014   MCV 89.4 02/14/2014   PLT 423* 02/14/2014    Lab Results  Component Value Date   CREATININE 0.77 02/14/2014   BUN 7 02/14/2014   NA 131* 02/14/2014   K 3.9 02/14/2014   CL 93* 02/14/2014   CO2 23 02/14/2014    Lab Results  Component Value Date   ALT 19 02/09/2014   AST 28 02/09/2014   ALKPHOS 70 02/09/2014   BILITOT 1.0 02/09/2014      Microbiology: Recent Results (from the past 240 hour(s))  CULTURE, BLOOD (ROUTINE X 2)     Status: None   Collection Time    02/08/14 10:00 AM      Result Value Ref Range Status   Specimen Description BLOOD RIGHT ANTECUBITAL   Final   Special Requests BOTTLES DRAWN AEROBIC AND ANAEROBIC [facility]   Final   Culture  Setup Time     Final   Value: 02/08/2014 16:15     Performed at [facility]   Culture     Final   Value: NO GROWTH 5 DAYS     Performed at [facility]  Report Status 02/14/2014 FINAL   Final  CULTURE, BLOOD (ROUTINE X 2)     Status: None   Collection Time    02/08/14 10:05 AM      Result Value Ref Range Status   Specimen Description BLOOD LEFT FOREARM   Final   Special Requests BOTTLES DRAWN AEROBIC AND ANAEROBIC [facility]   Final   Culture  Setup Time     Final   Value: 02/08/2014 16:15     Performed at [facility]   Culture     Final   Value: NO GROWTH 5 DAYS     Performed at [facility]   Report Status 02/14/2014 FINAL   Final  BODY FLUID CULTURE     Status: None   Collection Time    02/08/14 12:25 PM      Result Value Ref Range Status   Specimen Description SYNOVIAL FLUID LEFT KNEE   Final   Special Requests NONE   Final   Gram Stain     Final   Value: ABUNDANT WBC PRESENT,BOTH PMN AND MONONUCLEAR     ABUNDANT GRAM POSITIVE COCCI     IN PAIRS IN CLUSTERS Performed at [facility]     Performed at [facility]   Culture     Final   Value: ABUNDANT METHICILLIN RESISTANT STAPHYLOCOCCUS AUREUS     BUMPER 02/10/14 0825 BY  SMITHERSJ Note: RIFAMPIN AND GENTAMICIN SHOULD NOT BE USED AS SINGLE DRUGS FOR TREATMENT OF STAPH INFECTIONS. This organism DOES NOT demonstrate inducible Clindamycin resistance in vitro. CRITICAL RESULT CALLED TO, READ BACK BY AND      VERIFIED WITH: KAREN LOWE     Performed at [facility]   Report Status 02/10/2014 FINAL   Final   Organism ID, Bacteria METHICILLIN RESISTANT STAPHYLOCOCCUS AUREUS   Final  GRAM STAIN     Status: None   Collection Time    02/08/14 12:25 PM      Result Value Ref Range Status   Specimen Description SYNOVIAL FLUID LEFT KNEE   Final   Special Requests NONE   Final   Gram Stain     Final   Value: ABUNDANT WBC PRESENT,BOTH PMN AND MONONUCLEAR     ABUNDANT GRAM POSITIVE COCCI IN PAIRS IN CLUSTERS   Report Status 02/08/2014 FINAL   Final    Assessment: 63 y/o male admitted on 02/08/14 for MRSA prepatellar bursitis. Mr. Ruan is scheduled for further I&D and possible delayed closure this evening.   Plan: 1. I&D per ortho as above 2. No further antibiotics needed at this time s/p Margaretmary Dys, MD PGY-2 Pager: [phone] 02/15/2014, 10:11 AM  Attending addendum: The single dose of oritavancin given last night will give therapeutic levels against MRSA for the next 3 weeks. He should not need any further antibiotic dosing.  Michel Bickers, MD [facility] for [facility] [phone] pager   [phone] cell 02/15/2014, 3:09 PM

## 2014-02-15 NOTE — Transfer of Care (Signed)
Immediate Anesthesia Transfer of Care Note  Patient: Jesse Pope  Procedure(s) Performed: Procedure(s): IRRIGATION AND DEBRIDEMENT EXTREMITY/DELAY CLOSURE OF LEFT KNEE (Left)  Patient Location: PACU  Anesthesia Type:General  Level of Consciousness: awake, alert , oriented and patient cooperative  Airway & Oxygen Therapy: Patient Spontanous Breathing and Patient connected to nasal cannula oxygen  Post-op Assessment: Report given to PACU RN, Post -op Vital signs reviewed and stable and Patient moving all extremities X 4  Post vital signs: Reviewed and stable  Complications: No apparent anesthesia complications

## 2014-02-15 NOTE — Anesthesia Preprocedure Evaluation (Signed)
Anesthesia Evaluation  Patient identified by MRN, date of birth, ID band Patient awake    Reviewed: Allergy & Precautions, H&P , NPO status , Patient's Chart, lab work & pertinent test results  Airway Mallampati: II TM Distance: >3 FB Neck ROM: Full    Dental  (+) Teeth Intact, Dental Advisory Given   Pulmonary Current Smoker,          Cardiovascular hypertension,     Neuro/Psych    GI/Hepatic   Endo/Other    Renal/GU      Musculoskeletal   Abdominal   Peds  Hematology   Anesthesia Other Findings   Reproductive/Obstetrics                           Anesthesia Physical Anesthesia Plan  ASA: III  Anesthesia Plan: General   Post-op Pain Management:    Induction: Intravenous  Airway Management Planned: LMA  Additional Equipment:   Intra-op Plan:   Post-operative Plan:   Informed Consent: I have reviewed the patients History and Physical, chart, labs and discussed the procedure including the risks, benefits and alternatives for the proposed anesthesia with the patient or authorized representative who has indicated his/her understanding and acceptance.   Dental advisory given  Plan Discussed with: Anesthesiologist and Surgeon  Anesthesia Plan Comments:         Anesthesia Quick Evaluation

## 2014-02-15 NOTE — Progress Notes (Signed)
Physical Therapy/Hydrotherapy Discharge Patient Details Name: Jesse Pope MRN: [id number] DOB: April 19, 1951 Today's Date: 02/15/2014 Time:  -     Patient discharged from Hydrotherapy services secondary to planned return to surgery today - will need to re-order PT to resume hydrotherapy services if desired post-op.  Please see latest hydrotherapy progress note for current level of functioning and progress toward goals.    Progress and discharge plan discussed with patient and/or caregiver: Patient/Caregiver agrees with plan  GP     Donatella Walski Pager [phone]  02/15/2014, 8:48 AM

## 2014-02-15 NOTE — Progress Notes (Signed)
[facility] Progress Note   Jesse Pope NAT:[id number] DOB: 04-11-1951 DOA: 02/08/2014 PCP:  Melinda Crutch, MD  Brief narrative: Jesse Pope is a 63 y.o. male  presents with prepatellar bursitis and cellulitis left leg   Subjective: No complaints - only requiring Vicodin 1 tab BID.  Assessment/Plan: Septic prepatellar bursitis of left knee -MRSA--Left lower extremity cellulitis --sepsis - Status post aspiration per orthopedic/Dr. supple on 7/27  - s/p  I&D and bursectomy on 7/29- returning to OR today for repeat I and D and closure - per ID, initial plan was to cont Vanc while hospitalized and then switch to Doxy for total of 3 wk course- on 8/2 it was decided by ID to give him Oritavancin (3wk half life)- no further antibiotics needed - Continue pain management - added Vicodin as pt had no oral pain meds ordered - printed out prescriptions for Vicodin  - needs outpt PT - I have ordered  Active Problems:   Hyponatremia  - sodium or 121 on admission- may be due to beer drinking -No significant change with fluid restriction on admission.  -Urine sodium is less than 20, more consistent with volume depletion  -improved to 130 with hydration -  D/'c  IVF 7/30- eating and drinking well  Alcohol abuse  -d/c CIWA protocol now as no evidence of withdrawal   Tobacco abuse  -Counseled to stop smoking  HTN Cont Ramapril  Right leg Basal cell cancer - cont Tazorac    Code Status: Full code Family Communication: with wife Disposition Plan: home with outpt PT once OK with Ortho- hopefully tomorrow  Consultants: ortho  Procedures: I and D and bursectomy  7/29  Antibiotics: Anti-infectives   Start     Dose/Rate Route Frequency Ordered Stop   02/14/14 1900  Oritavancin Diphosphate (ORBACTIV) 1,200 mg in dextrose 5 % IVPB     1,200 mg 333.3 mL/hr over 180 Minutes Intravenous Once 02/14/14 1807 02/14/14 2142   02/14/14 0000  doxycycline (VIBRA-TABS) 100 MG  tablet     100 mg Oral 2 times daily 02/14/14 1106     02/11/14 1400  vancomycin (VANCOCIN) 1,250 mg in sodium chloride 0.9 % 250 mL IVPB  Status:  Discontinued     1,250 mg 166.7 mL/hr over 90 Minutes Intravenous Every 12 hours 02/11/14 0808 02/14/14 1806   02/09/14 0200  vancomycin (VANCOCIN) IVPB 750 mg/150 ml premix  Status:  Discontinued     750 mg 150 mL/hr over 60 Minutes Intravenous Every 12 hours 02/08/14 1439 02/11/14 0808   02/08/14 1245  vancomycin (VANCOCIN) 1,500 mg in sodium chloride 0.9 % 500 mL IVPB     1,500 mg 250 mL/hr over 120 Minutes Intravenous  Once 02/08/14 1243 02/08/14 1525       DVT prophylaxis: Heparin  Objective: Filed Weights   02/08/14 1414  Weight: 69.809 kg (153 lb 14.4 oz)    Intake/Output Summary (Last 24 hours) at 02/15/14 1140 Last data filed at 02/15/14 0618  Gross per 24 hour  Intake   1640 ml  Output   1150 ml  Net    490 ml     Vitals Filed Vitals:   02/14/14 0511 02/14/14 1348 02/14/14 2110 02/15/14 0618  BP: 140/71 113/75 147/67 134/72  Pulse: 78 78 79 82  Temp: 98.7 F (37.1 C) 98.2 F (36.8 C) 98.7 F (37.1 C) 98.2 F (36.8 C)  TempSrc: Oral Oral Oral Oral  Resp: 12 18 20 20   Height:  Weight:      SpO2: 98% 100% 98% 95%    Exam: General: No acute respiratory distress Lungs: Clear to auscultation bilaterally without wheezes or crackles Cardiovascular: Regular rate and rhythm without murmur gallop or rub normal S1 and S2 Abdomen: Nontender, nondistended, soft, bowel sounds positive, no rebound, no ascites, no appreciable mass Extremities: No significant cyanosis, clubbing- right  knee in dressing  Data Reviewed: Basic Metabolic Panel:  Recent Labs Lab 02/09/14 0442 02/10/14 0550 02/11/14 0654 02/12/14 0523 02/14/14 0315  NA 122* 131* 130* 130* 131*  K 4.2 4.8 4.1 4.0 3.9  CL 86* 92* 91* 91* 93*  CO2 23 25 26 26 23   GLUCOSE 93 108* 104* 92 96  BUN 17 13 10 8 7   CREATININE 0.95 0.91 0.84 0.87 0.77   CALCIUM 8.4 8.5 8.3* 7.9* 7.8*   Liver Function Tests:  Recent Labs Lab 02/08/14 1345 02/09/14 0442  AST 18 28  ALT 14 19  ALKPHOS 82 70  BILITOT 1.3* 1.0  PROT 6.6 6.6  ALBUMIN 2.7* 2.6*   No results found for this basename: LIPASE, AMYLASE,  in the last 168 hours No results found for this basename: AMMONIA,  in the last 168 hours CBC:  Recent Labs Lab 02/09/14 0442 02/10/14 0550 02/11/14 0654 02/12/14 0523 02/14/14 0315  WBC 13.4* 15.0* 12.1* 9.9 7.8  NEUTROABS  --   --   --   --  4.9  HGB 12.5* 12.5* 12.0* 11.0* 11.0*  HCT 34.9* 36.2* 34.4* 32.0* 32.2*  MCV 89.5 90.7 90.3 90.7 89.4  PLT 276 311 329 364 423*   Cardiac Enzymes: No results found for this basename: CKTOTAL, CKMB, CKMBINDEX, TROPONINI,  in the last 168 hours BNP (last 3 results) No results found for this basename: PROBNP,  in the last 8760 hours CBG: No results found for this basename: GLUCAP,  in the last 168 hours  Recent Results (from the past 240 hour(s))  CULTURE, BLOOD (ROUTINE X 2)     Status: None   Collection Time    02/08/14 10:00 AM      Result Value Ref Range Status   Specimen Description BLOOD RIGHT ANTECUBITAL   Final   Special Requests BOTTLES DRAWN AEROBIC AND ANAEROBIC [facility]   Final   Culture  Setup Time     Final   Value: 02/08/2014 16:15     Performed at [facility]   Culture     Final   Value: NO GROWTH 5 DAYS     Performed at [facility]   Report Status 02/14/2014 FINAL   Final  CULTURE, BLOOD (ROUTINE X 2)     Status: None   Collection Time    02/08/14 10:05 AM      Result Value Ref Range Status   Specimen Description BLOOD LEFT FOREARM   Final   Special Requests BOTTLES DRAWN AEROBIC AND ANAEROBIC [facility]   Final   Culture  Setup Time     Final   Value: 02/08/2014 16:15     Performed at [place]     Final   Value: NO GROWTH 5 DAYS     Performed at [facility]   Report Status 02/14/2014 FINAL   Final  BODY FLUID CULTURE      Status: None   Collection Time    02/08/14 12:25 PM      Result Value Ref Range Status   Specimen Description SYNOVIAL FLUID LEFT KNEE  Final   Special Requests NONE   Final   Gram Stain     Final   Value: ABUNDANT WBC PRESENT,BOTH PMN AND MONONUCLEAR     ABUNDANT GRAM POSITIVE COCCI     IN PAIRS IN CLUSTERS Performed at [facility]     Performed at [facility]   Culture     Final   Value: ABUNDANT METHICILLIN RESISTANT STAPHYLOCOCCUS AUREUS     BUMPER 02/10/14 0825 BY SMITHERSJ Note: RIFAMPIN AND GENTAMICIN SHOULD NOT BE USED AS SINGLE DRUGS FOR TREATMENT OF STAPH INFECTIONS. This organism DOES NOT demonstrate inducible Clindamycin resistance in vitro. CRITICAL RESULT CALLED TO, READ BACK BY AND      VERIFIED WITH: KAREN LOWE     Performed at [facility]   Report Status 02/10/2014 FINAL   Final   Organism ID, Bacteria METHICILLIN RESISTANT STAPHYLOCOCCUS AUREUS   Final  GRAM STAIN     Status: None   Collection Time    02/08/14 12:25 PM      Result Value Ref Range Status   Specimen Description SYNOVIAL FLUID LEFT KNEE   Final   Special Requests NONE   Final   Gram Stain     Final   Value: ABUNDANT WBC PRESENT,BOTH PMN AND MONONUCLEAR     ABUNDANT GRAM POSITIVE COCCI IN PAIRS IN CLUSTERS   Report Status 02/08/2014 FINAL   Final     Studies:  Recent x-ray studies have been reviewed in detail by the Attending Physician  Scheduled Meds:  Scheduled Meds: . amLODipine  10 mg Oral Daily  . folic acid  1 mg Oral Daily  . heparin  5,000 Units Subcutaneous 3 times per day  . hydrocerin   Topical BID  . imiquimod  1 application Topical QHS  . multivitamin with minerals  1 tablet Oral Daily  . ramipril  5 mg Oral Daily  . tazarotene  1 application Topical q morning - 10a  . thiamine  100 mg Oral Daily   Continuous Infusions:    Time spent on care of this patient: 35 min   [place], MD 02/15/2014, 11:40 AM  LOS: 7 days   [facility] Office  [phone] Pager - Text Page per [email or web address]  If 7PM-7AM, please contact night-coverage [email or web address]

## 2014-02-15 NOTE — Progress Notes (Signed)
Jesse Pope  MRN: [id number] DOB/Age: 1950/08/16 63 y.o. Physician: Rada Hay Procedure: Procedure(s) (LRB): IRRIGATION AND DEBRIDEMENT EXTREMITY/PRE PATELLA BURSA (Left)     Subjective: Knee subjectively continues to improve. Pt was given new abx oritavancin yesterday which is  According to pharmacy notes only antibiotic he would need. It did cause significant nausea  Vital Signs Temp:  [98.2 F (36.8 C)-98.7 F (37.1 C)] 98.2 F (36.8 C) (08/03 0618) Pulse Rate:  [78-82] 82 (08/03 0618) Resp:  [18-20] 20 (08/03 0618) BP: (113-147)/(67-75) 134/72 mmHg (08/03 0618) SpO2:  [95 %-100 %] 95 % (08/03 0618)  Lab Results  Recent Labs  02/14/14 0315  WBC 7.8  HGB 11.0*  HCT 32.2*  PLT 423*   BMET  Recent Labs  02/14/14 0315  NA 131*  K 3.9  CL 93*  CO2 23  GLUCOSE 96  BUN 7  CREATININE 0.77  CALCIUM 7.8*   No results found for this basename: inr     Exam Wound packing removed, overall the wound continues to improve but still able to express some purulent material from 2 small areas with manual massage and ROM. Overall the tissue base deep is in good condition with viable tissue        Plan NPO for trip back to [place] for repeat I&D and hopefully delayed closure.  Nithya Meriweather for Dr.Kevin Supple 02/15/2014, 8:35 AM

## 2014-02-15 NOTE — Op Note (Signed)
NAME:  Jesse Pope, Jesse Pope NO.:  [id number]  MEDICAL RECORD NO.:  [id number]  LOCATION:  [id number]                        FACILITY:  [facility]  PHYSICIAN:  Metta Clines. Zamara Cozad, M.D.  DATE OF BIRTH:  1951/03/17  DATE OF PROCEDURE:  02/15/2014 DATE OF DISCHARGE:                              OPERATIVE REPORT   PREOPERATIVE DIAGNOSIS:  Infected left knee prepatellar bursa.  POSTOPERATIVE DIAGNOSIS:  Infected left knee prepatellar bursa.  PROCEDURES: 1. Irrigation and debridement of left knee prepatellar bursa. 2. Delayed primary closure of left knee wound.  SURGEON:  Metta Clines. Shevette Bess, M.D.  Terrence DupontOlivia Mackie A. Shuford, P.A.-C.  ANESTHESIA:  General endotracheal.  TOURNIQUET TIME:  None was used.  ESTIMATED BLOOD LOSS:  Minimal.  DRAINS:  Hemovac x2.  SPECIMENS:  None.  HISTORY:  Mr. Quebedeaux is a 63 year old gentleman, admitted last week with a left knee septic prepatellar bursitis, which was found to be growing methicillin-resistant Staph aureus, who underwent an incision and drainage, and debridement of left knee prepatellar bursa last Tuesday. He has had local wound care on the floor, but with some continued purulent drainage and was brought back to the operating room at this time for planned repeat I and D and possible delayed primary closure.  Preoperatively, I counseled Mr. Demelo regarding treatment options and risks versus benefits thereof.  Possible surgical complications were all reviewed including the potential for bleeding, infection, neurovascular injury, persistent pain, loss of motion, and possible need for additional surgery.  He understands and accepts, and agrees with the plan.  PROCEDURE IN DETAIL:  After undergoing routine preop evaluation, the patient did receive prophylactic cephalosporin and was also maintained on previously given dose of a new generation antibiotics specifically for his MRSA.  He had been maintained on vancomycin over the past  week with excellent improvement in the overall status of the left lower extremity with marked reduction in swelling, erythema and was afebrile with normalized white count.  The patient was subsequently brought to the operating room, placed supine on the operating table, underwent smooth induction of a general endotracheal anesthesia.  Left lower extremity was then sterilely prepped and draped in standard fashion. Time-out was called.  The previous wound was digitally explored, opened up and skin flaps were elevated both medially and laterally.  There were some necrotic tissue under the lateral flap consistent with some fat necrosis and these areas were all sharply debrided using a rongeur and several areas of nonviable tissues were removed with the majority of tissue showed some early granulation and some good healing potential. So, after I vigorously debrided all of the raw surfaces with a sponge and removed any necrotic tissue with a rongeur, we then pulsatile lavage, irrigated the entirety of the bursal space and released any loculations removed all nonviable tissue to all bleeding surfaces.  At this point, then Hemovac drains were brought out proximally both medially and laterally.  So, two separate drains were placed in both medial and lateral gutters.  The skin incision was then closed with a series of #1 nylon vertical mattress sutures.  I should mention that this was indeed an excisional biopsy, debriding necrotic tissue with blunt and  sharp dissection.  Wound closure then with the vertical mattress sutures of #1 nylon.  Adaptic and dry dressing was then placed across the incision.  Knee was then wrapped with Webril, dry dressing, Ace bandage, and the patient was then awakened, extubated, and taken to the recovery room in stable condition.  Postop plan is for maintaining the leg in a knee immobilizer.  He may be weightbearing as tolerated.  We will discontinue the drains in  the morning and once he has clinical stabilize, discharged home with subsequent followup in the office.  He was maintained on the antibiotic regimen as prescribed by the Infectious Disease Service.     Metta Clines. Kobe Ofallon, M.D.     KMS/MEDQ  D:  02/15/2014  T:  02/15/2014  Job:  [id number]

## 2014-02-15 NOTE — Anesthesia Procedure Notes (Signed)
Procedure Name: LMA Insertion Date/Time: 02/15/2014 6:42 PM Performed by: Susa Loffler Pre-anesthesia Checklist: Patient identified, Timeout performed, Emergency Drugs available, Suction available and Patient being monitored Patient Re-evaluated:Patient Re-evaluated prior to inductionOxygen Delivery Method: Circle system utilized Preoxygenation: Pre-oxygenation with 100% oxygen Intubation Type: IV induction LMA: LMA inserted LMA Size: 4.0 Number of attempts: 1 Placement Confirmation: positive ETCO2 and breath sounds checked- equal and bilateral Tube secured with: Tape Dental Injury: Teeth and Oropharynx as per pre-operative assessment

## 2014-02-15 NOTE — Progress Notes (Signed)
Orthopedic Tech Progress Note Patient Details:  Jesse Pope May 08, 1951 [id number]  Ortho Devices Type of Ortho Device: Knee Immobilizer Ortho Device/Splint Location: LLE Ortho Device/Splint Interventions: Ordered;Application   Braulio Bosch 02/15/2014, 9:03 PM

## 2014-02-16 NOTE — Discharge Instructions (Signed)
Keep incision dry for 5 days You may shower on day 5 Perform a daily dressing change and call for worsening signs of infection Follow up monday

## 2014-02-16 NOTE — Discharge Summary (Addendum)
Physician Discharge Summary  Jesse Pope XNA:[id number] DOB: [birth year] DOA: 02/08/2014  PCP:  Melinda Crutch, MD  Admit date: 02/08/2014 Discharge date: 02/16/2014  Time spent: >45 minutes  Recommendations for Outpatient Follow-up:  1. Cont to follow with ortho as outpt- has an appt for next week  Discharge Diagnoses:  Principal Problem:   Septic infrapatellar bursitis of left knee Active Problems:   Hyponatremia   HTN (hypertension)   Cigarette smoker   Eczema   Basal cell carcinoma of lower leg   Discharge Condition: stable Diet recommendation: heart healthy low sodium  Filed Weights   02/08/14 1414  Weight: 69.809 kg (153 lb 14.4 oz)    History of present illness:  63 y/o male a hx of HTN, tobacco and alcohol abuse who presented to the ED with 1 week hx of L knee pain with pain.Noted to have WBC of 16k. Aspiration of knee done at bedside yielding gross purulence. Orthopedic surgery was consulted for consideration for surgery and the hospitalist service consulted for admission.  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 Pt reports drinking 6-8 beers daily and daily tobacco abuse.   Hospital Course:  MRSA prepatellar bursitis of left knee -Left lower extremity cellulitis --sepsis  - Status post aspiration per orthopedic/Dr. supple on 7/27  - s/p I&D and bursectomy on 7/29- returned to OR 8/3 for repeat I and D and closure of wound - per ID, initial plan was to cont Vanc while hospitalized and then switch to Doxy for total of 3 wk course- on 8/2 it was decided by ID to give him Oritavancin (3wk half life)- no further antibiotics needed  - Continue PRN Vicodin for pain management-  - needs outpt PT - I have ordered   Active Problems:  Hyponatremia  - sodium or 121 on admission- may be due to beer drinking  -No significant change with fluid restriction on admission.  -Urine sodium is less than 20, more consistent with  volume depletion  -improved to 130 with hydration - D/'c IVF 7/30- eating and drinking well   Alcohol abuse  -d/c CIWA protocol now as no evidence of withdrawal  - advised to cut back on ETOH use  Tobacco abuse  -Counseled to stop smoking   HTN  Cont Ramapril   Right leg Basal cell cancer  - cont Tazorac   Procedures:  7/30/ 15 and 02/15/14- I and D prepatellar bursa and delayed primary closure  Consultations: Ortho  Discharge Exam: Filed Vitals:   02/16/14 0603  BP: 131/56  Pulse: 71  Temp: 98.5 F (36.9 C)  Resp: 18   Lungs: Clear to auscultation bilaterally without wheezes or crackles  Cardiovascular: Regular rate and rhythm without murmur gallop or rub normal S1 and S2  Abdomen: Nontender, nondistended, soft, bowel sounds positive, no rebound, no ascites, no appreciable mass  Extremities: No significant cyanosis, clubbing- right knee in dressing   Discharge Instructions You were cared for by a hospitalist during your hospital stay. If you have any questions about your discharge medications or the care you received while you were in the hospital after you are discharged, you can call the unit and asked to speak with the hospitalist on call if the hospitalist that took care of you is not available. Once you are discharged, your primary care physician will handle any further medical issues. Please note that NO REFILLS for any discharge medications will be authorized once  you are discharged, as it is imperative that you return to your primary care physician (or establish a relationship with a primary care physician if you do not have one) for your aftercare needs so that they can reassess your need for medications and monitor your lab values.     Medication List    STOP taking these medications       naproxen sodium 220 MG tablet  Commonly known as:  ANAPROX      TAKE these medications       amLODipine 10 MG tablet  Commonly known as:  NORVASC  Take 10 mg by mouth  daily.     HYDROcodone-acetaminophen 5-325 MG per tablet  Commonly known as:  NORCO/VICODIN  Take 1 tablet by mouth every 4 (four) hours as needed for moderate pain.     imiquimod 5 % cream  Commonly known as:  ALDARA  Apply 1 application topically at bedtime.     NONFORMULARY OR COMPOUNDED ITEM  Outpatient referral to physical therapy     ramipril 10 MG capsule  Commonly known as:  ALTACE  Take 10 mg by mouth daily.     tadalafil 5 MG tablet  Commonly known as:  CIALIS  Take 5 mg by mouth daily as needed for erectile dysfunction.     tazarotene 0.05 % gel  Commonly known as:  TAZORAC  Apply 1 application topically every morning.     triamcinolone cream 0.1 %  Commonly known as:  KENALOG  Apply 1 application topically daily.       Allergies  Allergen Reactions  . Penicillins Rash      The results of significant diagnostics from this hospitalization (including imaging, microbiology, ancillary and laboratory) are listed below for reference.    Significant Diagnostic Studies: Dg Knee Complete 4 Views Left  02/08/2014   CLINICAL DATA:  Left leg swelling and redness.  EXAM: LEFT KNEE - COMPLETE 4+ VIEW  COMPARISON:  None available.  FINDINGS: A moderate joint effusion is present. Extensive superficial swelling is present about the knee. No acute osseous abnormality is present. Atherosclerotic calcifications are present.  IMPRESSION: 1. Extensive soft tissue swelling about the left knee. Differential diagnosis includes cellulitis or superficial edema. 2. Small to moderate joint effusion. 3. No acute or focal osseous abnormality. 4. Atherosclerosis.   Electronically Signed   By: Lawrence Santiago M.D.   On: 02/08/2014 10:50    Microbiology: Recent Results (from the past 240 hour(s))  CULTURE, BLOOD (ROUTINE X 2)     Status: None   Collection Time    02/08/14 10:00 AM      Result Value Ref Range Status   Specimen Description BLOOD RIGHT ANTECUBITAL   Final   Special Requests  BOTTLES DRAWN AEROBIC AND ANAEROBIC [facility]   Final   Culture  Setup Time     Final   Value: 02/08/2014 16:15     Performed at [facility]   Culture     Final   Value: NO GROWTH 5 DAYS     Performed at [facility]   Report Status 02/14/2014 FINAL   Final  CULTURE, BLOOD (ROUTINE X 2)     Status: None   Collection Time    02/08/14 10:05 AM      Result Value Ref Range Status   Specimen Description BLOOD LEFT FOREARM   Final   Special Requests BOTTLES DRAWN AEROBIC AND ANAEROBIC [facility]   Final   Culture  Setup Time  Final   Value: 02/08/2014 16:15     Performed at [facility]   Culture     Final   Value: NO GROWTH 5 DAYS     Performed at [facility]   Report Status 02/14/2014 FINAL   Final  BODY FLUID CULTURE     Status: None   Collection Time    02/08/14 12:25 PM      Result Value Ref Range Status   Specimen Description SYNOVIAL FLUID LEFT KNEE   Final   Special Requests NONE   Final   Gram Stain     Final   Value: ABUNDANT WBC PRESENT,BOTH PMN AND MONONUCLEAR     ABUNDANT GRAM POSITIVE COCCI     IN PAIRS IN CLUSTERS Performed at [facility]     Performed at [facility]   Culture     Final   Value: ABUNDANT METHICILLIN RESISTANT STAPHYLOCOCCUS AUREUS     BUMPER 02/10/14 0825 BY SMITHERSJ Note: RIFAMPIN AND GENTAMICIN SHOULD NOT BE USED AS SINGLE DRUGS FOR TREATMENT OF STAPH INFECTIONS. This organism DOES NOT demonstrate inducible Clindamycin resistance in vitro. CRITICAL RESULT CALLED TO, READ BACK BY AND      VERIFIED WITH: KAREN LOWE     Performed at [facility]   Report Status 02/10/2014 FINAL   Final   Organism ID, Bacteria METHICILLIN RESISTANT STAPHYLOCOCCUS AUREUS   Final  GRAM STAIN     Status: None   Collection Time    02/08/14 12:25 PM      Result Value Ref Range Status   Specimen Description SYNOVIAL FLUID LEFT KNEE   Final   Special Requests NONE   Final   Gram Stain     Final   Value: ABUNDANT WBC  PRESENT,BOTH PMN AND MONONUCLEAR     ABUNDANT GRAM POSITIVE COCCI IN PAIRS IN CLUSTERS   Report Status 02/08/2014 FINAL   Final     Labs: Basic Metabolic Panel:  Recent Labs Lab 02/10/14 0550 02/11/14 0654 02/12/14 0523 02/14/14 0315  NA 131* 130* 130* 131*  K 4.8 4.1 4.0 3.9  CL 92* 91* 91* 93*  CO2 25 26 26 23   GLUCOSE 108* 104* 92 96  BUN 13 10 8 7   CREATININE 0.91 0.84 0.87 0.77  CALCIUM 8.5 8.3* 7.9* 7.8*   Liver Function Tests: No results found for this basename: AST, ALT, ALKPHOS, BILITOT, PROT, ALBUMIN,  in the last 168 hours No results found for this basename: LIPASE, AMYLASE,  in the last 168 hours No results found for this basename: AMMONIA,  in the last 168 hours CBC:  Recent Labs Lab 02/10/14 0550 02/11/14 0654 02/12/14 0523 02/14/14 0315  WBC 15.0* 12.1* 9.9 7.8  NEUTROABS  --   --   --  4.9  HGB 12.5* 12.0* 11.0* 11.0*  HCT 36.2* 34.4* 32.0* 32.2*  MCV 90.7 90.3 90.7 89.4  PLT 311 329 364 423*   Cardiac Enzymes: No results found for this basename: CKTOTAL, CKMB, CKMBINDEX, TROPONINI,  in the last 168 hours BNP: BNP (last 3 results) No results found for this basename: PROBNP,  in the last 8760 hours CBG: No results found for this basename: GLUCAP,  in the last 168 hours     Signed:  [place]  [facility] 02/16/2014, 10:31 AM

## 2014-02-16 NOTE — Progress Notes (Signed)
[facility] for [facility]ious Disease    Date of Admission:  02/08/2014           Total days of antibiotics 7        Oritivancin given x 1 02/14/14        Vancomycin discontinued 02/14/14         Principal Problem:   Septic infrapatellar bursitis of left knee Active Problems:   Hyponatremia   HTN (hypertension)   Cigarette smoker   Eczema   Basal cell carcinoma of lower leg   . amLODipine  10 mg Oral Daily  . folic acid  1 mg Oral Daily  . heparin  5,000 Units Subcutaneous 3 times per day  . hydrocerin   Topical BID  . HYDROcodone-acetaminophen      . HYDROmorphone      . HYDROmorphone      . imiquimod  1 application Topical QHS  . midazolam      . multivitamin with minerals  1 tablet Oral Daily  . ramipril  5 mg Oral Daily  . tazarotene  1 application Topical q morning - 10a  . thiamine  100 mg Oral Daily    Subjective: Mr. Carlini has no complaints this morning. Denies fever, chills, nausea, or vomiting. Patient claims that his pain is well controlled at this time.    Past Medical History  Diagnosis Date  . Basal cell carcinoma of right lower leg   . Hypertension     History  Substance Use Topics  . Smoking status: Current Every Day Smoker  . Smokeless tobacco: Not on file  . Alcohol Use: Yes    History reviewed. No pertinent family history.  Allergies  Allergen Reactions  . Penicillins Rash    Objective: Temp:  [97.8 F (36.6 C)-99.5 F (37.5 C)] 98.5 F (36.9 C) (08/04 0603) Pulse Rate:  [68-90] 71 (08/04 0603) Resp:  [10-18] 18 (08/04 0603) BP: (124-161)/(44-78) 131/56 mmHg (08/04 0603) SpO2:  [78 %-100 %] 97 % (08/04 0603)  General: Alert, cooperative, no acute distress. Skin: Eczema on lower extremities bilaterally. Right lower extremity pretibial erythema and skin changes (basal cell ca). Lungs: Clear to auscultation bilaterally. Cor: Regular rate and rhythm, S1/S2 heard, no murmurs..  Extremities: R knee w/ large brace overlying  dressing. Drain w/ serosanguinous drainage.   Lab Results Lab Results  Component Value Date   WBC 7.8 02/14/2014   HGB 11.0* 02/14/2014   HCT 32.2* 02/14/2014   MCV 89.4 02/14/2014   PLT 423* 02/14/2014    Lab Results  Component Value Date   CREATININE 0.77 02/14/2014   BUN 7 02/14/2014   NA 131* 02/14/2014   K 3.9 02/14/2014   CL 93* 02/14/2014   CO2 23 02/14/2014    Lab Results  Component Value Date   ALT 19 02/09/2014   AST 28 02/09/2014   ALKPHOS 70 02/09/2014   BILITOT 1.0 02/09/2014      Microbiology: Recent Results (from the past 240 hour(s))  CULTURE, BLOOD (ROUTINE X 2)     Status: None   Collection Time    02/08/14 10:00 AM      Result Value Ref Range Status   Specimen Description BLOOD RIGHT ANTECUBITAL   Final   Special Requests BOTTLES DRAWN AEROBIC AND ANAEROBIC [facility]   Final   Culture  Setup Time     Final   Value: 02/08/2014 16:15     Performed at [facility]  Culture     Final   Value: NO GROWTH 5 DAYS     Performed at [facility]   Report Status 02/14/2014 FINAL   Final  CULTURE, BLOOD (ROUTINE X 2)     Status: None   Collection Time    02/08/14 10:05 AM      Result Value Ref Range Status   Specimen Description BLOOD LEFT FOREARM   Final   Special Requests BOTTLES DRAWN AEROBIC AND ANAEROBIC [facility]   Final   Culture  Setup Time     Final   Value: 02/08/2014 16:15     Performed at [facility]   Culture     Final   Value: NO GROWTH 5 DAYS     Performed at [facility]   Report Status 02/14/2014 FINAL   Final  BODY FLUID CULTURE     Status: None   Collection Time    02/08/14 12:25 PM      Result Value Ref Range Status   Specimen Description SYNOVIAL FLUID LEFT KNEE   Final   Special Requests NONE   Final   Gram Stain     Final   Value: ABUNDANT WBC PRESENT,BOTH PMN AND MONONUCLEAR     ABUNDANT GRAM POSITIVE COCCI     IN PAIRS IN CLUSTERS Performed at [facility]     Performed at [facility]   Culture     Final    Value: ABUNDANT METHICILLIN RESISTANT STAPHYLOCOCCUS AUREUS     BUMPER 02/10/14 0825 BY SMITHERSJ Note: RIFAMPIN AND GENTAMICIN SHOULD NOT BE USED AS SINGLE DRUGS FOR TREATMENT OF STAPH INFECTIONS. This organism DOES NOT demonstrate inducible Clindamycin resistance in vitro. CRITICAL RESULT CALLED TO, READ BACK BY AND      VERIFIED WITH: KAREN LOWE     Performed at [facility]   Report Status 02/10/2014 FINAL   Final   Organism ID, Bacteria METHICILLIN RESISTANT STAPHYLOCOCCUS AUREUS   Final  GRAM STAIN     Status: None   Collection Time    02/08/14 12:25 PM      Result Value Ref Range Status   Specimen Description SYNOVIAL FLUID LEFT KNEE   Final   Special Requests NONE   Final   Gram Stain     Final   Value: ABUNDANT WBC PRESENT,BOTH PMN AND MONONUCLEAR     ABUNDANT GRAM POSITIVE COCCI IN PAIRS IN CLUSTERS   Report Status 02/08/2014 FINAL   Final    Assessment: 63 y/o male admitted on 02/08/14 for MRSA prepatellar bursitis. Underwent repeat I&D and delayed closure yesterday (02/15/14). S/p Oritivancin dose on 02/14/14.  Plan: 1. No further antibiotics needed s/p Oritivancin on 02/14/14 2. ID will sign off at this time.   Luanne Bras, MD PGY-2, Internal Medicine Pager [phone] 02/16/2014, 7:58 AM

## 2014-02-16 NOTE — Progress Notes (Signed)
Patient discharged to home accompanied by family. Discharge instructions and rx given and explained and patient stated understanding. IVs were removed and patient left unit in a stable condition via wheelchair with all personal belongings.

## 2014-02-16 NOTE — Progress Notes (Signed)
Jesse Pope  MRN: [id number] DOB/Age: 09-13-1950 63 y.o. Physician: Rada Hay Procedure: Procedure(s) (LRB): IRRIGATION AND DEBRIDEMENT EXTREMITY/DELAY CLOSURE OF LEFT KNEE (Left)     Subjective: Patient anxious to go home  Vital Signs Temp:  [97.8 F (36.6 C)-98.5 F (36.9 C)] 98.4 F (36.9 C) (08/04 1247) Pulse Rate:  [68-87] 75 (08/04 1247) Resp:  [10-18] 18 (08/04 0603) BP: (124-161)/(44-78) 134/68 mmHg (08/04 1247) SpO2:  [78 %-100 %] 97 % (08/04 1247)  Lab Results  Recent Labs  02/14/14 0315  WBC 7.8  HGB 11.0*  HCT 32.2*  PLT 423*   BMET  Recent Labs  02/14/14 0315  NA 131*  K 3.9  CL 93*  CO2 23  GLUCOSE 96  BUN 7  CREATININE 0.77  CALCIUM 7.8*   No results found for this basename: inr     Exam Dressing changed and drains removed All skin edges viable with minimal swelling and erythema NVI        Plan OK to DC home with close follow up and careful observation of wounds Will see in office on Monday   [facility] for Dr.Kevin Supple 02/16/2014, 2:56 PM

## 2014-02-18 ENCOUNTER — Encounter (HOSPITAL_COMMUNITY): Payer: Self-pay | Admitting: Orthopedic Surgery

## 2014-06-15 ENCOUNTER — Ambulatory Visit (INDEPENDENT_AMBULATORY_CARE_PROVIDER_SITE_OTHER): Payer: 59 | Admitting: Internal Medicine

## 2014-06-15 ENCOUNTER — Encounter: Payer: Self-pay | Admitting: Internal Medicine

## 2014-06-15 VITALS — Wt 144.0 lb

## 2014-06-15 DIAGNOSIS — A4902 Methicillin resistant Staphylococcus aureus infection, unspecified site: Secondary | ICD-10-CM

## 2014-06-15 DIAGNOSIS — M7040 Prepatellar bursitis, unspecified knee: Secondary | ICD-10-CM

## 2014-07-15 NOTE — Progress Notes (Signed)
   Subjective:    Patient ID: Jesse Pope, male    DOB: Nov 27, 1950, 62 y.o.   MRN: [id number]  HPI 63 y/o male admitted on 02/08/14 for MRSA prepatellar bursitis. Underwent repeat I&D and delayed closure yesterday (02/15/14).cx grew MRSA. He was treated with  Oritivancin dose on 02/14/14. He reports that he has been doing well since his admisison. He is now mostly getting treated for basal cell carcinoma affecting lower legs. He denies any pain to his knees. No fever, chills, nightsweats. No swelling to knees.  Current Outpatient Prescriptions on File Prior to Visit  Medication Sig Dispense Refill  . amLODipine (NORVASC) 10 MG tablet Take 10 mg by mouth daily.    Marland Kitchen HYDROcodone-acetaminophen (NORCO/VICODIN) 5-325 MG per tablet Take 1 tablet by mouth every 4 (four) hours as needed for moderate pain. 30 tablet 0  . imiquimod (ALDARA) 5 % cream Apply 1 application topically at bedtime.    . NONFORMULARY OR COMPOUNDED ITEM Outpatient referral to physical therapy 1 each 0  . ramipril (ALTACE) 10 MG capsule Take 10 mg by mouth daily.    . tadalafil (CIALIS) 5 MG tablet Take 5 mg by mouth daily as needed for erectile dysfunction.    . tazarotene (TAZORAC) 0.05 % gel Apply 1 application topically every morning.    . triamcinolone cream (KENALOG) 0.1 % Apply 1 application topically daily.     No current facility-administered medications on file prior to visit.   Active Ambulatory Problems    Diagnosis Date Noted  . Septic infrapatellar bursitis of left knee 02/08/2014  . Hyponatremia 02/08/2014  . Sepsis(995.91) 02/08/2014  . HTN (hypertension) 02/11/2014  . Cigarette smoker 02/11/2014  . Eczema 02/11/2014  . Basal cell carcinoma of lower leg 02/11/2014   Resolved Ambulatory Problems    Diagnosis Date Noted  . Arthritis, septic, knee 02/08/2014  . Cellulitis 02/08/2014   Past Medical History  Diagnosis Date  . Basal cell carcinoma of right lower leg   . Hypertension       Review of  Systems 10 point ros is negative except for rash related to skin cancer affecting legs    Objective:   Physical Exam Wt 144 lb (65.318 kg) Physical Exam  Constitutional: He is oriented to person, place, and time. He appears well-developed and well-nourished. No distress.  HENT:  Mouth/Throat: Oropharynx is clear and moist. No oropharyngeal exudate.  Cardiovascular: Normal rate, regular rhythm and normal heart sounds. Exam reveals no gallop and no friction rub.  No murmur heard.  Pulmonary/Chest: Effort normal and breath sounds normal. No respiratory distress. He has no wheezes.  Ext: no effusions or warmth to knees bilaterally Skin: numerous skin lesions, plaque like to calves bilaterally from skin cancer Psychiatric: He has a normal mood and affect. His behavior is normal.       Assessment & Plan:  MRSA prepatellar bursitis treated with oritavancin. Appears that he has been treated and no further recurrence at 4 months out from hospitalization  No need for further follow up

## 2015-01-11 ENCOUNTER — Encounter: Payer: Self-pay | Admitting: Radiation Oncology

## 2015-01-11 NOTE — Progress Notes (Signed)
Histology and Location of Primary Skin Cancer: Right Anterior Leg    Jesse Pope presented to Dr. Harriett Sine, [facility], 01/2014 "with an erythematous, irritated, scaly plaques or patches on his right anterior ankle, left posterior ankle."  He was sent for a biopsy on 01/27/2014 with resulting report of Squamous Cell Carcinoma of the right anterior leg.  Despite treatment with Imiqulmod, Efudex, Acitretin and 5-FU( 05/2014) he continues to have an erythematous, scaly, crusted papule or plasue located on his right inferior, central tibial.   Past/Anticipated interventions by patient's surgeon/dermatologist for current problematic lesion, if any: Biopsy of right anterior leg followed by Imiqulmod, Efudex, Acitretin( Quit as of 6/16/116),bathing with chlorhexidine.  Not using any medication at this time.  Past skin cancers, if any:  1) Location/Histology/Intervention: Left right anterior ankle(present)  2) Location/Histology/Intervention:   3) Location/Histology/Intervention:  History of Blistering sunburns, if any: Yes, he has in the past   SAFETY ISSUES:  Prior radiation? No  Pacemaker/ICD? No  Possible current pregnancy? N/A  Is the patient on methotrexate? No  Current Complaints / other details:

## 2015-01-12 ENCOUNTER — Encounter: Payer: Self-pay | Admitting: Radiation Oncology

## 2015-01-12 ENCOUNTER — Ambulatory Visit
Admission: RE | Admit: 2015-01-12 | Discharge: 2015-01-12 | Disposition: A | Discharge status: Home / Self Care | Payer: Commercial Managed Care - HMO | Source: Ambulatory Visit | Attending: Radiation Oncology | Admitting: Radiation Oncology

## 2015-01-12 VITALS — BP 130/74 | HR 76 | Temp 98.3°F | Ht 71.0 in | Wt 152.1 lb

## 2015-01-12 DIAGNOSIS — F17200 Nicotine dependence, unspecified, uncomplicated: Secondary | ICD-10-CM | POA: Insufficient documentation

## 2015-01-12 DIAGNOSIS — Z79899 Other long term (current) drug therapy: Secondary | ICD-10-CM | POA: Insufficient documentation

## 2015-01-12 DIAGNOSIS — C44722 Squamous cell carcinoma of skin of right lower limb, including hip: Secondary | ICD-10-CM

## 2015-01-12 DIAGNOSIS — I10 Essential (primary) hypertension: Secondary | ICD-10-CM | POA: Insufficient documentation

## 2015-01-12 DIAGNOSIS — Z85828 Personal history of other malignant neoplasm of skin: Secondary | ICD-10-CM | POA: Insufficient documentation

## 2015-01-12 NOTE — Progress Notes (Signed)
[facility] NEW PATIENT EVALUATION  Name: Jesse Pope MRN: [id number]  Date:   01/12/2015           DOB: 10-12-1950  Status: outpatient   CC:  Melinda Crutch, MD  Harriett Sine, MD    REFERRING PHYSICIAN: Harriett Sine, MD   DIAGNOSIS: The encounter diagnosis was Squamous cell carcinoma of right lower leg.    HISTORY OF PRESENT ILLNESS:  KEEAN Pope is a 64 y.o. male who is seen today through the courtesy of Dr. Pablo Ledger for consideration of radiation therapy in the management of his squamous cell carcinoma of the lower right leg.  The patient tells me that he first noted keratotic lesions along his right lower leg approximately 6 years ago.  He was concerned that he may have skin cancer and he referred himself to Dr. Elvera Lennox who performed a biopsy on 01/27/2014.  This was diagnostic for squamous cell carcinoma, keratoacanthoma--like pattern.  He has been treated with topical 5-FU and imiquimod cream.  More recently, he was placed on Acitretin but this was recently discontinued.  He has had slow progression of his disease.  Serial photographs were taken by Dr. Elvera Lennox for review.  He tells me that 3 days ago he developed an ulcer along his lateral right thigh felt to be perhaps secondary to MRSA, and he was placed on anti-biotics.  Cultures are pending.  Of note is that he has been a cigarette smoker most of his life.  PREVIOUS RADIATION THERAPY: No   PAST MEDICAL HISTORY:  has a past medical history of Basal cell carcinoma of right lower leg and Hypertension.     PAST SURGICAL HISTORY:  Past Surgical History  Procedure Laterality Date  . Back surgery    . I&d extremity Left 02/10/2014    Procedure: IRRIGATION AND DEBRIDEMENT EXTREMITY/PRE PATELLA BURSA;  Surgeon: Marin Shutter, MD;  Location: [facility];  Service: Orthopedics;  Laterality: Left;  . I&d extremity Left 02/15/2014    Procedure: IRRIGATION AND DEBRIDEMENT EXTREMITY/DELAY  CLOSURE OF LEFT KNEE;  Surgeon: Marin Shutter, MD;  Location: [facility];  Service: Orthopedics;  Laterality: Left;  . Biospy of right lower extremity  01/27/2014    SCC     FAMILY HISTORY: family history includes Colon cancer in his mother; Crohn's disease in his father.  Married, 3 children.  He is a Information systems manager.   SOCIAL HISTORY:  reports that he has been smoking.  He does not have any smokeless tobacco history on file. He reports that he drinks alcohol. He reports that he does not use illicit drugs.  His father died from natural causes and 25.  His mother died from colon cancer in her 55s.   ALLERGIES: Penicillins   MEDICATIONS:  Current Outpatient Prescriptions  Medication Sig Dispense Refill  . amLODipine (NORVASC) 10 MG tablet Take 10 mg by mouth daily.    . ramipril (ALTACE) 10 MG capsule Take 10 mg by mouth daily.    . tadalafil (CIALIS) 5 MG tablet Take 5 mg by mouth daily as needed for erectile dysfunction.     No current facility-administered medications for this encounter.     REVIEW OF SYSTEMS:  Pertinent items are noted in HPI.    PHYSICAL EXAM:  height is 5\' 11"  (1.803 m) and weight is 152 lb 1.6 oz (68.992 kg). His temperature is 98.3 F (36.8 C). His blood pressure is 130/74 and his pulse is 76.  Alert and oriented.  On inspection of the right lower extremity is a raised, indurated, and keratotic lesion measuring 25 x 16 cm in greatest diameter.  In places, the borders of the lesion are poorly defined.  Photographs were taken.  There is trace to 1+ right ankle edema.  I did not remove his right lateral thigh dressing.  There is no popliteal adenopathy.  The 3 photographs below were taken today.         LABORATORY DATA:  Lab Results  Component Value Date   WBC 7.8 02/14/2014   HGB 11.0* 02/14/2014   HCT 32.2* 02/14/2014   MCV 89.4 02/14/2014   PLT 423* 02/14/2014   Lab Results  Component Value Date   NA 131* 02/14/2014   K 3.9 02/14/2014    CL 93* 02/14/2014   CO2 23 02/14/2014   Lab Results  Component Value Date   ALT 19 02/09/2014   AST 28 02/09/2014   ALKPHOS 70 02/09/2014   BILITOT 1.0 02/09/2014      IMPRESSION: Squamous cell carcinoma of the skin involving the lower right leg.  He has fairly extensive disease.  I explained to him that possible curative options would be extensive surgery with grafting or radiation therapy.  Radiation therapy options include high-dose rate brachytherapy alone or external beam/photon therapy alone or combination of photons followed by HDR brachytherapy boost.  I discussed the potential acute and late toxicities of radiation therapy including moist desquamation and delayed healing.  I told him that I would insist that he stop smoking should he want to consider radiation therapy.  For the photon portion of his treatment he would need strict immobilization.  PLAN: I told Mr. [name] to contact his primary care physician, Dr. Harrington Challenger, to stop smoking.  After he has stop smoking, he can call me to schedule a follow-up visit.  I gave him my card.  I would also consider obtaining a MRI scan to judge the depth/thickness of his carcinoma for treatment planning purposes.  He is interested in pursuing radiation therapy after he is stop smoking.   I spent 30  minutes face to face with the patient and more than 50% of that time was spent in counseling and/or coordination of care.  Addendum: (Photographs from Dr. Elvera Lennox):

## 2015-01-13 NOTE — Addendum Note (Signed)
Encounter addended by: Benn Moulder, RN on: 01/13/2015 10:17 AM<BR>     Documentation filed: Charges VN

## 2015-01-14 NOTE — Addendum Note (Signed)
Encounter addended by: Benn Moulder, RN on: 01/14/2015  8:01 PM<BR>     Documentation filed: Arn Medal VN

## 2015-02-02 ENCOUNTER — Ambulatory Visit: Payer: Commercial Managed Care - HMO | Admitting: Radiation Oncology

## 2015-02-02 ENCOUNTER — Ambulatory Visit: Payer: Commercial Managed Care - HMO

## 2015-06-03 DIAGNOSIS — L03119 Cellulitis of unspecified part of limb: Secondary | ICD-10-CM | POA: Diagnosis not present

## 2015-09-07 ENCOUNTER — Encounter (HOSPITAL_COMMUNITY): Payer: Self-pay

## 2015-09-07 ENCOUNTER — Inpatient Hospital Stay (HOSPITAL_COMMUNITY): Payer: Medicare Other

## 2015-09-07 ENCOUNTER — Inpatient Hospital Stay (HOSPITAL_COMMUNITY)
Admission: EM | Admit: 2015-09-07 | Discharge: 2015-09-10 | DRG: 603 | Disposition: A | Discharge status: Home / Self Care | Payer: Medicare Other | Attending: Internal Medicine | Admitting: Internal Medicine

## 2015-09-07 ENCOUNTER — Emergency Department (HOSPITAL_COMMUNITY): Payer: Medicare Other

## 2015-09-07 ENCOUNTER — Emergency Department (EMERGENCY_DEPARTMENT_HOSPITAL): Payer: Medicare Other

## 2015-09-07 DIAGNOSIS — E872 Acidosis, unspecified: Secondary | ICD-10-CM | POA: Diagnosis present

## 2015-09-07 DIAGNOSIS — B9562 Methicillin resistant Staphylococcus aureus infection as the cause of diseases classified elsewhere: Secondary | ICD-10-CM | POA: Diagnosis present

## 2015-09-07 DIAGNOSIS — C801 Malignant (primary) neoplasm, unspecified: Secondary | ICD-10-CM | POA: Diagnosis not present

## 2015-09-07 DIAGNOSIS — I10 Essential (primary) hypertension: Secondary | ICD-10-CM | POA: Diagnosis present

## 2015-09-07 DIAGNOSIS — Z880 Allergy status to penicillin: Secondary | ICD-10-CM

## 2015-09-07 DIAGNOSIS — R351 Nocturia: Secondary | ICD-10-CM | POA: Diagnosis present

## 2015-09-07 DIAGNOSIS — F1010 Alcohol abuse, uncomplicated: Secondary | ICD-10-CM | POA: Diagnosis present

## 2015-09-07 DIAGNOSIS — L02415 Cutaneous abscess of right lower limb: Secondary | ICD-10-CM | POA: Diagnosis not present

## 2015-09-07 DIAGNOSIS — L03119 Cellulitis of unspecified part of limb: Secondary | ICD-10-CM | POA: Diagnosis not present

## 2015-09-07 DIAGNOSIS — L03115 Cellulitis of right lower limb: Secondary | ICD-10-CM | POA: Diagnosis present

## 2015-09-07 DIAGNOSIS — L089 Local infection of the skin and subcutaneous tissue, unspecified: Secondary | ICD-10-CM | POA: Diagnosis not present

## 2015-09-07 DIAGNOSIS — N401 Enlarged prostate with lower urinary tract symptoms: Secondary | ICD-10-CM | POA: Diagnosis present

## 2015-09-07 DIAGNOSIS — Z85828 Personal history of other malignant neoplasm of skin: Secondary | ICD-10-CM

## 2015-09-07 DIAGNOSIS — M7989 Other specified soft tissue disorders: Secondary | ICD-10-CM

## 2015-09-07 DIAGNOSIS — Z8614 Personal history of Methicillin resistant Staphylococcus aureus infection: Secondary | ICD-10-CM

## 2015-09-07 DIAGNOSIS — R918 Other nonspecific abnormal finding of lung field: Secondary | ICD-10-CM

## 2015-09-07 DIAGNOSIS — E871 Hypo-osmolality and hyponatremia: Secondary | ICD-10-CM | POA: Diagnosis present

## 2015-09-07 DIAGNOSIS — L02419 Cutaneous abscess of limb, unspecified: Secondary | ICD-10-CM | POA: Diagnosis present

## 2015-09-07 DIAGNOSIS — F17210 Nicotine dependence, cigarettes, uncomplicated: Secondary | ICD-10-CM | POA: Diagnosis present

## 2015-09-07 DIAGNOSIS — B9561 Methicillin susceptible Staphylococcus aureus infection as the cause of diseases classified elsewhere: Secondary | ICD-10-CM | POA: Diagnosis not present

## 2015-09-07 DIAGNOSIS — E875 Hyperkalemia: Secondary | ICD-10-CM | POA: Diagnosis present

## 2015-09-07 DIAGNOSIS — Z720 Tobacco use: Secondary | ICD-10-CM | POA: Diagnosis not present

## 2015-09-07 DIAGNOSIS — B9689 Other specified bacterial agents as the cause of diseases classified elsewhere: Secondary | ICD-10-CM | POA: Diagnosis not present

## 2015-09-07 HISTORY — DX: Benign prostatic hyperplasia with lower urinary tract symptoms: N40.1

## 2015-09-07 HISTORY — DX: Dermatitis, unspecified: L30.9

## 2015-09-07 HISTORY — DX: Cutaneous abscess of limb, unspecified: L02.419

## 2015-09-07 HISTORY — DX: Methicillin resistant Staphylococcus aureus infection, unspecified site: A49.02

## 2015-09-07 HISTORY — DX: Other nonspecific abnormal finding of lung field: R91.8

## 2015-09-07 HISTORY — DX: Alcohol abuse, uncomplicated: F10.10

## 2015-09-07 HISTORY — DX: Cellulitis of unspecified part of limb: L03.119

## 2015-09-07 HISTORY — DX: Nicotine dependence, cigarettes, uncomplicated: F17.210

## 2015-09-07 HISTORY — DX: Hypo-osmolality and hyponatremia: E87.1

## 2015-09-07 HISTORY — DX: Nocturia: R35.1

## 2015-09-07 LAB — COMPREHENSIVE METABOLIC PANEL
ALT: 19 U/L (ref 17–63)
AST: 26 U/L (ref 15–41)
Albumin: 4.4 g/dL (ref 3.5–5.0)
Alkaline Phosphatase: 63 U/L (ref 38–126)
Anion gap: 10 (ref 5–15)
BUN: 8 mg/dL (ref 6–20)
CO2: 23 mmol/L (ref 22–32)
Calcium: 9 mg/dL (ref 8.9–10.3)
Chloride: 95 mmol/L — ABNORMAL LOW (ref 101–111)
Creatinine, Ser: 0.92 mg/dL (ref 0.61–1.24)
GFR calc Af Amer: >60 mL/min (ref >60)
GFR calc non Af Amer: >60 mL/min (ref >60)
Glucose, Bld: 95 mg/dL (ref 65–99)
Potassium: 5.3 mmol/L — ABNORMAL HIGH (ref 3.5–5.1)
Sodium: 128 mmol/L — ABNORMAL LOW (ref 135–145)
Total Bilirubin: 1.2 mg/dL (ref 0.3–1.2)
Total Protein: 8.1 g/dL (ref 6.5–8.1)

## 2015-09-07 LAB — URINALYSIS, ROUTINE W REFLEX MICROSCOPIC
Bilirubin Urine: NEGATIVE
Glucose, UA: NEGATIVE mg/dL
Hgb urine dipstick: NEGATIVE
Ketones, ur: NEGATIVE mg/dL
Leukocytes, UA: NEGATIVE
Nitrite: NEGATIVE
Protein, ur: NEGATIVE mg/dL
Specific Gravity, Urine: 1.004 — ABNORMAL LOW (ref 1.005–1.030)
pH: 6.5 (ref 5.0–8.0)

## 2015-09-07 LAB — PHOSPHORUS: Phosphorus: 3.7 mg/dL (ref 2.5–4.6)

## 2015-09-07 LAB — BRAIN NATRIURETIC PEPTIDE: B Natriuretic Peptide: 49.8 pg/mL (ref 0.0–100.0)

## 2015-09-07 LAB — I-STAT CG4 LACTIC ACID, ED
Lactic Acid, Venous: 2.26 mmol/L (ref 0.5–2.0)
Lactic Acid, Venous: 0.44 mmol/L — ABNORMAL LOW (ref 0.5–2.0)

## 2015-09-07 LAB — MAGNESIUM: Magnesium: 2.1 mg/dL (ref 1.7–2.4)

## 2015-09-07 LAB — CBC
HCT: 40.3 % (ref 39.0–52.0)
Hemoglobin: 13.9 g/dL (ref 13.0–17.0)
MCH: 32.2 pg (ref 26.0–34.0)
MCHC: 34.5 g/dL (ref 30.0–36.0)
MCV: 93.3 fL (ref 78.0–100.0)
Platelets: 287 10*3/uL (ref 150–400)
RBC: 4.32 MIL/uL (ref 4.22–5.81)
RDW: 13.1 % (ref 11.5–15.5)
WBC: 8.5 10*3/uL (ref 4.0–10.5)

## 2015-09-07 MED ORDER — LORAZEPAM 1 MG PO TABS
1.0000 mg | ORAL_TABLET | Freq: Four times a day (QID) | ORAL | Status: DC | PRN
  Filled 2015-09-07: qty 1

## 2015-09-07 MED ORDER — FOLIC ACID 1 MG PO TABS
1.0000 mg | ORAL_TABLET | Freq: Every day | ORAL | Status: DC
  Administered 2015-09-07 – 2015-09-10 (×4): 1 mg via ORAL
  Filled 2015-09-07 (×4): qty 1

## 2015-09-07 MED ORDER — LORAZEPAM 1 MG PO TABS: 0.0000 mg | ORAL_TABLET | Freq: Four times a day (QID) | ORAL | Status: AC

## 2015-09-07 MED ORDER — SODIUM CHLORIDE 0.9 % IV BOLUS (SEPSIS)
500.0000 mL | Freq: Once | INTRAVENOUS | Status: AC
  Administered 2015-09-07: 500 mL via INTRAVENOUS

## 2015-09-07 MED ORDER — PANTOPRAZOLE SODIUM 40 MG PO TBEC
40.0000 mg | DELAYED_RELEASE_TABLET | Freq: Every day | ORAL | Status: DC
Start: 2015-09-07 — End: 2015-09-10
  Administered 2015-09-07 – 2015-09-10 (×4): 40 mg via ORAL
  Filled 2015-09-07 (×4): qty 1

## 2015-09-07 MED ORDER — RAMIPRIL 10 MG PO CAPS
10.0000 mg | ORAL_CAPSULE | Freq: Every day | ORAL | Status: DC
  Administered 2015-09-08 – 2015-09-10 (×3): 10 mg via ORAL
  Filled 2015-09-07 (×3): qty 1

## 2015-09-07 MED ORDER — ADULT MULTIVITAMIN W/MINERALS CH
1.0000 | ORAL_TABLET | Freq: Every day | ORAL | Status: DC
  Administered 2015-09-07 – 2015-09-10 (×4): 1 via ORAL
  Filled 2015-09-07 (×4): qty 1

## 2015-09-07 MED ORDER — KETOROLAC TROMETHAMINE 15 MG/ML IJ SOLN: 15.0000 mg | Freq: Three times a day (TID) | INTRAMUSCULAR | Status: DC | PRN

## 2015-09-07 MED ORDER — SODIUM CHLORIDE 0.9 % IV SOLN
INTRAVENOUS | Status: DC
  Administered 2015-09-07 – 2015-09-09 (×2): via INTRAVENOUS

## 2015-09-07 MED ORDER — LORAZEPAM 2 MG/ML IJ SOLN: 1.0000 mg | Freq: Four times a day (QID) | INTRAMUSCULAR | Status: DC | PRN

## 2015-09-07 MED ORDER — MAGNESIUM SULFATE 4 GM/100ML IV SOLN
4.0000 g | Freq: Once | INTRAVENOUS | Status: AC
  Administered 2015-09-07: 4 g via INTRAVENOUS
  Filled 2015-09-07: qty 100

## 2015-09-07 MED ORDER — VANCOMYCIN HCL IN DEXTROSE 1-5 GM/200ML-% IV SOLN
1000.0000 mg | Freq: Two times a day (BID) | INTRAVENOUS | Status: DC
  Administered 2015-09-08 – 2015-09-09 (×4): 1000 mg via INTRAVENOUS
  Filled 2015-09-07 (×5): qty 200

## 2015-09-07 MED ORDER — VITAMIN B-1 100 MG PO TABS
100.0000 mg | ORAL_TABLET | Freq: Every day | ORAL | Status: DC
  Administered 2015-09-07 – 2015-09-10 (×4): 100 mg via ORAL
  Filled 2015-09-07 (×4): qty 1

## 2015-09-07 MED ORDER — ONDANSETRON HCL 4 MG PO TABS: 4.0000 mg | ORAL_TABLET | Freq: Four times a day (QID) | ORAL | Status: DC | PRN

## 2015-09-07 MED ORDER — IOHEXOL 300 MG/ML  SOLN
75.0000 mL | Freq: Once | INTRAMUSCULAR | Status: AC | PRN
  Administered 2015-09-07: 75 mL via INTRAVENOUS

## 2015-09-07 MED ORDER — LORAZEPAM 1 MG PO TABS
0.0000 mg | ORAL_TABLET | Freq: Two times a day (BID) | ORAL | Status: DC
Start: 2015-09-10 — End: 2015-09-10

## 2015-09-07 MED ORDER — LIDOCAINE HCL 1 % IJ SOLN
20.0000 mL | Freq: Once | INTRAMUSCULAR | Status: DC
  Filled 2015-09-07: qty 20

## 2015-09-07 MED ORDER — ENOXAPARIN SODIUM 40 MG/0.4ML ~~LOC~~ SOLN
40.0000 mg | SUBCUTANEOUS | Status: DC
  Administered 2015-09-07 – 2015-09-09 (×3): 40 mg via SUBCUTANEOUS
  Filled 2015-09-07 (×4): qty 0.4

## 2015-09-07 MED ORDER — AMLODIPINE BESYLATE 10 MG PO TABS
10.0000 mg | ORAL_TABLET | Freq: Every day | ORAL | Status: DC
  Administered 2015-09-08 – 2015-09-10 (×3): 10 mg via ORAL
  Filled 2015-09-07 (×3): qty 1

## 2015-09-07 MED ORDER — VANCOMYCIN HCL IN DEXTROSE 1-5 GM/200ML-% IV SOLN
1000.0000 mg | Freq: Once | INTRAVENOUS | Status: AC
  Administered 2015-09-07: 1000 mg via INTRAVENOUS
  Filled 2015-09-07: qty 200

## 2015-09-07 MED ORDER — VANCOMYCIN HCL IN DEXTROSE 1-5 GM/200ML-% IV SOLN: 1000.0000 mg | Freq: Once | INTRAVENOUS | Status: DC

## 2015-09-07 MED ORDER — THIAMINE HCL 100 MG/ML IJ SOLN
100.0000 mg | Freq: Every day | INTRAMUSCULAR | Status: DC
  Filled 2015-09-07 (×2): qty 1

## 2015-09-07 MED ORDER — OXYCODONE HCL 5 MG PO TABS
5.0000 mg | ORAL_TABLET | Freq: Four times a day (QID) | ORAL | Status: DC | PRN
  Administered 2015-09-08 – 2015-09-09 (×2): 5 mg via ORAL
  Filled 2015-09-07 (×2): qty 1

## 2015-09-07 MED ORDER — ONDANSETRON HCL 4 MG/2ML IJ SOLN: 4.0000 mg | Freq: Four times a day (QID) | INTRAMUSCULAR | Status: DC | PRN

## 2015-09-07 NOTE — ED Provider Notes (Signed)
INCISION AND DRAINAGE Performed by: Marella Chimes Consent: verbal consent obtained. Risks and benefits: risks, benefits and alternatives were discussed Type: abscess Body area: right anterior thigh Anesthesia: local infiltration Incision was made with a scalpel. Local anesthetic: lidocaine 1% without epinephrine Anesthetic total: 6 ml Complexity: complex Blunt dissection to break up loculations Drainage: purulent Drainage amount: scant Packing material: none Patient tolerance: Patient tolerated the procedure well with no immediate complications.     Marella Chimes, PA-C 09/08/15 0109  Quintella Reichert, MD 09/09/15 [phone]

## 2015-09-07 NOTE — Progress Notes (Signed)
Pharmacy Antibiotic Note  Jesse Pope is a 65 y.o. male admitted on 09/07/2015 with cellulitis.  Pharmacy has been consulted for vancomcyin dosing. Patient complains of R thigh soreness with erythema, edema, and tenderness. He has h/o previous MRSA infections of LE requiring I&Ds.  History of PCN allergy  Plan:  Vancomycin x 1 in ED then 1gm IV q12h  Monitor renal function and check trough if remains on vancomycin > 4 days     Temp (24hrs), Avg:97.9 F (36.6 C), Min:97.9 F (36.6 C), Max:97.9 F (36.6 C)   Recent Labs Lab 09/07/15 1514 09/07/15 1527 09/07/15 1929  WBC 8.5  --   --   CREATININE 0.92  --   --   LATICACIDVEN  --  2.26* 0.44*    CrCl cannot be calculated (Unknown ideal weight.).    Antimicrobials this admission: Vancomycin 2/22 >>   Dose adjustments this admission:  Microbiology results: 2/22 wound:   Thank you for allowing pharmacy to be a part of this patient's care.  Doreene Eland, PharmD, BCPS.   Pager: [phone] 09/07/2015 8:17 PM

## 2015-09-07 NOTE — Progress Notes (Signed)
VASCULAR LAB PRELIMINARY  PRELIMINARY  PRELIMINARY  PRELIMINARY  Right lower extremity venous duplex completed.    Preliminary report:  Right:  No evidence of DVT, superficial thrombosis, or Baker's cyst.    Prominent lymph nodes in bilateral groins.  Marialena Wollen, RVT 09/07/2015, 4:44 PM

## 2015-09-07 NOTE — ED Notes (Signed)
Pt c/o "sore" on anterior R thigh and redness, pain, swelling, and skin peeling on R foot x 6 months.  Pain score 3/10.  Pt reports that his PCP sent him to ED for IV antibiotics.  Hx of MRSA.

## 2015-09-07 NOTE — H&P (Signed)
[facility] History and Physical  FREDRICH HEISSER [id number] DOB: 1951/07/02 DOA: 09/07/2015  Referring physician: Quintella Reichert, M.D. PCP:  Melinda Crutch, MD   Chief Complaint: Right thigh sore  HPI: Jesse Pope is a 65 y.o. male with a past medical history of hypertension, chronic hyponatremia, tobacco abuse, alcohol abuse (drinks a six pack daily) who comes to the emergency department history of right thigh "sore" associated with surrounding erythema, edema, tenderness.   The patient has a history of previous MRSA infections in his lower extremities secondary to chronic lower extremity edema, skin peeling, pruritus and erythema. He has had at least 6 episodes of MRSA infections in the past, one of them requiring IV antibiotics in the hospital and multiple incision and drainage procedures. He denies fever, chills, night sweats or fatigue. He states that he went to see his PCP today who referred him to come to the emergency department.  In the emergency department, the patient underwent an I&D of his right thigh abscess by Dr. Ralene Bathe and we are admitting him for IV antibiotic therapy. Workup is significant for mild lactic acidosis, hyponatremia, hyperkalemia and abnormal chest radiograph with 2 nodular lesions for which radiates to the has recommended nonemergent CT scanning of the chest with contrast.   When seen, the patient was in no acute distress. He stated that his pain was minimal and declining any intervention for his pain management at this time.   Review of Systems:  Constitutional:  No weight loss, night sweats, Fevers, chills, fatigue.  HEENT:  No headaches, Difficulty swallowing,Tooth/dental problems,Sore throat,  No sneezing, itching, ear ache, nasal congestion, post nasal drip,  Cardio-vascular:  Positive for swelling in lower extremities. No chest pain, Orthopnea, PND,  anasarca, dizziness, palpitations  GI:  No heartburn, indigestion, abdominal pain,  nausea, vomiting, diarrhea, change in bowel habits, loss of appetite  Resp:  Denies dyspnea, productive cough, hemoptysis or wheezing.  Skin:  no rash or lesions.  GU:  Positive for nocturia. no dysuria, change in color of urine, no urgency or frequency. No flank pain.  Musculoskeletal:  No joint pain or swelling. No decreased range of motion. Frequent back pain.  Psych:  No change in mood or affect. No depression or anxiety. No memory loss.   Past Medical History  Diagnosis Date  . Basal cell carcinoma of right lower leg   . Hypertension   . MRSA (methicillin resistant Staphylococcus aureus)    Past Surgical History  Procedure Laterality Date  . Back surgery    . I&d extremity Left 02/10/2014    Procedure: IRRIGATION AND DEBRIDEMENT EXTREMITY/PRE PATELLA BURSA;  Surgeon: Marin Shutter, MD;  Location: [facility];  Service: Orthopedics;  Laterality: Left;  . I&d extremity Left 02/15/2014    Procedure: IRRIGATION AND DEBRIDEMENT EXTREMITY/DELAY CLOSURE OF LEFT KNEE;  Surgeon: Marin Shutter, MD;  Location: [facility];  Service: Orthopedics;  Laterality: Left;  . Biospy of right lower extremity  01/27/2014    SCC   Social History:  reports that he has been smoking.  He does not have any smokeless tobacco history on file. He reports that he drinks alcohol. He reports that he does not use illicit drugs.  Allergies  Allergen Reactions  . Penicillins Rash         Family History  Problem Relation Age of Onset  . Colon cancer Mother   . Crohn's disease Father     Prior to Admission medications   Medication Sig  Start Date End Date Taking? Authorizing Provider  amLODipine (NORVASC) 10 MG tablet Take 10 mg by mouth daily.   Yes Historical Provider, MD  naproxen sodium (ANAPROX) 220 MG tablet Take 220 mg by mouth every morning.   Yes Historical Provider, MD  ramipril (ALTACE) 10 MG capsule Take 10 mg by mouth daily.   Yes Historical Provider, MD   Physical Exam: Filed Vitals:   09/07/15  1712 09/07/15 1826 09/07/15 1830 09/07/15 1928  BP: 153/86 143/80  136/77  Pulse: 89 86 81 77  Temp:      TempSrc:      Resp: 15 19 19 13   SpO2: 98% 96% 98% 94%    Wt Readings from Last 3 Encounters:  01/12/15 68.992 kg (152 lb 1.6 oz)  06/15/14 65.318 kg (144 lb)  02/08/14 69.809 kg (153 lb 14.4 oz)    General:  Appears calm and comfortable Eyes: PERRL, normal lids, irises & conjunctiva ENT: grossly normal hearing, lips & tongue Neck: no LAD, masses or thyromegaly Cardiovascular: RRR, no m/r/g. 1+ LE edema. Telemetry: SR, no arrhythmias  Respiratory: CTA bilaterally, no w/r/r. Normal respiratory effort. Abdomen: soft, ntnd Skin: Positive peeling hyperkeratosis on the feet with associated edema and erythema. There are nodular skin changes on the lower third pretibial areas bilaterally, right >> left, Right thigh anterior area drained  abscess with dressing, with bloody discharge. Musculoskeletal: grossly normal tone BUE/BLE Psychiatric: grossly normal mood and affect, speech fluent and appropriate Neurologic: Awake, alert, oriented 3, grossly non-focal.          Labs on Admission:  Basic Metabolic Panel:  Recent Labs Lab 09/07/15 1514  NA 128*  K 5.3*  CL 95*  CO2 23  GLUCOSE 95  BUN 8  CREATININE 0.92  CALCIUM 9.0   Liver Function Tests:  Recent Labs Lab 09/07/15 1514  AST 26  ALT 19  ALKPHOS 63  BILITOT 1.2  PROT 8.1  ALBUMIN 4.4   CBC:  Recent Labs Lab 09/07/15 1514  WBC 8.5  HGB 13.9  HCT 40.3  MCV 93.3  PLT 287    BNP (last 3 results)  Recent Labs  09/07/15 1514  BNP 49.8    Radiological Exams on Admission: Dg Chest 2 View  09/07/2015  CLINICAL DATA:  Wound on the anterior aspect of the right lower leg with pain, redness and swelling. Wound on the right foot for 6 months. Initial encounter. EXAM: CHEST  2 VIEW COMPARISON:  None. FINDINGS: The chest is hyperexpanded with attenuation of the pulmonary vasculature. A 1.2 cm nodular  opacity is seen on the frontal view in the left mid lung zone. The punctate nodular opacities identified in the right upper lobe. The lungs are otherwise clear. No pneumothorax or pleural effusion. IMPRESSION: Two nodular opacities cannot be definitively characterized. Nonemergent chest CT with contrast is recommended further evaluation. No acute disease. The chest appears emphysematous. Electronically Signed   By: Inge Rise M.D.   On: 09/07/2015 17:09    EKG: Independently reviewed. Vent. rate 85 BPM PR interval 212 ms QRS duration 105 ms QT/QTc 398/473 ms P-R-T axes 72 59 71 Age not entered, assumed to be 65 years old for purpose of ECG interpretation Sinus rhythm Prolonged PR interval  Assessment/Plan Principal Problem:   Cellulitis and abscess of leg Admit to MedSurg. Continue gentle IV hydration. Continue IV vancomycin. Follow-up 1 culture and sensitivity. The patient was advised to follow-up with dermatology for skin biopsy.  Active Problems:   Hyponatremia  Check urine random sodium, potassium, protein, creatinine, protein creatinine ratio. This could be from chronic hemodilution secondary to her consumption. Per patient, this was evaluated in the past by his primary care provider.    HTN (hypertension) Continue amlodipine 10 mg by mouth daily. Continue ramipril 10 mg by mouth daily with close follow-up of potassium level. Monitor blood pressure.    Cigarette smoker Declined nicotine replacement therapy.    Alcohol abuse No previous history of DVTs, but drinks at least 6 beers daily. Start CiWA protocol. Thiamine, magnesium, folic acid and MVI supplementation.    Hyperkalemia Continue IV hydration.    Pulmonary nodules Check CT scan of the chest with contrast as recommended by radiology.    Lactic acidosis Result after I&D of abscess, IV fluids, IV antibiotics.    Nocturia Per patient, this was looked into by his PCP sometime ago. Check PSA in the  morning. Outpatient follow-up with urology if needed.    Code Status: Full code. DVT Prophylaxis: Lovenox SQ. Family Communication:  Disposition Plan: Admit to [facility] for IV antibiotic therapy and alcohol withdrawal monitoring.   Time spent: Over 70 minutes were spent in the process of this admission.   Reubin Milan, MD [facility] Pager [phone].

## 2015-09-07 NOTE — ED Provider Notes (Signed)
CSN: [id number]     Arrival date & time 09/07/15  1329 History   First MD Initiated Contact with Patient 09/07/15 1550     Chief Complaint  Patient presents with  . Foot Issue    . Wound Check     The history is provided by the patient. No language interpreter was used.   Jesse Pope is a 65 y.o. male who presents to the Emergency Department complaining of wound check.  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 Over the last week he's noticed a swelling to his right thigh with drainage over the last day and increased redness. He also has increased swelling and erythema to the distal leg as well. He saw his primary care provider today and recommend evaluation in the emergency department for IV antibiotics. He has a history of MRSA infection has needed IV antibiotics for a septic bursitis of the knee.  PCP at [facility]: Dr. Harrington Challenger.    Past Medical History  Diagnosis Date  . Basal cell carcinoma of right lower leg   . Hypertension   . MRSA (methicillin resistant Staphylococcus aureus)    Past Surgical History  Procedure Laterality Date  . Back surgery    . I&d extremity Left 02/10/2014    Procedure: IRRIGATION AND DEBRIDEMENT EXTREMITY/PRE PATELLA BURSA;  Surgeon: Marin Shutter, MD;  Location: [facility];  Service: Orthopedics;  Laterality: Left;  . I&d extremity Left 02/15/2014    Procedure: IRRIGATION AND DEBRIDEMENT EXTREMITY/DELAY CLOSURE OF LEFT KNEE;  Surgeon: Marin Shutter, MD;  Location: [facility];  Service: Orthopedics;  Laterality: Left;  . Biospy of right lower extremity  01/27/2014    SCC   Family History  Problem Relation Age of Onset  . Colon cancer Mother   . Crohn's disease Father    Social History  Substance Use Topics  . Smoking status: Current Every Day Smoker  . Smokeless tobacco: None  . Alcohol Use: 0.0 oz/week    0 Standard drinks or equivalent per week     Comment: Drinks Alcohol  daily    Review of Systems  All other systems reviewed and are negative.     Allergies  Penicillins  Home Medications   Prior to Admission medications   Medication Sig Start Date End Date Taking? Authorizing Provider  amLODipine (NORVASC) 10 MG tablet Take 10 mg by mouth daily.   Yes Historical Provider, MD  naproxen sodium (ANAPROX) 220 MG tablet Take 220 mg by mouth every morning.   Yes Historical Provider, MD  ramipril (ALTACE) 10 MG capsule Take 10 mg by mouth daily.   Yes Historical Provider, MD   BP 166/79 mmHg  Pulse 93  Temp(Src) 98.9 F (37.2 C) (Oral)  Resp 16  Ht 5\' 10"  (1.778 m)  Wt 160 lb (72.576 kg)  BMI 22.96 kg/m2  SpO2 98% Physical Exam  Constitutional: He is oriented to person, place, and time. He appears well-developed and well-nourished.  HENT:  Head: Normocephalic and atraumatic.  Cardiovascular: Normal rate and regular rhythm.   No murmur heard. Pulmonary/Chest: Effort normal. No respiratory distress.  Occasional end expiratory wheezes  Abdominal: Soft. There is no tenderness. There is no rebound and no guarding.  Musculoskeletal:  1-2+ pitting edema in the right lower extremity. 1+ pitting edema in the left lower extremity. There is a well-defined abscess to the right lateral thigh that is approximately 2 cm in  diameter with erythema extending 8-10 cm out from that area. Spontaneously draining. He also has significant skin changes to the right distal leg with nodular skin changes and deformity with peeling of the skin. There is erythema of the right foot. 2+ DP pulses bilaterally.  Neurological: He is alert and oriented to person, place, and time.  Skin: Skin is warm and dry.  Psychiatric: He has a normal mood and affect. His behavior is normal.  Nursing note and vitals reviewed.   ED Course  Procedures (including critical care time) Labs Review Labs Reviewed  COMPREHENSIVE METABOLIC PANEL - Abnormal; Notable for the following:    Sodium 128  (*)    Potassium 5.3 (*)    Chloride 95 (*)    All other components within normal limits  URINALYSIS, ROUTINE W REFLEX MICROSCOPIC (NOT AT [facility]) - Abnormal; Notable for the following:    Specific Gravity, Urine 1.004 (*)    All other components within normal limits  I-STAT CG4 LACTIC ACID, ED - Abnormal; Notable for the following:    Lactic Acid, Venous 2.26 (*)    All other components within normal limits  I-STAT CG4 LACTIC ACID, ED - Abnormal; Notable for the following:    Lactic Acid, Venous 0.44 (*)    All other components within normal limits  WOUND CULTURE  CBC  BRAIN NATRIURETIC PEPTIDE  MAGNESIUM  PHOSPHORUS  COMPREHENSIVE METABOLIC PANEL  CBC WITH DIFFERENTIAL/PLATELET  URINALYSIS, ROUTINE W REFLEX MICROSCOPIC (NOT AT [facility])  PSA  NA AND K (SODIUM & POTASSIUM), RAND UR  PROTEIN, URINE, RANDOM    Imaging Review Dg Chest 2 View  09/07/2015  CLINICAL DATA:  Wound on the anterior aspect of the right lower leg with pain, redness and swelling. Wound on the right foot for 6 months. Initial encounter. EXAM: CHEST  2 VIEW COMPARISON:  None. FINDINGS: The chest is hyperexpanded with attenuation of the pulmonary vasculature. A 1.2 cm nodular opacity is seen on the frontal view in the left mid lung zone. The punctate nodular opacities identified in the right upper lobe. The lungs are otherwise clear. No pneumothorax or pleural effusion. IMPRESSION: Two nodular opacities cannot be definitively characterized. Nonemergent chest CT with contrast is recommended further evaluation. No acute disease. The chest appears emphysematous. Electronically Signed   By: Inge Rise M.D.   On: 09/07/2015 17:09   I have personally reviewed and evaluated these images and lab results as part of my medical decision-making.   EKG Interpretation   Date/Time:  Wednesday September 07 2015 17:10:36 EST Ventricular Rate:  85 PR Interval:  212 QRS Duration: 105 QT Interval:  398 QTC Calculation: 473 R  Axis:   59 Text Interpretation:  Age not entered, assumed to be  65 years old for  purpose of ECG interpretation Sinus rhythm Prolonged PR interval Confirmed  by Hazle Coca [phone]) on 09/07/2015 5:19:33 PM      MDM   Final diagnoses:  Cellulitis of right lower extremity    Patient here for increased swelling and pain to the right leg. The right thigh has an abscess with surrounding cellulitis which was treated with I&D in the emergency department. The distal right lower extremity has cellulitis that will need IV antibiotics. Plan to admit to medicine for further treatment and given extent of cellulitis.  Quintella Reichert, MD 09/07/15 2350

## 2015-09-08 DIAGNOSIS — E871 Hypo-osmolality and hyponatremia: Secondary | ICD-10-CM

## 2015-09-08 DIAGNOSIS — N401 Enlarged prostate with lower urinary tract symptoms: Secondary | ICD-10-CM

## 2015-09-08 DIAGNOSIS — I10 Essential (primary) hypertension: Secondary | ICD-10-CM

## 2015-09-08 DIAGNOSIS — E872 Acidosis: Secondary | ICD-10-CM

## 2015-09-08 DIAGNOSIS — F1010 Alcohol abuse, uncomplicated: Secondary | ICD-10-CM

## 2015-09-08 DIAGNOSIS — Z85828 Personal history of other malignant neoplasm of skin: Secondary | ICD-10-CM

## 2015-09-08 DIAGNOSIS — R351 Nocturia: Secondary | ICD-10-CM

## 2015-09-08 DIAGNOSIS — Z720 Tobacco use: Secondary | ICD-10-CM

## 2015-09-08 DIAGNOSIS — L02415 Cutaneous abscess of right lower limb: Principal | ICD-10-CM

## 2015-09-08 DIAGNOSIS — B9689 Other specified bacterial agents as the cause of diseases classified elsewhere: Secondary | ICD-10-CM

## 2015-09-08 LAB — URINALYSIS, ROUTINE W REFLEX MICROSCOPIC
Bilirubin Urine: NEGATIVE
Glucose, UA: NEGATIVE mg/dL
Hgb urine dipstick: NEGATIVE
Ketones, ur: NEGATIVE mg/dL
Leukocytes, UA: NEGATIVE
Nitrite: NEGATIVE
Protein, ur: NEGATIVE mg/dL
Specific Gravity, Urine: 1.01 (ref 1.005–1.030)
pH: 7 (ref 5.0–8.0)

## 2015-09-08 LAB — COMPREHENSIVE METABOLIC PANEL
ALT: 16 U/L — ABNORMAL LOW (ref 17–63)
AST: 21 U/L (ref 15–41)
Albumin: 3.7 g/dL (ref 3.5–5.0)
Alkaline Phosphatase: 58 U/L (ref 38–126)
Anion gap: 8 (ref 5–15)
BUN: 8 mg/dL (ref 6–20)
CO2: 24 mmol/L (ref 22–32)
Calcium: 8.4 mg/dL — ABNORMAL LOW (ref 8.9–10.3)
Chloride: 99 mmol/L — ABNORMAL LOW (ref 101–111)
Creatinine, Ser: 0.92 mg/dL (ref 0.61–1.24)
GFR calc Af Amer: >60 mL/min (ref >60)
GFR calc non Af Amer: >60 mL/min (ref >60)
Glucose, Bld: 99 mg/dL (ref 65–99)
Potassium: 4.5 mmol/L (ref 3.5–5.1)
Sodium: 131 mmol/L — ABNORMAL LOW (ref 135–145)
Total Bilirubin: 1.9 mg/dL — ABNORMAL HIGH (ref 0.3–1.2)
Total Protein: 6.8 g/dL (ref 6.5–8.1)

## 2015-09-08 LAB — CBC WITH DIFFERENTIAL/PLATELET
Basophils Absolute: 0.1 10*3/uL (ref 0.0–0.1)
Basophils Relative: 1 %
Eosinophils Absolute: 0.3 10*3/uL (ref 0.0–0.7)
Eosinophils Relative: 3 %
HCT: 37.6 % — ABNORMAL LOW (ref 39.0–52.0)
Hemoglobin: 12.8 g/dL — ABNORMAL LOW (ref 13.0–17.0)
Lymphocytes Relative: 10 %
Lymphs Abs: 0.9 10*3/uL (ref 0.7–4.0)
MCH: 31.7 pg (ref 26.0–34.0)
MCHC: 34 g/dL (ref 30.0–36.0)
MCV: 93.1 fL (ref 78.0–100.0)
Monocytes Absolute: 1 10*3/uL (ref 0.1–1.0)
Monocytes Relative: 13 %
Neutro Abs: 6 10*3/uL (ref 1.7–7.7)
Neutrophils Relative %: 73 %
Platelets: 273 10*3/uL (ref 150–400)
RBC: 4.04 MIL/uL — ABNORMAL LOW (ref 4.22–5.81)
RDW: 13.3 % (ref 11.5–15.5)
WBC: 8.3 10*3/uL (ref 4.0–10.5)

## 2015-09-08 LAB — NA AND K (SODIUM & POTASSIUM), RAND UR
Potassium Urine: 10 mmol/L
Sodium, Ur: 85 mmol/L

## 2015-09-08 LAB — PROTEIN, URINE, RANDOM: Total Protein, Urine: <6 mg/dL

## 2015-09-08 LAB — PSA: PSA: 4.71 ng/mL — ABNORMAL HIGH (ref 0.00–4.00)

## 2015-09-08 NOTE — Care Management Important Message (Signed)
Important Message  Patient Details  Name: Jesse Pope MRN: [id number] Date of Birth: 1951-06-06   Medicare Important Message Given:  Yes    Camillo Flaming 09/08/2015, 3:10 PMImportant Message  Patient Details  Name: Jesse Pope MRN: [id number] Date of Birth: 03-Nov-1950   Medicare Important Message Given:  Yes    Camillo Flaming 09/08/2015, 3:10 PM

## 2015-09-08 NOTE — Progress Notes (Signed)
Progress Note   Jesse Pope [id number] DOB: January 01, 1951 DOA: 09/07/2015 PCP:  Melinda Crutch, MD   Brief Narrative:   Jesse Pope is an 65 y.o. male with a past medical history of hypertension, chronic hyponatremia, tobacco abuse, alcohol abuse (drinks a six pack daily), prior MRSA infections who was admitted 09/07/15 with a right thigh "sore" associated with surrounding erythema, edema, tenderness. In the emergency department, the patient underwent an I&D of his right thigh abscess by Dr. Ralene Bathe and we are admitting him for IV antibiotic therapy. Workup was significant for mild lactic acidosis, hyponatremia, hyperkalemia and abnormal chest radiograph with 2 nodular lesions for which radiology has recommended nonemergent CT scanning of the chest with contrast.   Assessment/Plan:   Principal Problem:  Cellulitis and abscess of leg/likely recurrent MRSA - Status post I & D in the ER. Continue gentle IV hydration. - Continue IV vancomycin. - Follow-up wound culture and sensitivity. - The patient was advised to follow-up with dermatology for skin biopsy. He tells me he has a history of cancer, and squamous cell carcinoma of the right leg is noted in his problem list. - We'll ask ID to evaluate given his recurrent MRSA infections.  Active Problems:  Hyponatremia - Improving with IVF.  HTN (hypertension) - Continue amlodipine 10 mg by mouth daily. - Continue ramipril 10 mg by mouth daily with close follow-up of potassium level. - Monitor blood pressure.   Cigarette smoker - Declined nicotine replacement therapy.   Alcohol abuse -No previous history of DTs, but drinks at least 6 beers daily. - Continue Ativan detox per CIWA protocol. - Thiamine, magnesium, folic acid and MVI supplementation.   Hyperkalemia, resolved - Continue IV hydration.   Pulmonary nodules - Status post CT scan of the chest with contrast, nodules should be followed up in 6 months.    Lactic acidosis - Result after I&D of abscess, IV fluids, IV antibiotics.   Nocturia - PSA mildly elevated. - Outpatient follow-up with urology recommended.    DVT Prophylaxis - Lovenox ordered.   Family Communication/Anticipated D/C date and plan/Code Status   Family Communication: No family at the bedside. Disposition Plan: Home after ID weighs in on current treatment recommendations. Anticipated D/C date:   09/09/15 if okay with ID. Code Status: Full code.  IV Access:    Peripheral IV   Procedures and diagnostic studies:   Dg Chest 2 View  09/07/2015  CLINICAL DATA:  Wound on the anterior aspect of the right lower leg with pain, redness and swelling. Wound on the right foot for 6 months. Initial encounter. EXAM: CHEST  2 VIEW COMPARISON:  None. FINDINGS: The chest is hyperexpanded with attenuation of the pulmonary vasculature. A 1.2 cm nodular opacity is seen on the frontal view in the left mid lung zone. The punctate nodular opacities identified in the right upper lobe. The lungs are otherwise clear. No pneumothorax or pleural effusion. IMPRESSION: Two nodular opacities cannot be definitively characterized. Nonemergent chest CT with contrast is recommended further evaluation. No acute disease. The chest appears emphysematous. Electronically Signed   By: Inge Rise M.D.   On: 09/07/2015 17:09   Ct Chest W Contrast  09/08/2015  CLINICAL DATA:  Pulmonary nodules. EXAM: CT CHEST WITH CONTRAST TECHNIQUE: Multidetector CT imaging of the chest was performed during intravenous contrast administration. CONTRAST:  79mL OMNIPAQUE IOHEXOL 300 MG/ML  SOLN COMPARISON:  None. FINDINGS: THORACIC INLET/BODY WALL: No acute abnormality. MEDIASTINUM: Normal heart size. No  pericardial effusion. Atherosclerotic calcification, including in the LAD. No acute vascular abnormality. No adenopathy. LUNG WINDOWS: There are small scattered solid pulmonary nodules. The most notable: 4 mm 5:7 -right upper  lobe 4 mm 5:17 -right upper lobe 5 mm 5:20 -right upper lobe. There are also sub solid/reticular densities which could reflect scarring or nodule. These are located in the right lower lobe on images 24 and 26 and in the left lower lobe on image 31. The largest is in the left lower lobe measuring 21 mm. No solid components are noted. The ground-glass nodules can be followed on the solid nodule algorithm initially. Hyperinflated lungs which may reflect COPD in this patient with smoking history. No emphysematous changes are seen. There is no edema, consolidation, effusion, or pneumothorax. UPPER ABDOMEN: No acute findings. OSSEOUS: No acute fracture.  No suspicious lytic or blastic lesions. IMPRESSION: Bilateral solid and sub solid pulmonary nodules. The largest solid nodule measures 5 mm. Given risk factors for bronchogenic carcinoma, follow-up chest CT at 6 months is recommended. This recommendation follows the consensus statement: Guidelines for Management of Small Pulmonary Nodules Detected on CT Scans: A Statement from the [place] as published in Radiology 2005;237:395-400. Electronically Signed   By: Monte Fantasia M.D.   On: 09/08/2015 07:13     Medical Consultants:    Dr. Michel Bickers, Infectious Disease  Anti-Infectives:    Vancomycin 09/07/15--->  Subjective:    Jesse Pope tells me he has had cancer of his right lower leg, and has had recurrent MRSA infections despite treatment with surgical scrub/dial soap baths, multiple rounds of doxycycline, and an unspecified treatment course at the ID clinic. He can't remember who treated him or the exact details of the treatment.  Objective:    Filed Vitals:   09/07/15 1928 09/07/15 2016 09/07/15 2044 09/08/15 0423  BP: 136/77  166/79 127/75  Pulse: 77  93 66  Temp:   98.9 F (37.2 C) 98.2 F (36.8 C)  TempSrc:   Oral Oral  Resp: 13  16 16   Height:  5\' 10"  (1.778 m)    Weight:  72.576 kg (160 lb)    SpO2: 94%  98% 99%     Intake/Output Summary (Last 24 hours) at 09/08/15 1029 Last data filed at 09/08/15 0500  Gross per 24 hour  Intake 1052.5 ml  Output    325 ml  Net  727.5 ml   Filed Weights   09/07/15 2016  Weight: 72.576 kg (160 lb)    Exam: Gen:  NAD Cardiovascular:  RRR, No M/R/G Respiratory:  Lungs CTAB Gastrointestinal:  Abdomen soft, NT/ND, + BS Extremities:  Pustule with a large area of surrounding erythema to the right thigh, nodules to the right lower anterior leg concerning for recurrent squamous cell carcinoma   Data Reviewed:    Labs: Basic Metabolic Panel:  Recent Labs Lab 09/07/15 1514 09/07/15 1920 09/08/15 0414  NA 128*  --  131*  K 5.3*  --  4.5  CL 95*  --  99*  CO2 23  --  24  GLUCOSE 95  --  99  BUN 8  --  8  CREATININE 0.92  --  0.92  CALCIUM 9.0  --  8.4*  MG  --  2.1  --   PHOS  --  3.7  --    GFR Estimated Creatinine Clearance: 82.2 mL/min (by C-G formula based on Cr of 0.92). Liver Function Tests:  Recent Labs Lab 09/07/15 1514 09/08/15  0414  AST 26 21  ALT 19 16*  ALKPHOS 63 58  BILITOT 1.2 1.9*  PROT 8.1 6.8  ALBUMIN 4.4 3.7   CBC:  Recent Labs Lab 09/07/15 1514 09/08/15 0414  WBC 8.5 8.3  NEUTROABS  --  6.0  HGB 13.9 12.8*  HCT 40.3 37.6*  MCV 93.3 93.1  PLT 287 273   Sepsis Labs:  Recent Labs Lab 09/07/15 1514 09/07/15 1527 09/07/15 1929 09/08/15 0414  WBC 8.5  --   --  8.3  LATICACIDVEN  --  2.26* 0.44*  --    Microbiology No results found for this or any previous visit (from the past 240 hour(s)).   Medications:   . amLODipine  10 mg Oral Daily  . enoxaparin (LOVENOX) injection  40 mg Subcutaneous Q24H  . folic acid  1 mg Oral Daily  . lidocaine  20 mL Intradermal Once  . LORazepam  0-4 mg Oral 4 times per day   Followed by  . [START ON 09/10/2015] LORazepam  0-4 mg Oral Q12H  . multivitamin with minerals  1 tablet Oral Daily  . pantoprazole  40 mg Oral Daily  . ramipril  10 mg Oral Daily  . thiamine   100 mg Oral Daily  . vancomycin  1,000 mg Intravenous Q12H   Continuous Infusions: . sodium chloride 75 mL/hr at 09/07/15 2147    Time spent: 35 minutes with > 50% of time discussing current diagnostic test results, clinical impression and plan of care.   LOS: 1 day   RAMA,CHRISTINA  [facility] Pager [phone]. If unable to reach me by pager, please call my cell phone at [phone].  *Please refer to [email or web address], password [email or web address] to get updated schedule on who will round on this patient, as hospitalists switch teams weekly. If 7PM-7AM, please contact night-coverage at [email or web address], password [email or web address] for any overnight needs.  09/08/2015, 10:29 AM

## 2015-09-08 NOTE — Consult Note (Signed)
[facility] for Infectious Disease    Date of Admission:  09/07/2015           Day 2 vancomycin       Reason for Consult: Recurrent skin and soft tissue infections with active right thigh abscess    Referring Physician: Dr. Gerald Stabs Rama Primary Care Physician: Dr. Earle Gell  Principal Problem:   Cellulitis and abscess of leg Active Problems:   Hyponatremia   HTN (hypertension)   Cigarette smoker   Alcohol abuse   Hyperkalemia   Lactic acidosis   Nocturia associated with benign prostatic hypertrophy   . amLODipine  10 mg Oral Daily  . enoxaparin (LOVENOX) injection  40 mg Subcutaneous Q24H  . folic acid  1 mg Oral Daily  . lidocaine  20 mL Intradermal Once  . LORazepam  0-4 mg Oral 4 times per day   Followed by  . [START ON 09/10/2015] LORazepam  0-4 mg Oral Q12H  . multivitamin with minerals  1 tablet Oral Daily  . pantoprazole  40 mg Oral Daily  . ramipril  10 mg Oral Daily  . thiamine  100 mg Oral Daily  . vancomycin  1,000 mg Intravenous Q12H    Recommendations: 1. Continue vancomycin pending final culture results   Assessment: He has recurrent skin and soft tissue infections. I suspect that this no right thigh abscess is also due to MRSA. I will continue vancomycin and check cultures tomorrow. He should be able to transition back to oral antibiotic therapy soon.    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 He has been treated by Dr. Lestine Box [facility] dermatology. It sounds like he has been referred for possible radiation therapy. He is also bothered by very dry skin. He has had problems with recurrent skin and soft tissue infections. I saw him in the summer of 2015 when he had MRSA prepatellar bursitis of his left knee. He was treated with IV vancomycin and then given one dose of long-acting oritavancin  prior to discharge. He was seen in follow-up in our clinic by Dr. Carlyle Basques in December 2015. Her note indicates that his infection had resolved and that he required no further treatment or follow-up at that time. He has continued to have recurrent boils. He has been treated with doxycycline on multiple occasions. He has tried using intranasal mupirocin and chlorhexidine bathing but has not felt like that helped decrease the frequency or severity of his infections. He recently developed a new enlarging boil on his right thigh and was referred to the emergency department. He underwent incision and drainage yesterday. He is feeling better on vancomycin. He is eager to go home. His cultures are pending.   Review of Systems: Review of Systems  Constitutional: Negative for fever, chills, weight loss, malaise/fatigue and diaphoresis.  HENT: Negative for sore throat.   Respiratory: Negative for cough, sputum production and shortness of breath.   Cardiovascular: Negative for chest pain.  Gastrointestinal: Negative for nausea, vomiting and diarrhea.  Musculoskeletal: Negative for myalgias and joint pain.  Skin: Negative for rash.       As noted in history of present illness.  Neurological: Negative for headaches.    Past Medical History  Diagnosis Date  . Basal cell carcinoma of right lower  leg   . Hypertension   . MRSA (methicillin resistant Staphylococcus aureus)     Social History  Substance Use Topics  . Smoking status: Current Every Day Smoker  . Smokeless tobacco: None  . Alcohol Use: 0.0 oz/week    0 Standard drinks or equivalent per week     Comment: Drinks Alcohol daily    Family History  Problem Relation Age of Onset  . Colon cancer Mother   . Crohn's disease Father     OBJECTIVE: Blood pressure 117/66, pulse 73, temperature 99.1 F (37.3 C), temperature source Oral, resp. rate 16, height 5\' 10"  (1.778 m), weight 160 lb (72.576 kg), SpO2 98 %.  Physical Exam    Constitutional: He is oriented to person, place, and time.  He is sitting up in a chair. He is in good spirits.  HENT:  Mouth/Throat: No oropharyngeal exudate.  Eyes: Conjunctivae are normal.  Cardiovascular: Normal rate and regular rhythm.   No murmur heard. Pulmonary/Chest: Breath sounds normal.  Abdominal: Soft. There is no tenderness.  Musculoskeletal:  The tip of his right index finger has been amputated.  Neurological: He is alert and oriented to person, place, and time.  Skin:  He has a silver dollar sized nodule on his right anterior thigh. There is a small central incision. Surrounding skin is darkened.  He has very dry skin especially on the soles of his feet. He has a large irregular lesion on his right anterior shin from just below his knee to his foot.  Psychiatric: Mood and affect normal.    Lab Results Lab Results  Component Value Date   WBC 8.3 09/08/2015   HGB 12.8* 09/08/2015   HCT 37.6* 09/08/2015   MCV 93.1 09/08/2015   PLT 273 09/08/2015    Lab Results  Component Value Date   CREATININE 0.92 09/08/2015   BUN 8 09/08/2015   NA 131* 09/08/2015   K 4.5 09/08/2015   CL 99* 09/08/2015   CO2 24 09/08/2015    Lab Results  Component Value Date   ALT 16* 09/08/2015   AST 21 09/08/2015   ALKPHOS 58 09/08/2015   BILITOT 1.9* 09/08/2015     Microbiology: Recent Results (from the past 240 hour(s))  Wound culture     Status: None (Preliminary result)   Collection Time: 09/07/15  4:14 PM  Result Value Ref Range Status   Specimen Description ABSCESS RIGHT LEG  Final   Special Requests Normal  Final   Gram Stain   Final    NO WBC SEEN NO SQUAMOUS EPITHELIAL CELLS SEEN NO ORGANISMS SEEN Performed at [facility]    Culture PENDING  Incomplete   Report Status PENDING  Incomplete    Michel Bickers, MD [place] for Infect[facility] [phone] pager   [phone] cell 09/08/2015, 4:48 PM

## 2015-09-09 DIAGNOSIS — B9561 Methicillin susceptible Staphylococcus aureus infection as the cause of diseases classified elsewhere: Secondary | ICD-10-CM

## 2015-09-09 LAB — BASIC METABOLIC PANEL
Anion gap: 8 (ref 5–15)
BUN: 9 mg/dL (ref 6–20)
CO2: 20 mmol/L — ABNORMAL LOW (ref 22–32)
Calcium: 8.2 mg/dL — ABNORMAL LOW (ref 8.9–10.3)
Chloride: 103 mmol/L (ref 101–111)
Creatinine, Ser: 1.02 mg/dL (ref 0.61–1.24)
GFR calc Af Amer: >60 mL/min (ref >60)
GFR calc non Af Amer: >60 mL/min (ref >60)
Glucose, Bld: 90 mg/dL (ref 65–99)
Potassium: 4.2 mmol/L (ref 3.5–5.1)
Sodium: 131 mmol/L — ABNORMAL LOW (ref 135–145)

## 2015-09-09 LAB — CBC
HCT: 35.7 % — ABNORMAL LOW (ref 39.0–52.0)
Hemoglobin: 12.3 g/dL — ABNORMAL LOW (ref 13.0–17.0)
MCH: 32.2 pg (ref 26.0–34.0)
MCHC: 34.5 g/dL (ref 30.0–36.0)
MCV: 93.5 fL (ref 78.0–100.0)
Platelets: 247 10*3/uL (ref 150–400)
RBC: 3.82 MIL/uL — ABNORMAL LOW (ref 4.22–5.81)
RDW: 13.3 % (ref 11.5–15.5)
WBC: 5.5 10*3/uL (ref 4.0–10.5)

## 2015-09-09 NOTE — Progress Notes (Signed)
Patient ID: Jesse Pope, male   DOB: [birth year], 65 y.o.   MRN: [id number]         [facility] for Infectious Disease    Date of Admission:  09/07/2015           Day 3 vancomycin  Principal Problem:   Cellulitis and abscess of leg Active Problems:   Hyponatremia   HTN (hypertension)   Cigarette smoker   Alcohol abuse   Hyperkalemia   Lactic acidosis   Nocturia associated with benign prostatic hypertrophy   . amLODipine  10 mg Oral Daily  . enoxaparin (LOVENOX) injection  40 mg Subcutaneous Q24H  . folic acid  1 mg Oral Daily  . lidocaine  20 mL Intradermal Once  . LORazepam  0-4 mg Oral 4 times per day   Followed by  . [START ON 09/10/2015] LORazepam  0-4 mg Oral Q12H  . multivitamin with minerals  1 tablet Oral Daily  . pantoprazole  40 mg Oral Daily  . ramipril  10 mg Oral Daily  . thiamine  100 mg Oral Daily  . vancomycin  1,000 mg Intravenous Q12H    SUBJECTIVE: He is feeling much better and eager to go home.  Review of Systems: Review of Systems  Constitutional: Negative for fever, chills and diaphoresis.  Musculoskeletal: Negative for joint pain.    Past Medical History  Diagnosis Date  . Basal cell carcinoma of right lower leg   . Hypertension   . MRSA (methicillin resistant Staphylococcus aureus)     Social History  Substance Use Topics  . Smoking status: Current Every Day Smoker  . Smokeless tobacco: None  . Alcohol Use: 0.0 oz/week    0 Standard drinks or equivalent per week     Comment: Drinks Alcohol daily    Family History  Problem Relation Age of Onset  . Colon cancer Mother   . Crohn's disease Father     OBJECTIVE: Filed Vitals:   09/09/15 0500 09/09/15 0929 09/09/15 1200 09/09/15 1400  BP: 135/78 153/82 134/69 134/69  Pulse: 69 66 69 71  Temp: 97.3 F (36.3 C) 97.9 F (36.6 C)    TempSrc: Oral Oral    Resp: 16 16  18   Height:      Weight:      SpO2: 97% 100%  100%   Body mass index is 22.96 kg/(m^2).  Physical  Exam  Constitutional:  He is alert and in no distress. He is watching television.  Musculoskeletal:  The abscess on his right anterior thigh is improving. It is decreasing in size. The surrounding cellulitis is resolving. It is only minimally tender to palpation.    Lab Results Lab Results  Component Value Date   WBC 5.5 09/09/2015   HGB 12.3* 09/09/2015   HCT 35.7* 09/09/2015   MCV 93.5 09/09/2015   PLT 247 09/09/2015    Lab Results  Component Value Date   CREATININE 1.02 09/09/2015   BUN 9 09/09/2015   NA 131* 09/09/2015   K 4.2 09/09/2015   CL 103 09/09/2015   CO2 20* 09/09/2015    Lab Results  Component Value Date   ALT 16* 09/08/2015   AST 21 09/08/2015   ALKPHOS 58 09/08/2015   BILITOT 1.9* 09/08/2015     Microbiology: Recent Results (from the past 240 hour(s))  Wound culture     Status: None (Preliminary result)   Collection Time: 09/07/15  4:14 PM  Result Value Ref Range Status  Specimen Description ABSCESS RIGHT LEG  Final   Special Requests Normal  Final   Gram Stain   Final    NO WBC SEEN NO SQUAMOUS EPITHELIAL CELLS SEEN NO ORGANISMS SEEN Performed at [facility]    Culture   Final    FEW STAPHYLOCOCCUS AUREUS Note: RIFAMPIN AND GENTAMICIN SHOULD NOT BE USED AS SINGLE DRUGS FOR TREATMENT OF STAPH INFECTIONS. Performed at [facility]    Report Status PENDING  Incomplete     ASSESSMENT: He has recurrent staph aureus abscess. Antibiotic sensitivities are pending but this is almost certainly MRSA again. He is clinically stable and ready for discharge. I am comfortable sending him home tonight on oral doxycycline. I can check final susceptibilities this weekend and call him if we need to switch to an alternative agent such as trimethoprim sulfamethoxazole.  PLAN: 1. Recommend discharge home tonight on doxycycline 100 mg twice daily for 5 more days 2. I will monitor final antibiotic susceptibilities and call him if we need to switch  to an alternative antibiotic  Michel Bickers, MD [facility] for [place] [phone] pager   [phone] cell 09/09/2015, 4:34 PM

## 2015-09-09 NOTE — Progress Notes (Signed)
Progress Note   Jesse Pope [id number] DOB: [birth year] DOA: 09/07/2015 PCP:  Melinda Crutch, MD   Brief Narrative:   Jesse Pope is an 65 y.o. male with a past medical history of hypertension, chronic hyponatremia, tobacco abuse, alcohol abuse (drinks a six pack daily), prior MRSA infections who was admitted 09/07/15 with a right thigh "sore" associated with surrounding erythema, edema, tenderness. In the emergency department, the patient underwent an I&D of his right thigh abscess by Dr. Ralene Bathe and we are admitting him for IV antibiotic therapy. Workup was significant for mild lactic acidosis, hyponatremia, hyperkalemia and abnormal chest radiograph with 2 nodular lesions for which radiology has recommended nonemergent CT scanning of the chest with contrast.   Assessment/Plan:   Principal Problem:  Cellulitis and abscess of leg/likely recurrent MRSA - Status post I & D in the ER. Continue gentle IV hydration. - Continue IV vancomycin. - Wound cultures growing staph aureus, likely MRSA, await sensitivities. - The patient was advised to follow-up with dermatology for skin biopsy. He tells me he has a history of cancer, and squamous cell carcinoma of the right leg is noted in his problem list. - ID following for recommendations.  Active Problems:  Hyponatremia - Improving with IVF.  HTN (hypertension) - Continue amlodipine 10 mg by mouth daily. - Continue ramipril 10 mg by mouth daily with close follow-up of potassium level. - Monitor blood pressure.   Cigarette smoker - Declined nicotine replacement therapy.   Alcohol abuse - No previous history of DTs, but drinks at least 6 beers daily. - Continue Ativan detox per CIWA protocol. - Thiamine, magnesium, folic acid and MVI supplementation.   Hyperkalemia, resolved - Continue IV hydration.   Pulmonary nodules - Status post CT scan of the chest with contrast, nodules should be followed up in 6 months.    Lactic acidosis - Result after I&D of abscess, IV fluids, IV antibiotics.   Nocturia - PSA mildly elevated. - Outpatient follow-up with urology recommended.    DVT Prophylaxis - Lovenox ordered.   Family Communication/Anticipated D/C date and plan/Code Status   Family Communication: No family at the bedside. Disposition Plan: Home. Anticipated D/C date:   09/10/15 if culture finalized. Code Status: Full code.  IV Access:    Peripheral IV   Procedures and diagnostic studies:   Dg Chest 2 View  09/07/2015  CLINICAL DATA:  Wound on the anterior aspect of the right lower leg with pain, redness and swelling. Wound on the right foot for 6 months. Initial encounter. EXAM: CHEST  2 VIEW COMPARISON:  None. FINDINGS: The chest is hyperexpanded with attenuation of the pulmonary vasculature. A 1.2 cm nodular opacity is seen on the frontal view in the left mid lung zone. The punctate nodular opacities identified in the right upper lobe. The lungs are otherwise clear. No pneumothorax or pleural effusion. IMPRESSION: Two nodular opacities cannot be definitively characterized. Nonemergent chest CT with contrast is recommended further evaluation. No acute disease. The chest appears emphysematous. Electronically Signed   By: Inge Rise M.D.   On: 09/07/2015 17:09   Ct Chest W Contrast  09/08/2015  CLINICAL DATA:  Pulmonary nodules. EXAM: CT CHEST WITH CONTRAST TECHNIQUE: Multidetector CT imaging of the chest was performed during intravenous contrast administration. CONTRAST:  29mL OMNIPAQUE IOHEXOL 300 MG/ML  SOLN COMPARISON:  None. FINDINGS: THORACIC INLET/BODY WALL: No acute abnormality. MEDIASTINUM: Normal heart size. No pericardial effusion. Atherosclerotic calcification, including in the LAD. No acute  vascular abnormality. No adenopathy. LUNG WINDOWS: There are small scattered solid pulmonary nodules. The most notable: 4 mm 5:7 -right upper lobe 4 mm 5:17 -right upper lobe 5 mm 5:20 -right  upper lobe. There are also sub solid/reticular densities which could reflect scarring or nodule. These are located in the right lower lobe on images 24 and 26 and in the left lower lobe on image 31. The largest is in the left lower lobe measuring 21 mm. No solid components are noted. The ground-glass nodules can be followed on the solid nodule algorithm initially. Hyperinflated lungs which may reflect COPD in this patient with smoking history. No emphysematous changes are seen. There is no edema, consolidation, effusion, or pneumothorax. UPPER ABDOMEN: No acute findings. OSSEOUS: No acute fracture.  No suspicious lytic or blastic lesions. IMPRESSION: Bilateral solid and sub solid pulmonary nodules. The largest solid nodule measures 5 mm. Given risk factors for bronchogenic carcinoma, follow-up chest CT at 6 months is recommended. This recommendation follows the consensus statement: Guidelines for Management of Small Pulmonary Nodules Detected on CT Scans: A Statement from the [place] as published in Radiology 2005;237:395-400. Electronically Signed   By: Monte Fantasia M.D.   On: 09/08/2015 07:13     Medical Consultants:    Dr. Michel Bickers, Infectious Disease  Anti-Infectives:    Vancomycin 09/07/15--->  Subjective:   Jesse Pope says his RLE is still swollen, and that his LLE is now swollen.  No N/V/D.  Appetite good.    Objective:    Filed Vitals:   09/09/15 0500 09/09/15 0929 09/09/15 1200 09/09/15 1400  BP: 135/78 153/82 134/69 134/69  Pulse: 69 66 69 71  Temp: 97.3 F (36.3 C) 97.9 F (36.6 C)    TempSrc: Oral Oral    Resp: 16 16  18   Height:      Weight:      SpO2: 97% 100%  100%    Intake/Output Summary (Last 24 hours) at 09/09/15 1450 Last data filed at 09/09/15 1100  Gross per 24 hour  Intake 3438.75 ml  Output    200 ml  Net 3238.75 ml   Filed Weights   09/07/15 2016  Weight: 72.576 kg (160 lb)    Exam: Gen:  NAD Cardiovascular:  RRR, No  M/R/G Respiratory:  Lungs CTAB Gastrointestinal:  Abdomen soft, NT/ND, + BS Extremities:  RLE> LLE edema, no change in nodular lesions anterior RLE, abscess right thigh less erythematous   Data Reviewed:    Labs: Basic Metabolic Panel:  Recent Labs Lab 09/07/15 1514 09/07/15 1920 09/08/15 0414 09/09/15 0418  NA 128*  --  131* 131*  K 5.3*  --  4.5 4.2  CL 95*  --  99* 103  CO2 23  --  24 20*  GLUCOSE 95  --  99 90  BUN 8  --  8 9  CREATININE 0.92  --  0.92 1.02  CALCIUM 9.0  --  8.4* 8.2*  MG  --  2.1  --   --   PHOS  --  3.7  --   --    GFR Estimated Creatinine Clearance: 74.1 mL/min (by C-G formula based on Cr of 1.02). Liver Function Tests:  Recent Labs Lab 09/07/15 1514 09/08/15 0414  AST 26 21  ALT 19 16*  ALKPHOS 63 58  BILITOT 1.2 1.9*  PROT 8.1 6.8  ALBUMIN 4.4 3.7   CBC:  Recent Labs Lab 09/07/15 1514 09/08/15 0414 09/09/15 0418  WBC 8.5  8.3 5.5  NEUTROABS  --  6.0  --   HGB 13.9 12.8* 12.3*  HCT 40.3 37.6* 35.7*  MCV 93.3 93.1 93.5  PLT 287 273 247   Sepsis Labs:  Recent Labs Lab 09/07/15 1514 09/07/15 1527 09/07/15 1929 09/08/15 0414 09/09/15 0418  WBC 8.5  --   --  8.3 5.5  LATICACIDVEN  --  2.26* 0.44*  --   --    Microbiology Recent Results (from the past 240 hour(s))  Wound culture     Status: None (Preliminary result)   Collection Time: 09/07/15  4:14 PM  Result Value Ref Range Status   Specimen Description ABSCESS RIGHT LEG  Final   Special Requests Normal  Final   Gram Stain   Final    NO WBC SEEN NO SQUAMOUS EPITHELIAL CELLS SEEN NO ORGANISMS SEEN Performed at [facility]    Culture   Final    FEW STAPHYLOCOCCUS AUREUS Note: RIFAMPIN AND GENTAMICIN SHOULD NOT BE USED AS SINGLE DRUGS FOR TREATMENT OF STAPH INFECTIONS. Performed at [facility]    Report Status PENDING  Incomplete     Medications:   . amLODipine  10 mg Oral Daily  . enoxaparin (LOVENOX) injection  40 mg Subcutaneous Q24H    . folic acid  1 mg Oral Daily  . lidocaine  20 mL Intradermal Once  . LORazepam  0-4 mg Oral 4 times per day   Followed by  . [START ON 09/10/2015] LORazepam  0-4 mg Oral Q12H  . multivitamin with minerals  1 tablet Oral Daily  . pantoprazole  40 mg Oral Daily  . ramipril  10 mg Oral Daily  . thiamine  100 mg Oral Daily  . vancomycin  1,000 mg Intravenous Q12H   Continuous Infusions: . sodium chloride Stopped (09/09/15 1100)    Time spent: 25 minutes.  LOS: 2 days   Jesse Pope,Jesse Pope  [facility] Pager [phone]. If unable to reach me by pager, please call my cell phone at [phone].  *Please refer to [email or web address], password [email or web address] to get updated schedule on who will round on this patient, as hospitalists switch teams weekly. If 7PM-7AM, please contact night-coverage at [email or web address], password [email or web address] for any overnight needs.  09/09/2015, 2:50 PM

## 2015-09-10 ENCOUNTER — Encounter (HOSPITAL_COMMUNITY): Payer: Self-pay | Admitting: Internal Medicine

## 2015-09-10 DIAGNOSIS — R918 Other nonspecific abnormal finding of lung field: Secondary | ICD-10-CM | POA: Diagnosis present

## 2015-09-10 DIAGNOSIS — E875 Hyperkalemia: Secondary | ICD-10-CM

## 2015-09-10 HISTORY — DX: Other nonspecific abnormal finding of lung field: R91.8

## 2015-09-10 LAB — BASIC METABOLIC PANEL
Anion gap: 9 (ref 5–15)
BUN: 6 mg/dL (ref 6–20)
CO2: 23 mmol/L (ref 22–32)
Calcium: 8.5 mg/dL — ABNORMAL LOW (ref 8.9–10.3)
Chloride: 98 mmol/L — ABNORMAL LOW (ref 101–111)
Creatinine, Ser: 1 mg/dL (ref 0.61–1.24)
GFR calc Af Amer: >60 mL/min (ref >60)
GFR calc non Af Amer: >60 mL/min (ref >60)
Glucose, Bld: 85 mg/dL (ref 65–99)
Potassium: 3.9 mmol/L (ref 3.5–5.1)
Sodium: 130 mmol/L — ABNORMAL LOW (ref 135–145)

## 2015-09-10 LAB — WOUND CULTURE
Gram Stain: NONE SEEN
Special Requests: NORMAL

## 2015-09-10 MED ORDER — DOXYCYCLINE HYCLATE 100 MG PO TABS
100.0000 mg | ORAL_TABLET | Freq: Two times a day (BID) | ORAL | Status: DC
  Administered 2015-09-10: 100 mg via ORAL
  Filled 2015-09-10 (×2): qty 1

## 2015-09-10 MED ORDER — ADULT MULTIVITAMIN W/MINERALS CH: 1.0000 | ORAL_TABLET | Freq: Every day | ORAL | Status: DC

## 2015-09-10 MED ORDER — DOXYCYCLINE HYCLATE 100 MG PO TABS: 100.0000 mg | ORAL_TABLET | Freq: Two times a day (BID) | ORAL | Status: DC

## 2015-09-10 NOTE — Progress Notes (Signed)
Patient ID: Jesse Pope, male   DOB: 02-18-1951, 65 y.o.   MRN: [id number]         [facility] for Infectious Disease     Date of Admission:  09/07/2015   Total days of antibiotics 4  Ms. abscess has grown MRSA sensitive to doxycycline. I recommend discharge home today on doxycycline for 4 more days. I will sign off now.      Michel Bickers, MD [facility] for Infectious [place] [facility] [phone] pager   [phone] cell 07/19/2015, 1:32 PM

## 2015-09-10 NOTE — Progress Notes (Signed)
IV site was red, tender, and inflamed; removed IV cath last night. Patient stated that he did not want to restart a new IV site until he speaks with the doctor this am and stated that he may go home today.

## 2015-09-10 NOTE — Discharge Summary (Signed)
Physician Discharge Summary  NAVARRO BRANCATO [id number] DOB: Dec 10, 1950 DOA: 09/07/2015  PCP:  Melinda Crutch, MD  Admit date: 09/07/2015 Discharge date: 09/10/2015   Recommendations for Outpatient Follow-Up:   1. Recommend outpatient F/U with dermatologist. 2. PCP: Please schedule patient for a F/U chest CT in 6 months to ensure stability of pulmonary nodules.   Discharge Diagnosis:   Principal Problem:    Cellulitis and abscess of leg / MRSA infection Active Problems:    Hyponatremia    HTN (hypertension)    Cigarette smoker    Alcohol abuse    Hyperkalemia    Lactic acidosis    Nocturia associated with benign prostatic hypertrophy    Pulmonary nodules    Lactic acidosis   Discharge disposition:  Home.   Discharge Condition: Improved.  Diet recommendation: Regular.   History of Present Illness:   Jesse Pope is an 65 y.o. male with a past medical history of hypertension, chronic hyponatremia, tobacco abuse, alcohol abuse (drinks a six pack daily), prior MRSA infections who was admitted 09/07/15 with a right thigh "sore" associated with surrounding erythema, edema, tenderness. In the emergency department, the patient underwent an I&D of his right thigh abscess by Dr. Ralene Bathe and we are admitting him for IV antibiotic therapy. Workup was significant for mild lactic acidosis, hyponatremia, hyperkalemia and abnormal chest radiograph with 2 nodular lesions for which radiology has recommended nonemergent CT scanning of the chest with contrast.    Hospital Course by Problem:   Principal Problem:  Cellulitis and abscess of leg/likely recurrent MRSA - Status post I & D in the ER. Cultures +MRSA. - S/P 4 days of IV vancomycin.  Seen by ID.  D/C on 4 more days of Doxycycline per recommendations. - The patient was advised to follow-up with dermatology for skin biopsy. He tells me he has a history of cancer, and squamous cell carcinoma of the right leg is noted in  his problem list.  Active Problems:  Hyponatremia - Improved with IVF. Chronic.  HTN (hypertension) - Continue amlodipine 10 mg by mouth daily. - Continue ramipril 10 mg by mouth daily.   Cigarette smoker - Declined nicotine replacement therapy. Counseled.   Alcohol abuse - No previous history of DTs, but drinks at least 6 beers daily. - No evidence of withdrawal. - Given thiamine, magnesium, folic acid and MVI supplementation.   Hyperkalemia, resolved - Resolved with IV hydration.   Pulmonary nodules - Status post CT scan of the chest with contrast, nodules should be followed up in 6 months.   Lactic acidosis - Result after I&D of abscess, IV fluids, IV antibiotics.   Nocturia - PSA mildly elevated. - Outpatient follow-up with urology recommended.  Medical Consultants:    Dr. Michel Bickers, ID.   Discharge Exam:   Filed Vitals:   09/09/15 2045 09/10/15 0421  BP: 128/74 146/97  Pulse: 75 74  Temp: 98.1 F (36.7 C) 97.5 F (36.4 C)  Resp: 20 20   Filed Vitals:   09/09/15 1400 09/09/15 1700 09/09/15 2045 09/10/15 0421  BP: 134/69 137/77 128/74 146/97  Pulse: 71 68 75 74  Temp:   98.1 F (36.7 C) 97.5 F (36.4 C)  TempSrc:   Oral Oral  Resp: 18  20 20   Height:      Weight:      SpO2: 100%  98% 99%    Gen:  NAD Cardiovascular:  RRR, No M/R/G Respiratory: Lungs CTAB Gastrointestinal: Abdomen soft, NT/ND with  normal active bowel sounds. Extremities: Nodular lesions RLE, right thigh abscess improved   The results of significant diagnostics from this hospitalization (including imaging, microbiology, ancillary and laboratory) are listed below for reference.     Procedures and Diagnostic Studies:   Dg Chest 2 View  09/07/2015  CLINICAL DATA:  Wound on the anterior aspect of the right lower leg with pain, redness and swelling. Wound on the right foot for 6 months. Initial encounter. EXAM: CHEST  2 VIEW COMPARISON:  None. FINDINGS: The chest is  hyperexpanded with attenuation of the pulmonary vasculature. A 1.2 cm nodular opacity is seen on the frontal view in the left mid lung zone. The punctate nodular opacities identified in the right upper lobe. The lungs are otherwise clear. No pneumothorax or pleural effusion. IMPRESSION: Two nodular opacities cannot be definitively characterized. Nonemergent chest CT with contrast is recommended further evaluation. No acute disease. The chest appears emphysematous. Electronically Signed   By: Inge Rise M.D.   On: 09/07/2015 17:09   Ct Chest W Contrast  09/08/2015  CLINICAL DATA:  Pulmonary nodules. EXAM: CT CHEST WITH CONTRAST TECHNIQUE: Multidetector CT imaging of the chest was performed during intravenous contrast administration. CONTRAST:  65mL OMNIPAQUE IOHEXOL 300 MG/ML  SOLN COMPARISON:  None. FINDINGS: THORACIC INLET/BODY WALL: No acute abnormality. MEDIASTINUM: Normal heart size. No pericardial effusion. Atherosclerotic calcification, including in the LAD. No acute vascular abnormality. No adenopathy. LUNG WINDOWS: There are small scattered solid pulmonary nodules. The most notable: 4 mm 5:7 -right upper lobe 4 mm 5:17 -right upper lobe 5 mm 5:20 -right upper lobe. There are also sub solid/reticular densities which could reflect scarring or nodule. These are located in the right lower lobe on images 24 and 26 and in the left lower lobe on image 31. The largest is in the left lower lobe measuring 21 mm. No solid components are noted. The ground-glass nodules can be followed on the solid nodule algorithm initially. Hyperinflated lungs which may reflect COPD in this patient with smoking history. No emphysematous changes are seen. There is no edema, consolidation, effusion, or pneumothorax. UPPER ABDOMEN: No acute findings. OSSEOUS: No acute fracture.  No suspicious lytic or blastic lesions. IMPRESSION: Bilateral solid and sub solid pulmonary nodules. The largest solid nodule measures 5 mm. Given risk  factors for bronchogenic carcinoma, follow-up chest CT at 6 months is recommended. This recommendation follows the consensus statement: Guidelines for Management of Small Pulmonary Nodules Detected on CT Scans: A Statement from the [place] as published in Radiology 2005;237:395-400. Electronically Signed   By: Monte Fantasia M.D.   On: 09/08/2015 07:13     Labs:   Basic Metabolic Panel:  Recent Labs Lab 09/07/15 1514 09/07/15 1920 09/08/15 0414 09/09/15 0418 09/10/15 0417  NA 128*  --  131* 131* 130*  K 5.3*  --  4.5 4.2 3.9  CL 95*  --  99* 103 98*  CO2 23  --  24 20* 23  GLUCOSE 95  --  99 90 85  BUN 8  --  8 9 6   CREATININE 0.92  --  0.92 1.02 1.00  CALCIUM 9.0  --  8.4* 8.2* 8.5*  MG  --  2.1  --   --   --   PHOS  --  3.7  --   --   --    GFR Estimated Creatinine Clearance: 75.6 mL/min (by C-G formula based on Cr of 1). Liver Function Tests:  Recent Labs Lab 09/07/15 1514 09/08/15  0414  AST 26 21  ALT 19 16*  ALKPHOS 63 58  BILITOT 1.2 1.9*  PROT 8.1 6.8  ALBUMIN 4.4 3.7   CBC:  Recent Labs Lab 09/07/15 1514 09/08/15 0414 09/09/15 0418  WBC 8.5 8.3 5.5  NEUTROABS  --  6.0  --   HGB 13.9 12.8* 12.3*  HCT 40.3 37.6* 35.7*  MCV 93.3 93.1 93.5  PLT 287 273 247   Microbiology Recent Results (from the past 240 hour(s))  Wound culture     Status: None   Collection Time: 09/07/15  4:14 PM  Result Value Ref Range Status   Specimen Description ABSCESS RIGHT LEG  Final   Special Requests Normal  Final   Gram Stain   Final    NO WBC SEEN NO SQUAMOUS EPITHELIAL CELLS SEEN NO ORGANISMS SEEN Performed at [facility]    Culture   Final    FEW METHICILLIN RESISTANT STAPHYLOCOCCUS AUREUS Note: RIFAMPIN AND GENTAMICIN SHOULD NOT BE USED AS SINGLE DRUGS FOR TREATMENT OF STAPH INFECTIONS. Performed at [facility]    Report Status 09/10/2015 FINAL  Final   Organism ID, Bacteria METHICILLIN RESISTANT STAPHYLOCOCCUS AUREUS  Final       Susceptibility   Methicillin resistant staphylococcus aureus - MIC*    CLINDAMYCIN <=0.25 SENSITIVE Sensitive     ERYTHROMYCIN <=0.25 SENSITIVE Sensitive     GENTAMICIN <=0.5 SENSITIVE Sensitive     LEVOFLOXACIN 0.25 SENSITIVE Sensitive     OXACILLIN >=4 RESISTANT Resistant     RIFAMPIN <=0.5 SENSITIVE Sensitive     TRIMETH/SULFA <=10 SENSITIVE Sensitive     VANCOMYCIN <=0.5 SENSITIVE Sensitive     TETRACYCLINE <=1 SENSITIVE Sensitive     * FEW METHICILLIN RESISTANT STAPHYLOCOCCUS AUREUS     Discharge Instructions:   Discharge Instructions    Activity as tolerated - No restrictions    Complete by:  As directed      Call MD for:  extreme fatigue    Complete by:  As directed      Call MD for:  temperature >100.4    Complete by:  As directed      Diet - low sodium heart healthy    Complete by:  As directed      Discharge instructions    Complete by:  As directed   Make sure you follow up with your dermatologist.            Medication List    TAKE these medications        amLODipine 10 MG tablet  Commonly known as:  NORVASC  Take 10 mg by mouth daily.     doxycycline 100 MG tablet  Commonly known as:  VIBRA-TABS  Take 1 tablet (100 mg total) by mouth every 12 (twelve) hours.     multivitamin with minerals Tabs tablet  Take 1 tablet by mouth daily.     naproxen sodium 220 MG tablet  Commonly known as:  ANAPROX  Take 220 mg by mouth every morning.     ramipril 10 MG capsule  Commonly known as:  ALTACE  Take 10 mg by mouth daily.           Follow-up Information    Schedule an appointment as soon as possible for a visit with  Melinda Crutch, MD.   Specialty:  Family Medicine   Why:  As needed, If symptoms worsen   Contact information:   [place] [address] [phone]  Follow up with Dr. Durward Fortes at [facility]. Schedule an appointment as soon as possible for a visit in 1 week.   Why:  Follow up of nodules on right  lower leg.       Time coordinating discharge: 35 minutes.  Signed:  Henriette Hesser  Pager [phone] [facility] 09/10/2015, 4:17 PM

## 2016-05-23 DIAGNOSIS — B369 Superficial mycosis, unspecified: Secondary | ICD-10-CM | POA: Diagnosis not present

## 2016-05-23 DIAGNOSIS — Z23 Encounter for immunization: Secondary | ICD-10-CM | POA: Diagnosis not present

## 2016-05-23 DIAGNOSIS — M5136 Other intervertebral disc degeneration, lumbar region: Secondary | ICD-10-CM | POA: Diagnosis not present

## 2016-05-23 DIAGNOSIS — C801 Malignant (primary) neoplasm, unspecified: Secondary | ICD-10-CM | POA: Diagnosis not present

## 2016-05-23 DIAGNOSIS — I10 Essential (primary) hypertension: Secondary | ICD-10-CM | POA: Diagnosis not present

## 2017-04-15 DIAGNOSIS — Z1211 Encounter for screening for malignant neoplasm of colon: Secondary | ICD-10-CM | POA: Diagnosis not present

## 2017-04-15 DIAGNOSIS — Z800 Family history of malignant neoplasm of digestive organs: Secondary | ICD-10-CM | POA: Diagnosis not present

## 2017-04-23 DIAGNOSIS — Z23 Encounter for immunization: Secondary | ICD-10-CM | POA: Diagnosis not present

## 2017-09-19 DIAGNOSIS — Z0000 Encounter for general adult medical examination without abnormal findings: Secondary | ICD-10-CM | POA: Diagnosis not present

## 2017-09-19 DIAGNOSIS — Z1159 Encounter for screening for other viral diseases: Secondary | ICD-10-CM | POA: Diagnosis not present

## 2017-09-19 DIAGNOSIS — C801 Malignant (primary) neoplasm, unspecified: Secondary | ICD-10-CM | POA: Diagnosis not present

## 2017-09-19 DIAGNOSIS — Z23 Encounter for immunization: Secondary | ICD-10-CM | POA: Diagnosis not present

## 2017-09-19 DIAGNOSIS — I10 Essential (primary) hypertension: Secondary | ICD-10-CM | POA: Diagnosis not present

## 2017-09-19 DIAGNOSIS — J449 Chronic obstructive pulmonary disease, unspecified: Secondary | ICD-10-CM | POA: Diagnosis not present

## 2017-09-19 DIAGNOSIS — Z125 Encounter for screening for malignant neoplasm of prostate: Secondary | ICD-10-CM | POA: Diagnosis not present

## 2017-10-15 DIAGNOSIS — R972 Elevated prostate specific antigen [PSA]: Secondary | ICD-10-CM | POA: Diagnosis not present

## 2017-11-28 DIAGNOSIS — R972 Elevated prostate specific antigen [PSA]: Secondary | ICD-10-CM | POA: Diagnosis not present

## 2017-12-06 DIAGNOSIS — C61 Malignant neoplasm of prostate: Secondary | ICD-10-CM | POA: Diagnosis not present

## 2017-12-20 ENCOUNTER — Encounter: Payer: Self-pay | Admitting: Radiation Oncology

## 2017-12-26 ENCOUNTER — Encounter: Payer: Self-pay | Admitting: Radiation Oncology

## 2017-12-26 DIAGNOSIS — C61 Malignant neoplasm of prostate: Secondary | ICD-10-CM | POA: Insufficient documentation

## 2017-12-26 DIAGNOSIS — Z8546 Personal history of malignant neoplasm of prostate: Secondary | ICD-10-CM | POA: Insufficient documentation

## 2017-12-26 NOTE — Progress Notes (Signed)
GU Location of Tumor / Histology: prostatic adenocarcinoma  If Prostate Cancer, Gleason Score is (4 + 3) and PSA is (8.35). Prostate volume: 19.12 grams  Jesse Pope was referred to Dr. Gloriann Loan by Dr. Melinda Crutch for further evaluation of an elevated PSA.   09/26/2011   PSA  2.28.   Patient explains he did not have another physical until March 2019 thus elevated PSA was not followed up on until then.  Biopsies of prostate (if applicable) revealed:    Past/Anticipated interventions by urology, if any: prostate biopsy, referral to radiation oncology  Past/Anticipated interventions by medical oncology, if any: no  Weight changes, if any: no  Bowel/Bladder complaints, if any: Reports erectile dysfunction. Denies urinary incontinence. Denies urinary hesitancy. Denies having to strain to void. Denies any bowel complaints.   Nausea/Vomiting, if any: no  Pain issues, if any:  Patient denies pain. Patient reports he had a black surgery 10-15 years ago that "didn't help." Reports walking with a cane to relieve the pressure on his low back. Reports taking Aleve to manage his lower back pressure.  SAFETY ISSUES:  Prior radiation? no  Pacemaker/ICD? no  Possible current pregnancy? no  Is the patient on methotrexate? no  Current Complaints / other details:  67 year old male. Ax: penicillin. Single with two daughters and one son. Mother with hx of colon ca. Sister with hx of breast ca. Resides in [place]. Works limited hours as a Development worker, community. Has no grandchildren yet. Patient evaluated by Dr. Valere Dross in 2016 for consideration of radiation treatment to basal cell carcinoma lower legs. Patient reports Dr. Valere Dross wanted him to stop smoking before he would treat the area and patient decided to not stop smoking. Patient reports the basal cell area on his lower leg isn't bother him nor getting larger.

## 2017-12-27 ENCOUNTER — Ambulatory Visit
Admission: RE | Admit: 2017-12-27 | Discharge: 2017-12-27 | Disposition: A | Discharge status: Home / Self Care | Payer: Medicare HMO | Source: Ambulatory Visit | Attending: Radiation Oncology | Admitting: Radiation Oncology

## 2017-12-27 ENCOUNTER — Ambulatory Visit
Admission: RE | Admit: 2017-12-27 | Discharge: 2017-12-27 | Disposition: A | Discharge status: Home / Self Care | Payer: Medicare HMO | Source: Ambulatory Visit

## 2017-12-27 ENCOUNTER — Encounter: Payer: Self-pay | Admitting: Radiation Oncology

## 2017-12-27 ENCOUNTER — Other Ambulatory Visit: Payer: Self-pay

## 2017-12-27 VITALS — BP 149/80 | HR 74 | Temp 97.9°F | Resp 18 | Wt 171.6 lb

## 2017-12-27 DIAGNOSIS — C61 Malignant neoplasm of prostate: Secondary | ICD-10-CM | POA: Diagnosis not present

## 2017-12-27 DIAGNOSIS — C44722 Squamous cell carcinoma of skin of right lower limb, including hip: Secondary | ICD-10-CM | POA: Diagnosis not present

## 2017-12-27 DIAGNOSIS — R972 Elevated prostate specific antigen [PSA]: Secondary | ICD-10-CM | POA: Diagnosis not present

## 2017-12-27 DIAGNOSIS — F17210 Nicotine dependence, cigarettes, uncomplicated: Secondary | ICD-10-CM | POA: Diagnosis not present

## 2017-12-27 HISTORY — DX: Malignant neoplasm of prostate: C61

## 2017-12-27 NOTE — Progress Notes (Signed)
Radiation Oncology         [phone] ________________________________  Initial Outpatient Consultation  Name: Jesse Pope MRN: [id number]  Date: 12/27/2017  DOB: May 19, 1951  WU:JWJX, Dwyane Luo, MD  Lucas Mallow, MD   REFERRING PHYSICIAN: Lucas Mallow, MD  DIAGNOSIS: 67 y.o. gentleman with Stage T1c adenocarcinoma of the prostate with Gleason Score of 4+3, and PSA of 5.74    ICD-10-CM   1. Malignant neoplasm of prostate ([place]) C61     HISTORY OF PRESENT ILLNESS: Jesse Pope is a 67 y.o. male with a diagnosis of prostate cancer. He was noted to have an elevated PSA of 8.35 on 09/19/2017 by his primary care physician, Dr. Myriam Jacobson.  Prior PSA was 2.28 on 09/26/2011.  Accordingly, he was referred for evaluation in urology by Dr. Link Snuffer, III on 10/15/2017,  digital rectal examination was performed at that time revealing symmetrical lobes without discrete nodularity.   Repeat PSA was 5.74 on 10/15/2017.  The patient proceeded to transrectal ultrasound with 12 biopsies of the prostate on 11/28/2017.  The prostate volume measured 19.12 gm.  Out of 12 core biopsies,11 were positive.  The maximum Gleason score was 4+3, and this was seen in the left and right base, right base lateral and right mid gland.  Additionally, there was Gleason 3+4 disease in the left base lateral, left mid, left mid lateral, right mid lateral and right apex as well as Gleason 3+3 disease in the left apex lateral and right apex lateral.    The patient also has a history of squamous cell carcinoma on the right lower leg. He was previously evaluated by Dr. Valere Dross for consideration of radiotherapy to the right lower leg. He was offered a course of radiotherapy but was advised to stop smoking and decided he would not stop smoking so he has not sought any further treatment of the skin lesion.  This lesion has continued to enlarge but he denies active bleeding or pain associated with the lesion.   The  patient reviewed the biopsy results with his urologist and he has kindly been referred today for discussion of potential radiation treatment options.   PREVIOUS RADIATION THERAPY: No  PAST MEDICAL HISTORY:  Past Medical History:  Diagnosis Date  . Alcohol abuse 09/07/2015  . Basal cell carcinoma of right lower leg   . Cellulitis and abscess of leg 09/07/2015  . Cigarette smoker 02/11/2014  . Eczema 02/11/2014  . Hypertension   . Hyponatremia 02/08/2014  . MRSA (methicillin resistant Staphylococcus aureus)   . Nocturia associated with benign prostatic hypertrophy 09/07/2015  . Prostate cancer ([place])   . Pulmonary nodules 09/10/2015      PAST SURGICAL HISTORY: Past Surgical History:  Procedure Laterality Date  . BACK SURGERY    . Biospy of Right Lower Extremity  01/27/2014   SCC  . I&D EXTREMITY Left 02/10/2014   Procedure: IRRIGATION AND DEBRIDEMENT EXTREMITY/PRE PATELLA BURSA;  Surgeon: Marin Shutter, MD;  Location: [facility];  Service: Orthopedics;  Laterality: Left;  . I&D EXTREMITY Left 02/15/2014   Procedure: IRRIGATION AND DEBRIDEMENT EXTREMITY/DELAY CLOSURE OF LEFT KNEE;  Surgeon: Marin Shutter, MD;  Location: [facility];  Service: Orthopedics;  Laterality: Left;    FAMILY HISTORY:  Family History  Problem Relation Age of Onset  . Colon cancer Mother   . Crohn's disease Father   . Breast cancer Sister     SOCIAL HISTORY:  Social History  Socioeconomic History  . Marital status: Married    Spouse name: Not on file  . Number of children: 3  . Years of education: Not on file  . Highest education level: Not on file  Occupational History  . Occupation: Development worker, community    Comment: limited hours  Social Needs  . Financial resource strain: Not on file  . Food insecurity:    Worry: Not on file    Inability: Not on file  . Transportation needs:    Medical: Not on file    Non-medical: Not on file  Tobacco Use  . Smoking status: Current Every Day Smoker    Types: Cigars  . Smokeless  tobacco: Never Used  . Tobacco comment: uncertain of number of cigars smoked per day  Substance and Sexual Activity  . Alcohol use: Yes    Alcohol/week: 0.0 oz    Comment: Drinks Alcohol daily  . Drug use: No  . Sexual activity: Not Currently  Lifestyle  . Physical activity:    Days per week: Not on file    Minutes per session: Not on file  . Stress: Not on file  Relationships  . Social connections:    Talks on phone: Not on file    Gets together: Not on file    Attends religious service: Not on file    Active member of club or organization: Not on file    Attends meetings of clubs or organizations: Not on file    Relationship status: Not on file  . Intimate partner violence:    Fear of current or ex partner: Not on file    Emotionally abused: Not on file    Physically abused: Not on file    Forced sexual activity: Not on file  Other Topics Concern  . Not on file  Social History Narrative   Patient resides in [place] independently. Patient has two daughters and one son. Does not have any grandchildren yet. Works limited hours as a Development worker, community.    ALLERGIES: Penicillins  MEDICATIONS:  Current Outpatient Medications  Medication Sig Dispense Refill  . amLODipine (NORVASC) 10 MG tablet Take 10 mg by mouth daily.    . naproxen sodium (ANAPROX) 220 MG tablet Take 220 mg by mouth every morning.    . ramipril (ALTACE) 10 MG capsule Take 10 mg by mouth daily.     No current facility-administered medications for this encounter.     REVIEW OF SYSTEMS:  On review of systems, the patient reports that he is doing well overall. He denies any chest pain, shortness of breath, cough, fevers, chills, night sweats, unintended weight changes. He was told by his PCP, Lawerance Cruel, MD that he has COPD, however, he does not use inhalers or require oxygen at home. He denies any bowel disturbances, and denies abdominal pain, nausea or vomiting. He denies any new musculoskeletal or joint aches  or pains. His IPSS was 3, indicating mild urinary symptoms. He is almost never able to complete sexual activity, severe ED. A complete review of systems is obtained and is otherwise negative.    PHYSICAL EXAM:  Wt Readings from Last 3 Encounters:  12/27/17 171 lb 9.6 oz (77.8 kg)  09/07/15 160 lb (72.6 kg)  01/12/15 152 lb 1.6 oz (69 kg)   Temp Readings from Last 3 Encounters:  12/27/17 97.9 F (36.6 C) (Oral)  09/10/15 97.5 F (36.4 C) (Oral)  01/12/15 98.3 F (36.8 C)   BP Readings from Last 3 Encounters:  12/27/17 Marland Kitchen)  149/80  09/10/15 (!) 146/97  01/12/15 130/74   Pulse Readings from Last 3 Encounters:  12/27/17 74  09/10/15 74  01/12/15 76   Pain Assessment Pain Score: 0-No pain/10  In general this is a well appearing 67 y.o. male in no acute distress. He is alert and oriented x4 and appropriate throughout the examination. HEENT reveals that the patient is normocephalic, atraumatic. EOMs are intact. PERRLA. Skin is intact without any evidence of gross lesions. Cardiovascular exam reveals a regular rate and rhythm, no clicks rubs or murmurs are auscultated. Chest is clear to auscultation bilaterally. Lymphatic assessment is performed and does not reveal any adenopathy in the cervical, supraclavicular, axillary, or inguinal chains. Abdomen has active bowel sounds in all quadrants and is intact. The abdomen is soft, non tender, non distended. Lower extremities are negative for pretibial pitting edema, deep calf tenderness, cyanosis or clubbing. On the right lower shin there is very large, fungating squamous cell carcinoma encompassing the majority of the anterior right lower leg/shin. There is no bleeding or discharge noted.   KPS = 90  100 - Normal; no complaints; no evidence of disease. 90   - Able to carry on normal activity; minor signs or symptoms of disease. 80   - Normal activity with effort; some signs or symptoms of disease. 37   - Cares for self; unable to carry on  normal activity or to do active work. 60   - Requires occasional assistance, but is able to care for most of his personal needs. 50   - Requires considerable assistance and frequent medical care. 59   - Disabled; requires special care and assistance. 67   - Severely disabled; hospital admission is indicated although death not imminent. 47   - Very sick; hospital admission necessary; active supportive treatment necessary. 10   - Moribund; fatal processes progressing rapidly. 0     - Dead  Karnofsky DA, Abelmann [place], Craver LS and Burchenal [facility] [phone]) The use of the nitrogen mustards in the palliative treatment of carcinoma: with particular reference to bronchogenic carcinoma Cancer 1 [phone]  LABORATORY DATA:  Lab Results  Component Value Date   WBC 5.5 09/09/2015   HGB 12.3 (L) 09/09/2015   HCT 35.7 (L) 09/09/2015   MCV 93.5 09/09/2015   PLT 247 09/09/2015   Lab Results  Component Value Date   NA 130 (L) 09/10/2015   K 3.9 09/10/2015   CL 98 (L) 09/10/2015   CO2 23 09/10/2015   Lab Results  Component Value Date   ALT 16 (L) 09/08/2015   AST 21 09/08/2015   ALKPHOS 58 09/08/2015   BILITOT 1.9 (H) 09/08/2015     RADIOGRAPHY: No results found.    IMPRESSION/PLAN: 1. 67 y.o. gentleman with Stage T1c adenocarcinoma of the prostate with Gleason Score of 4+3, and PSA of 5.74. We discussed the patient's workup and outlined the nature of prostate cancer in this setting. The patient's T stage, Gleason's score, and PSA put him into the unfavorable intermediate risk group. Accordingly, he is eligible for a variety of potential treatment options including brachytherapy, 5.5 weeks of external radiation or 5 weeks of external radiation followed by a brachytherapy boost. We discussed the available radiation techniques, and focused on the details and logistics and delivery.  We discussed and outlined the risks, benefits, short and long-term effects associated with radiotherapy and compared and  contrasted these with prostatectomy.  We discussed the role of ADT in the treatment of prostate cancer and  outlined the expected associated effects with this therapy.  In the setting of intermediate risk prostate cancer, ADT has not been shown to have a significant impact on overall survival when used in combination with radiotherapy.  Therefore, this is not strongly recommended in his case.  At the conclusion of our conversation, the patient elects to proceed with a 5-1/2-week course of daily radiotherapy we also discussed the role of SpaceOAR in reducing the rectal toxicity associated with radiotherapy.  He was encouraged to ask questions that were answered to the best of our ability.  At the conclusion of our conversation, the patient elects to proceed with a 5-1/2-week course of external beam radiotherapy to the prostate without the use of ADT.  We will share our findings with Dr. Gloriann Loan and proceed with scheduling a follow-up visit at [facility] for fiducial markers and SpaceOAR prior to CT simulation/treatment planning in the near future.  He knows to call with any questions or concerns in the interim.   2. Squamous cell carcinoma of the right lower leg.  We discussed the available radiation techniques, and focused on the details of logistics and delivery.  We discussed and outlined the risks, benefits, short and long-term effects associated with radiotherapy in the management of his disease. The patient would also like to proceed with a course of radiotherapy to the right lower leg.  He will be scheduled for CT SIM in the near future.   We enjoyed meeting him today and look forward to participating in the care of this nice gentleman.  We spent 60 minutes minutes face to face with the patient and more than 50% of that time was spent in counseling and/or coordination of care.    Nicholos Johns, PA-C    Tyler Pita, MD  [facility] Direct Dial: [phone]  Fax:  [phone] [email or web address]  Skype  LinkedIn   This document serves as a record of services personally performed by Tyler Pita, MD and Freeman Caldron, PA-C. It was created on their behalf by Margit Banda, a trained medical scribe. The creation of this record is based on the scribe's personal observations and the provider's statements to them. This document has been checked and approved by the attending provider.

## 2017-12-27 NOTE — Progress Notes (Signed)
See progress note under physician encounter. 

## 2017-12-30 ENCOUNTER — Ambulatory Visit: Payer: Medicare Other | Admitting: Radiation Oncology

## 2018-01-10 ENCOUNTER — Other Ambulatory Visit: Payer: Self-pay | Admitting: Urology

## 2018-01-10 ENCOUNTER — Telehealth: Payer: Self-pay | Admitting: *Deleted

## 2018-01-10 DIAGNOSIS — C61 Malignant neoplasm of prostate: Secondary | ICD-10-CM

## 2018-01-10 NOTE — Telephone Encounter (Signed)
CALLED PATIENT TO INFORM OF SIM APPT. ON 01-31-18 @ 10 AM @ DR. MANNING'S OFFICE, PT. TO HAVE FID. MARKERS AND SPACE OAR ON 01/28/18 @ ([facility]), LVM FOR A RETURN CALL

## 2018-01-17 DIAGNOSIS — C61 Malignant neoplasm of prostate: Secondary | ICD-10-CM | POA: Diagnosis not present

## 2018-01-22 ENCOUNTER — Telehealth: Payer: Self-pay | Admitting: Radiation Oncology

## 2018-01-22 NOTE — Telephone Encounter (Signed)
LVM for Jesse Pope for MRI pelvis to be done 7:30am arrival on 7/19 before CT Sim.

## 2018-01-28 DIAGNOSIS — C61 Malignant neoplasm of prostate: Secondary | ICD-10-CM | POA: Diagnosis not present

## 2018-01-30 ENCOUNTER — Telehealth: Payer: Self-pay | Admitting: *Deleted

## 2018-01-30 NOTE — Telephone Encounter (Signed)
CALLED PATIENT TO REMIND OF MRI AND SIM APPT. FOR 01-31-18, LVM FOR A RETURN CALL

## 2018-01-31 ENCOUNTER — Ambulatory Visit
Admission: RE | Admit: 2018-01-31 | Discharge: 2018-01-31 | Disposition: A | Discharge status: Home / Self Care | Payer: Medicare HMO | Source: Ambulatory Visit | Attending: Radiation Oncology | Admitting: Radiation Oncology

## 2018-01-31 ENCOUNTER — Encounter: Payer: Self-pay | Admitting: Medical Oncology

## 2018-01-31 ENCOUNTER — Ambulatory Visit (HOSPITAL_COMMUNITY)
Admission: RE | Admit: 2018-01-31 | Discharge: 2018-01-31 | Disposition: A | Discharge status: Home / Self Care | Payer: Medicare HMO | Source: Ambulatory Visit | Attending: Urology | Admitting: Urology

## 2018-01-31 DIAGNOSIS — Z510 Encounter for antineoplastic radiation therapy: Secondary | ICD-10-CM | POA: Insufficient documentation

## 2018-01-31 DIAGNOSIS — C61 Malignant neoplasm of prostate: Secondary | ICD-10-CM | POA: Insufficient documentation

## 2018-01-31 NOTE — Progress Notes (Signed)
  Radiation Oncology         [phone] ________________________________  Name: Jesse Pope MRN: [id number]  Date: 01/31/2018  DOB: 28-Feb-1951  SIMULATION AND TREATMENT PLANNING NOTE    ICD-10-CM   1. Malignant neoplasm of prostate ([place]) C61     DIAGNOSIS:   67 y.o. gentleman with Stage T1c adenocarcinoma of the prostate with Gleason Score of 4+3, and PSA of 5.74   NARRATIVE:  The patient was brought to the [facility].  Identity was confirmed.  All relevant records and images related to the planned course of therapy were reviewed.  The patient freely provided informed written consent to proceed with treatment after reviewing the details related to the planned course of therapy. The consent form was witnessed and verified by the simulation staff.  Then, the patient was set-up in a stable reproducible supine position for radiation therapy.  A vacuum lock pillow device was custom fabricated to position his legs in a reproducible immobilized position.  Then, I performed a urethrogram under sterile conditions to identify the prostatic apex.  CT images were obtained.  Surface markings were placed.  The CT images were loaded into the planning software.  Then the prostate target and avoidance structures including the rectum, bladder, bowel and hips were contoured.  Treatment planning then occurred.  The radiation prescription was entered and confirmed.  A total of one complex treatment devices was fabricated. I have requested : Intensity Modulated Radiotherapy (IMRT) is medically necessary for this case for the following reason:  Rectal sparing.Marland Kitchen  PLAN:  The patient will receive 70 Gy in 28 fractions.  With regard to the squamous cell carcinoma of his shin, I have spoken with Dr. Marla Roe and will refer him to her for consultation.  ________________________________  Sheral Apley Tammi Klippel, M.D.  This document serves as a record of services personally performed by Tyler Pita, MD. It was created on his behalf by Vanessa Ralphs, a trained medical scribe. The creation of this record is based on the scribe's personal observations and the provider's statements to them. This document has been checked and approved by the attending provider.

## 2018-01-31 NOTE — Progress Notes (Signed)
Introduced myself to Jesse Pope as the prostate nurse navigator and my role. I was unable to meet him 6/14 when he consulted with Dr. Tammi Klippel. He is here to [place] and will begin radiation 7/30. He also has a squamous cell cancer on his right lower leg. Dr. Tammi Klippel had discussed doing radiation to his leg. Per Dr. Tammi Klippel he has spoke with Dr. Ramond Craver surgeon to discuss his case. He will be referred to Dr. Marla Roe after the completion of prostate radiation to address the leg cancer.

## 2018-02-04 DIAGNOSIS — Z510 Encounter for antineoplastic radiation therapy: Secondary | ICD-10-CM | POA: Diagnosis not present

## 2018-02-04 DIAGNOSIS — C61 Malignant neoplasm of prostate: Secondary | ICD-10-CM | POA: Diagnosis not present

## 2018-02-11 ENCOUNTER — Ambulatory Visit
Admission: RE | Admit: 2018-02-11 | Discharge: 2018-02-11 | Disposition: A | Discharge status: Home / Self Care | Payer: Medicare HMO | Source: Ambulatory Visit | Attending: Radiation Oncology | Admitting: Radiation Oncology

## 2018-02-11 ENCOUNTER — Encounter: Payer: Self-pay | Admitting: Medical Oncology

## 2018-02-11 DIAGNOSIS — Z510 Encounter for antineoplastic radiation therapy: Secondary | ICD-10-CM | POA: Diagnosis not present

## 2018-02-11 DIAGNOSIS — C61 Malignant neoplasm of prostate: Secondary | ICD-10-CM | POA: Diagnosis not present

## 2018-02-12 ENCOUNTER — Ambulatory Visit
Admission: RE | Admit: 2018-02-12 | Discharge: 2018-02-12 | Disposition: A | Discharge status: Home / Self Care | Payer: Medicare HMO | Source: Ambulatory Visit | Attending: Radiation Oncology | Admitting: Radiation Oncology

## 2018-02-12 DIAGNOSIS — C61 Malignant neoplasm of prostate: Secondary | ICD-10-CM | POA: Diagnosis not present

## 2018-02-12 DIAGNOSIS — Z510 Encounter for antineoplastic radiation therapy: Secondary | ICD-10-CM | POA: Diagnosis not present

## 2018-02-13 ENCOUNTER — Ambulatory Visit
Admission: RE | Admit: 2018-02-13 | Discharge: 2018-02-13 | Disposition: A | Discharge status: Home / Self Care | Payer: Medicare HMO | Source: Ambulatory Visit | Attending: Radiation Oncology | Admitting: Radiation Oncology

## 2018-02-13 DIAGNOSIS — C61 Malignant neoplasm of prostate: Secondary | ICD-10-CM | POA: Diagnosis not present

## 2018-02-13 DIAGNOSIS — Z510 Encounter for antineoplastic radiation therapy: Secondary | ICD-10-CM | POA: Diagnosis not present

## 2018-02-14 ENCOUNTER — Ambulatory Visit
Admission: RE | Admit: 2018-02-14 | Discharge: 2018-02-14 | Disposition: A | Discharge status: Home / Self Care | Payer: Medicare HMO | Source: Ambulatory Visit | Attending: Radiation Oncology | Admitting: Radiation Oncology

## 2018-02-14 ENCOUNTER — Other Ambulatory Visit: Payer: Self-pay | Admitting: Radiation Oncology

## 2018-02-14 DIAGNOSIS — C61 Malignant neoplasm of prostate: Secondary | ICD-10-CM | POA: Diagnosis not present

## 2018-02-14 DIAGNOSIS — Z510 Encounter for antineoplastic radiation therapy: Secondary | ICD-10-CM | POA: Diagnosis not present

## 2018-02-14 MED ORDER — METHOCARBAMOL 500 MG PO TABS: 500.0000 mg | ORAL_TABLET | Freq: Four times a day (QID) | ORAL | 5 refills | Status: AC | PRN

## 2018-02-14 NOTE — Progress Notes (Signed)
Pt here for patient teaching.  Pt given Radiation and You booklet and skin care instructions.  Reviewed areas of pertinence such as diarrhea, fatigue and urinary and bladder changes . Pt able to give teach back of have Imodium on hand and drink plenty of water,. Pt demonstrated understanding, needs reinforcement, no evidence of learning, refused teaching and  of information given and will contact nursing with any questions or concerns.     [email or web address]

## 2018-02-17 ENCOUNTER — Ambulatory Visit
Admission: RE | Admit: 2018-02-17 | Discharge: 2018-02-17 | Disposition: A | Discharge status: Home / Self Care | Payer: Medicare HMO | Source: Ambulatory Visit | Attending: Radiation Oncology | Admitting: Radiation Oncology

## 2018-02-17 DIAGNOSIS — C61 Malignant neoplasm of prostate: Secondary | ICD-10-CM | POA: Diagnosis not present

## 2018-02-17 DIAGNOSIS — Z510 Encounter for antineoplastic radiation therapy: Secondary | ICD-10-CM | POA: Diagnosis not present

## 2018-02-18 ENCOUNTER — Ambulatory Visit
Admission: RE | Admit: 2018-02-18 | Discharge: 2018-02-18 | Disposition: A | Discharge status: Home / Self Care | Payer: Medicare HMO | Source: Ambulatory Visit | Attending: Radiation Oncology | Admitting: Radiation Oncology

## 2018-02-18 DIAGNOSIS — C61 Malignant neoplasm of prostate: Secondary | ICD-10-CM | POA: Diagnosis not present

## 2018-02-18 DIAGNOSIS — Z510 Encounter for antineoplastic radiation therapy: Secondary | ICD-10-CM | POA: Diagnosis not present

## 2018-02-19 ENCOUNTER — Ambulatory Visit
Admission: RE | Admit: 2018-02-19 | Discharge: 2018-02-19 | Disposition: A | Discharge status: Home / Self Care | Payer: Medicare HMO | Source: Ambulatory Visit | Attending: Radiation Oncology | Admitting: Radiation Oncology

## 2018-02-19 DIAGNOSIS — Z510 Encounter for antineoplastic radiation therapy: Secondary | ICD-10-CM | POA: Diagnosis not present

## 2018-02-19 DIAGNOSIS — C61 Malignant neoplasm of prostate: Secondary | ICD-10-CM | POA: Diagnosis not present

## 2018-02-20 ENCOUNTER — Other Ambulatory Visit: Payer: Self-pay | Admitting: Radiation Oncology

## 2018-02-20 ENCOUNTER — Ambulatory Visit
Admission: RE | Admit: 2018-02-20 | Discharge: 2018-02-20 | Disposition: A | Discharge status: Home / Self Care | Payer: Medicare HMO | Source: Ambulatory Visit | Attending: Radiation Oncology | Admitting: Radiation Oncology

## 2018-02-20 DIAGNOSIS — Z510 Encounter for antineoplastic radiation therapy: Secondary | ICD-10-CM | POA: Diagnosis not present

## 2018-02-20 DIAGNOSIS — C61 Malignant neoplasm of prostate: Secondary | ICD-10-CM | POA: Diagnosis not present

## 2018-02-20 MED ORDER — TAMSULOSIN HCL 0.4 MG PO CAPS
0.4000 mg | ORAL_CAPSULE | Freq: Every day | ORAL | 5 refills | Status: DC
Start: 2018-02-20 — End: 2018-08-18

## 2018-02-21 ENCOUNTER — Ambulatory Visit
Admission: RE | Admit: 2018-02-21 | Discharge: 2018-02-21 | Disposition: A | Discharge status: Home / Self Care | Payer: Medicare HMO | Source: Ambulatory Visit | Attending: Radiation Oncology | Admitting: Radiation Oncology

## 2018-02-21 DIAGNOSIS — Z510 Encounter for antineoplastic radiation therapy: Secondary | ICD-10-CM | POA: Diagnosis not present

## 2018-02-21 DIAGNOSIS — C61 Malignant neoplasm of prostate: Secondary | ICD-10-CM | POA: Diagnosis not present

## 2018-02-24 ENCOUNTER — Ambulatory Visit
Admission: RE | Admit: 2018-02-24 | Discharge: 2018-02-24 | Disposition: A | Discharge status: Home / Self Care | Payer: Medicare HMO | Source: Ambulatory Visit | Attending: Radiation Oncology | Admitting: Radiation Oncology

## 2018-02-24 DIAGNOSIS — Z510 Encounter for antineoplastic radiation therapy: Secondary | ICD-10-CM | POA: Diagnosis not present

## 2018-02-24 DIAGNOSIS — C61 Malignant neoplasm of prostate: Secondary | ICD-10-CM | POA: Diagnosis not present

## 2018-02-25 ENCOUNTER — Ambulatory Visit
Admission: RE | Admit: 2018-02-25 | Discharge: 2018-02-25 | Disposition: A | Discharge status: Home / Self Care | Payer: Medicare HMO | Source: Ambulatory Visit | Attending: Radiation Oncology | Admitting: Radiation Oncology

## 2018-02-25 DIAGNOSIS — C61 Malignant neoplasm of prostate: Secondary | ICD-10-CM | POA: Diagnosis not present

## 2018-02-25 DIAGNOSIS — Z510 Encounter for antineoplastic radiation therapy: Secondary | ICD-10-CM | POA: Diagnosis not present

## 2018-02-26 ENCOUNTER — Ambulatory Visit
Admission: RE | Admit: 2018-02-26 | Discharge: 2018-02-26 | Disposition: A | Discharge status: Home / Self Care | Payer: Medicare HMO | Source: Ambulatory Visit | Attending: Radiation Oncology | Admitting: Radiation Oncology

## 2018-02-26 DIAGNOSIS — C61 Malignant neoplasm of prostate: Secondary | ICD-10-CM | POA: Diagnosis not present

## 2018-02-26 DIAGNOSIS — Z510 Encounter for antineoplastic radiation therapy: Secondary | ICD-10-CM | POA: Diagnosis not present

## 2018-02-27 ENCOUNTER — Ambulatory Visit
Admission: RE | Admit: 2018-02-27 | Discharge: 2018-02-27 | Disposition: A | Discharge status: Home / Self Care | Payer: Medicare HMO | Source: Ambulatory Visit | Attending: Radiation Oncology | Admitting: Radiation Oncology

## 2018-02-27 DIAGNOSIS — C61 Malignant neoplasm of prostate: Secondary | ICD-10-CM | POA: Diagnosis not present

## 2018-02-27 DIAGNOSIS — Z510 Encounter for antineoplastic radiation therapy: Secondary | ICD-10-CM | POA: Diagnosis not present

## 2018-02-28 ENCOUNTER — Ambulatory Visit
Admission: RE | Admit: 2018-02-28 | Discharge: 2018-02-28 | Disposition: A | Discharge status: Home / Self Care | Payer: Medicare HMO | Source: Ambulatory Visit | Attending: Radiation Oncology | Admitting: Radiation Oncology

## 2018-02-28 DIAGNOSIS — Z510 Encounter for antineoplastic radiation therapy: Secondary | ICD-10-CM | POA: Diagnosis not present

## 2018-02-28 DIAGNOSIS — C61 Malignant neoplasm of prostate: Secondary | ICD-10-CM | POA: Diagnosis not present

## 2018-03-02 ENCOUNTER — Ambulatory Visit: Admission: RE | Admit: 2018-03-02 | Payer: Medicare HMO | Source: Ambulatory Visit

## 2018-03-03 ENCOUNTER — Ambulatory Visit
Admission: RE | Admit: 2018-03-03 | Discharge: 2018-03-03 | Disposition: A | Discharge status: Home / Self Care | Payer: Medicare HMO | Source: Ambulatory Visit | Attending: Radiation Oncology | Admitting: Radiation Oncology

## 2018-03-03 DIAGNOSIS — C61 Malignant neoplasm of prostate: Secondary | ICD-10-CM | POA: Diagnosis not present

## 2018-03-03 DIAGNOSIS — Z510 Encounter for antineoplastic radiation therapy: Secondary | ICD-10-CM | POA: Diagnosis not present

## 2018-03-04 ENCOUNTER — Ambulatory Visit
Admission: RE | Admit: 2018-03-04 | Discharge: 2018-03-04 | Disposition: A | Discharge status: Home / Self Care | Payer: Medicare HMO | Source: Ambulatory Visit | Attending: Radiation Oncology | Admitting: Radiation Oncology

## 2018-03-04 DIAGNOSIS — C61 Malignant neoplasm of prostate: Secondary | ICD-10-CM | POA: Diagnosis not present

## 2018-03-04 DIAGNOSIS — Z510 Encounter for antineoplastic radiation therapy: Secondary | ICD-10-CM | POA: Diagnosis not present

## 2018-03-05 ENCOUNTER — Ambulatory Visit
Admission: RE | Admit: 2018-03-05 | Discharge: 2018-03-05 | Disposition: A | Discharge status: Home / Self Care | Payer: Medicare HMO | Source: Ambulatory Visit | Attending: Radiation Oncology | Admitting: Radiation Oncology

## 2018-03-05 DIAGNOSIS — Z510 Encounter for antineoplastic radiation therapy: Secondary | ICD-10-CM | POA: Diagnosis not present

## 2018-03-05 DIAGNOSIS — C61 Malignant neoplasm of prostate: Secondary | ICD-10-CM | POA: Diagnosis not present

## 2018-03-06 ENCOUNTER — Ambulatory Visit
Admission: RE | Admit: 2018-03-06 | Discharge: 2018-03-06 | Disposition: A | Discharge status: Home / Self Care | Payer: Medicare HMO | Source: Ambulatory Visit | Attending: Radiation Oncology | Admitting: Radiation Oncology

## 2018-03-06 DIAGNOSIS — C61 Malignant neoplasm of prostate: Secondary | ICD-10-CM | POA: Diagnosis not present

## 2018-03-06 DIAGNOSIS — Z510 Encounter for antineoplastic radiation therapy: Secondary | ICD-10-CM | POA: Diagnosis not present

## 2018-03-07 ENCOUNTER — Ambulatory Visit
Admission: RE | Admit: 2018-03-07 | Discharge: 2018-03-07 | Disposition: A | Discharge status: Home / Self Care | Payer: Medicare HMO | Source: Ambulatory Visit | Attending: Radiation Oncology | Admitting: Radiation Oncology

## 2018-03-07 DIAGNOSIS — C61 Malignant neoplasm of prostate: Secondary | ICD-10-CM | POA: Diagnosis not present

## 2018-03-07 DIAGNOSIS — Z510 Encounter for antineoplastic radiation therapy: Secondary | ICD-10-CM | POA: Diagnosis not present

## 2018-03-10 ENCOUNTER — Ambulatory Visit
Admission: RE | Admit: 2018-03-10 | Discharge: 2018-03-10 | Disposition: A | Discharge status: Home / Self Care | Payer: Medicare HMO | Source: Ambulatory Visit | Attending: Radiation Oncology | Admitting: Radiation Oncology

## 2018-03-10 DIAGNOSIS — Z510 Encounter for antineoplastic radiation therapy: Secondary | ICD-10-CM | POA: Diagnosis not present

## 2018-03-10 DIAGNOSIS — C61 Malignant neoplasm of prostate: Secondary | ICD-10-CM | POA: Diagnosis not present

## 2018-03-11 ENCOUNTER — Ambulatory Visit
Admission: RE | Admit: 2018-03-11 | Discharge: 2018-03-11 | Disposition: A | Discharge status: Home / Self Care | Payer: Medicare HMO | Source: Ambulatory Visit | Attending: Radiation Oncology | Admitting: Radiation Oncology

## 2018-03-11 DIAGNOSIS — Z510 Encounter for antineoplastic radiation therapy: Secondary | ICD-10-CM | POA: Diagnosis not present

## 2018-03-11 DIAGNOSIS — C61 Malignant neoplasm of prostate: Secondary | ICD-10-CM | POA: Diagnosis not present

## 2018-03-12 ENCOUNTER — Ambulatory Visit
Admission: RE | Admit: 2018-03-12 | Discharge: 2018-03-12 | Disposition: A | Discharge status: Home / Self Care | Payer: Medicare HMO | Source: Ambulatory Visit | Attending: Radiation Oncology | Admitting: Radiation Oncology

## 2018-03-12 DIAGNOSIS — Z510 Encounter for antineoplastic radiation therapy: Secondary | ICD-10-CM | POA: Diagnosis not present

## 2018-03-12 DIAGNOSIS — C61 Malignant neoplasm of prostate: Secondary | ICD-10-CM | POA: Diagnosis not present

## 2018-03-13 ENCOUNTER — Ambulatory Visit
Admission: RE | Admit: 2018-03-13 | Discharge: 2018-03-13 | Disposition: A | Discharge status: Home / Self Care | Payer: Medicare HMO | Source: Ambulatory Visit | Attending: Radiation Oncology | Admitting: Radiation Oncology

## 2018-03-13 DIAGNOSIS — C61 Malignant neoplasm of prostate: Secondary | ICD-10-CM | POA: Diagnosis not present

## 2018-03-13 DIAGNOSIS — Z510 Encounter for antineoplastic radiation therapy: Secondary | ICD-10-CM | POA: Diagnosis not present

## 2018-03-14 ENCOUNTER — Ambulatory Visit
Admission: RE | Admit: 2018-03-14 | Discharge: 2018-03-14 | Disposition: A | Discharge status: Home / Self Care | Payer: Medicare HMO | Source: Ambulatory Visit | Attending: Radiation Oncology | Admitting: Radiation Oncology

## 2018-03-14 DIAGNOSIS — C61 Malignant neoplasm of prostate: Secondary | ICD-10-CM | POA: Diagnosis not present

## 2018-03-14 DIAGNOSIS — Z510 Encounter for antineoplastic radiation therapy: Secondary | ICD-10-CM | POA: Diagnosis not present

## 2018-03-18 ENCOUNTER — Ambulatory Visit
Admission: RE | Admit: 2018-03-18 | Discharge: 2018-03-18 | Disposition: A | Discharge status: Home / Self Care | Payer: Medicare HMO | Source: Ambulatory Visit | Attending: Radiation Oncology | Admitting: Radiation Oncology

## 2018-03-18 DIAGNOSIS — C61 Malignant neoplasm of prostate: Secondary | ICD-10-CM | POA: Diagnosis not present

## 2018-03-18 DIAGNOSIS — Z510 Encounter for antineoplastic radiation therapy: Secondary | ICD-10-CM | POA: Insufficient documentation

## 2018-03-19 ENCOUNTER — Ambulatory Visit
Admission: RE | Admit: 2018-03-19 | Discharge: 2018-03-19 | Disposition: A | Discharge status: Home / Self Care | Payer: Medicare HMO | Source: Ambulatory Visit | Attending: Radiation Oncology | Admitting: Radiation Oncology

## 2018-03-19 DIAGNOSIS — Z510 Encounter for antineoplastic radiation therapy: Secondary | ICD-10-CM | POA: Diagnosis not present

## 2018-03-19 DIAGNOSIS — C61 Malignant neoplasm of prostate: Secondary | ICD-10-CM | POA: Diagnosis not present

## 2018-03-20 ENCOUNTER — Ambulatory Visit
Admission: RE | Admit: 2018-03-20 | Discharge: 2018-03-20 | Disposition: A | Discharge status: Home / Self Care | Payer: Medicare HMO | Source: Ambulatory Visit | Attending: Radiation Oncology | Admitting: Radiation Oncology

## 2018-03-20 DIAGNOSIS — Z510 Encounter for antineoplastic radiation therapy: Secondary | ICD-10-CM | POA: Diagnosis not present

## 2018-03-20 DIAGNOSIS — C61 Malignant neoplasm of prostate: Secondary | ICD-10-CM | POA: Diagnosis not present

## 2018-03-21 ENCOUNTER — Ambulatory Visit
Admission: RE | Admit: 2018-03-21 | Discharge: 2018-03-21 | Disposition: A | Discharge status: Home / Self Care | Payer: Medicare HMO | Source: Ambulatory Visit | Attending: Radiation Oncology | Admitting: Radiation Oncology

## 2018-03-21 DIAGNOSIS — C61 Malignant neoplasm of prostate: Secondary | ICD-10-CM | POA: Diagnosis not present

## 2018-03-21 DIAGNOSIS — Z510 Encounter for antineoplastic radiation therapy: Secondary | ICD-10-CM | POA: Diagnosis not present

## 2018-03-24 ENCOUNTER — Encounter: Payer: Self-pay | Admitting: Radiation Oncology

## 2018-03-24 NOTE — Progress Notes (Signed)
  Radiation Oncology         [phone] ________________________________  Name: Jesse Pope MRN: [id number]  Date: 03/24/2018  DOB: 03/01/1951  End of Treatment Note  Diagnosis:   67 y.o.gentleman with Stage T1cadenocarcinoma of the prostate with Gleason Score of 4+3, and PSA of5.74     Indication for treatment:  Curative, Definitive Radiotherapy       Radiation treatment dates:   02/11/18 - 03/21/18  Site/dose:   The prostate was treated to 70 Gy in 28 fractions of 2.5 Gy  Beams/energy:   The patient was treated with IMRT using volumetric arc therapy delivering 6 MV X-rays to clockwise and counterclockwise circumferential arcs with a 90 degree collimator offset to avoid dose scalloping.  Image guidance was performed with daily cone beam CT prior to each fraction to align to gold markers in the prostate and assure proper bladder and rectal fill volumes.  Immobilization was achieved with BodyFix custom mold.  Narrative: The patient tolerated radiation treatment relatively well.   He experienced some minor urinary irritation and modest fatigue.  He denied pain, dysuria or hematuria, diarrhea, constipation, rectal bleeding, difficulty emptying his bladder, issues with bowel movements, hesitancy, straining or urgency.   He reported nocturia x3-4, moderate stream, and increasing mild to moderate fatigue throughout treatment.   Plan: The patient has completed radiation treatment. He will return to radiation oncology clinic for routine followup in one month. I advised him to call or return sooner if he has any questions or concerns related to his recovery or treatment. ________________________________  Sheral Apley. Tammi Klippel, M.D.  This document serves as a record of services personally performed by Tyler Pita, MD. It was created on his behalf by Wilburn Mylar, a trained medical scribe. The creation of this record is based on the scribe's personal observations and the provider's  statements to them. This document has been checked and approved by the attending provider.

## 2018-03-27 ENCOUNTER — Encounter: Payer: Self-pay | Admitting: Medical Oncology

## 2018-04-22 ENCOUNTER — Other Ambulatory Visit: Payer: Self-pay

## 2018-04-22 ENCOUNTER — Ambulatory Visit
Admission: RE | Admit: 2018-04-22 | Discharge: 2018-04-22 | Disposition: A | Discharge status: Home / Self Care | Payer: Medicare HMO | Source: Ambulatory Visit | Attending: Urology | Admitting: Urology

## 2018-04-22 ENCOUNTER — Encounter: Payer: Self-pay | Admitting: Urology

## 2018-04-22 VITALS — BP 141/80 | HR 73 | Temp 98.6°F | Resp 20 | Ht 70.0 in | Wt 167.6 lb

## 2018-04-22 DIAGNOSIS — Z79899 Other long term (current) drug therapy: Secondary | ICD-10-CM | POA: Diagnosis not present

## 2018-04-22 DIAGNOSIS — Z880 Allergy status to penicillin: Secondary | ICD-10-CM | POA: Diagnosis not present

## 2018-04-22 DIAGNOSIS — C44722 Squamous cell carcinoma of skin of right lower limb, including hip: Secondary | ICD-10-CM

## 2018-04-22 DIAGNOSIS — C61 Malignant neoplasm of prostate: Secondary | ICD-10-CM | POA: Insufficient documentation

## 2018-04-22 DIAGNOSIS — Z923 Personal history of irradiation: Secondary | ICD-10-CM | POA: Insufficient documentation

## 2018-04-22 DIAGNOSIS — M1990 Unspecified osteoarthritis, unspecified site: Secondary | ICD-10-CM | POA: Insufficient documentation

## 2018-04-22 NOTE — Addendum Note (Signed)
Encounter addended by: Malena Edman, RN on: 04/22/2018 3:25 PM  Actions taken: Charge Capture section accepted

## 2018-04-22 NOTE — Progress Notes (Signed)
Radiation Oncology         (336) [phone] ________________________________  Name: Jesse Pope MRN: [id number]  Date: 04/22/2018  DOB: 1950/07/25  Post Treatment Note  CC: Lawerance Cruel, MD  Lucas Mallow, MD  Diagnosis:   67 y.o.gentleman with Stage T1cadenocarcinoma of the prostate with Gleason Score of 4+3, and PSA of5.74  Interval Since Last Radiation:  4 weeks  02/11/18 - 03/21/18:  The prostate was treated to 70 Gy in 28 fractions of 2.5 Gy  Narrative:  The patient returns today for routine follow-up.  He tolerated radiation treatment relatively well.   He experienced some minor urinary irritation and modest fatigue.  He denied pain, dysuria or hematuria, diarrhea, constipation, rectal bleeding, difficulty emptying his bladder, issues with bowel movements, hesitancy, straining or urgency.   He reported nocturia x3-4, moderate stream, and increasing mild to moderate fatigue throughout treatment.                               On review of systems, the patient states that he is doing very well overall.  He reports complete resolution of his irritative urinary side effects with a current IPSS score of 4.  He reports nocturia x3 per night but does not wake with the urge to void and instead is awakened and due to osteoarthritis pains.  He specifically denies dysuria, gross hematuria, excessive daytime frequency, urgency, weak stream, incomplete emptying or incontinence.  He reports a healthy appetite and is maintaining his weight.  He denies abdominal pain, nausea, vomiting, diarrhea or constipation.  He reports a good energy level and has been able to remain active.  He has a large squamous cell carcinoma lesion on the right lower extremity which has remained unchanged.  He has not yet seen Dr. Marla Roe in plastic surgery.  ALLERGIES:  is allergic to penicillins.  Meds: Current Outpatient Medications  Medication Sig Dispense Refill  . amLODipine (NORVASC) 10 MG tablet Take  10 mg by mouth daily.    . methocarbamol (ROBAXIN) 500 MG tablet Take 1 tablet (500 mg total) by mouth every 6 (six) hours as needed for muscle spasms. 30 tablet 5  . naproxen sodium (ANAPROX) 220 MG tablet Take 220 mg by mouth every morning.    . ramipril (ALTACE) 10 MG capsule Take 10 mg by mouth daily.    . tamsulosin (FLOMAX) 0.4 MG CAPS capsule Take 1 capsule (0.4 mg total) by mouth daily after supper. (Patient not taking: Reported on 04/22/2018) 30 capsule 5   No current facility-administered medications for this encounter.     Physical Findings:  height is 5\' 10"  (1.778 m) and weight is 167 lb 9.6 oz (76 kg). His oral temperature is 98.6 F (37 C). His blood pressure is 141/80 (abnormal) and his pulse is 73. His respiration is 20 and oxygen saturation is 100%.  Pain Assessment Pain Score: 0-No pain/10 In general this is a well appearing Caucasian male in no acute distress.  He's alert and oriented x4 and appropriate throughout the examination. Cardiopulmonary assessment is negative for acute distress and he exhibits normal effort.  On the right lower shin there remains a very large, fungating squamous cell carcinoma encompassing the majority of the anterior right lower leg/shin. There is no bleeding or discharge noted.  Lab Findings: Lab Results  Component Value Date   WBC 5.5 09/09/2015   HGB 12.3 (L) 09/09/2015   HCT  35.7 (L) 09/09/2015   MCV 93.5 09/09/2015   PLT 247 09/09/2015     Radiographic Findings: No results found.  Impression/Plan: 1. 67 y.o.gentleman with Stage T1cadenocarcinoma of the prostate with Gleason Score of 4+3, and PSA of5.74.   He will continue to follow up with urology for ongoing PSA determinations and has an appointment scheduled with Dr. Gloriann Loan in January 2020. He understands what to expect with regards to PSA monitoring going forward. I will look forward to following his response to treatment via correspondence with urology, and would be happy to  continue to participate in his care if clinically indicated. I talked to the patient about what to expect in the future, including his risk for erectile dysfunction and rectal bleeding. I encouraged him to call or return to the office if he has any questions regarding his previous radiation or possible radiation side effects. He was comfortable with this plan and will follow up as needed. 2. Large, fungating SCC on the RLE.  Dr. Tammi Klippel has discussed this case with Dr. Ramond Craver surgeon who has agreed to see this patient in consult.  We will make this referral now that he has completed his prostate radiotherapy.    Nicholos Johns, PA-C

## 2018-04-24 ENCOUNTER — Telehealth: Payer: Self-pay | Admitting: *Deleted

## 2018-04-24 NOTE — Telephone Encounter (Signed)
CALLED PATIENT TO INFORM OF CONSULT WITH DR. Marla Roe  ON 05-02-18 @ 8 AM, LVM FOR A RETURN CALL

## 2018-05-02 ENCOUNTER — Ambulatory Visit: Payer: Medicare HMO | Admitting: Plastic Surgery

## 2018-05-02 ENCOUNTER — Encounter: Payer: Self-pay | Admitting: Plastic Surgery

## 2018-05-02 VITALS — BP 130/60 | HR 72 | Resp 14 | Ht 71.0 in | Wt 165.0 lb

## 2018-05-02 DIAGNOSIS — I10 Essential (primary) hypertension: Secondary | ICD-10-CM | POA: Diagnosis not present

## 2018-05-02 DIAGNOSIS — C44722 Squamous cell carcinoma of skin of right lower limb, including hip: Secondary | ICD-10-CM | POA: Diagnosis not present

## 2018-05-02 DIAGNOSIS — F17210 Nicotine dependence, cigarettes, uncomplicated: Secondary | ICD-10-CM

## 2018-05-02 DIAGNOSIS — C61 Malignant neoplasm of prostate: Secondary | ICD-10-CM | POA: Diagnosis not present

## 2018-05-02 NOTE — Progress Notes (Signed)
Patient ID: Jesse Pope, male    DOB: Dec 07, 1950, 67 y.o.   MRN: [id number]   Chief Complaint  Patient presents with  . Breast Problem    Patient is a 67 year old white male here for consultation for bilateral leg wounds.  He is currently being treated for prostate cancer and has been receiving radiation.  At least a year ago he had a biopsy of his right lower leg and it showed squamous cell carcinoma.  He notes that he has had the lesions on the right leg for approximately 7 years.  Over the past year he has noted lesions on the left leg.  He smokes at least one to 2 cigars/day.  He has hypertension and history of MRSA in the left knee.  He had back surgery and surgery on his heel.  The lesions of the right leg involves most of the anterior shin.  He has several lesions scattered on the left leg.  They are raised, scaly and friable.  Patient states that sometimes they even fall off.  At the moment they do not appear to be infected.  He does have swelling and edema in both legs with the right leg worse than the left.  The patient has a 1 x 1 cm wound in the left nostril area on the columella.  He states that it gets scabby and he picks at it and it bleeds.  He has had it for at least a year.  Nothing makes it better.   Review of Systems  Constitutional: Negative for activity change and appetite change.  HENT: Negative.   Eyes: Negative.   Respiratory: Negative.  Negative for shortness of breath.   Cardiovascular: Positive for leg swelling.  Genitourinary: Negative.   Musculoskeletal: Negative.   Skin: Positive for color change.  Neurological: Negative.   Psychiatric/Behavioral: Negative.     Past Medical History:  Diagnosis Date  . Alcohol abuse 09/07/2015  . Basal cell carcinoma of right lower leg   . Cellulitis and abscess of leg 09/07/2015  . Cigarette smoker 02/11/2014  . Eczema 02/11/2014  . Hypertension   . Hyponatremia 02/08/2014  . MRSA (methicillin resistant  Staphylococcus aureus)   . Nocturia associated with benign prostatic hypertrophy 09/07/2015  . Prostate cancer ([place])   . Pulmonary nodules 09/10/2015    Past Surgical History:  Procedure Laterality Date  . BACK SURGERY    . Biospy of Right Lower Extremity  01/27/2014   SCC  . I&D EXTREMITY Left 02/10/2014   Procedure: IRRIGATION AND DEBRIDEMENT EXTREMITY/PRE PATELLA BURSA;  Surgeon: Marin Shutter, MD;  Location: [facility];  Service: Orthopedics;  Laterality: Left;  . I&D EXTREMITY Left 02/15/2014   Procedure: IRRIGATION AND DEBRIDEMENT EXTREMITY/DELAY CLOSURE OF LEFT KNEE;  Surgeon: Marin Shutter, MD;  Location: [facility];  Service: Orthopedics;  Laterality: Left;      Current Outpatient Medications:  .  amLODipine (NORVASC) 10 MG tablet, Take 10 mg by mouth daily., Disp: , Rfl:  .  methocarbamol (ROBAXIN) 500 MG tablet, Take 1 tablet (500 mg total) by mouth every 6 (six) hours as needed for muscle spasms., Disp: 30 tablet, Rfl: 5 .  naproxen sodium (ANAPROX) 220 MG tablet, Take 220 mg by mouth every morning., Disp: , Rfl:  .  ramipril (ALTACE) 10 MG capsule, Take 10 mg by mouth daily., Disp: , Rfl:  .  tamsulosin (FLOMAX) 0.4 MG CAPS capsule, Take 1 capsule (0.4 mg total) by mouth daily after  supper., Disp: 30 capsule, Rfl: 5   Objective:   Vitals:   05/02/18 1035  BP: 130/60  Pulse: 72  Resp: 14  SpO2: 96%    Physical Exam  Constitutional: He is oriented to person, place, and time. He appears well-developed and well-nourished.  HENT:  Head: Normocephalic and atraumatic.  Nose:    Eyes: Pupils are equal, round, and reactive to light. EOM are normal.  Cardiovascular: Normal rate.  Pulmonary/Chest: Effort normal. No respiratory distress.  Abdominal: Soft.  Musculoskeletal: He exhibits edema and tenderness.       Legs: Neurological: He is alert and oriented to person, place, and time.  Psychiatric: He has a normal mood and affect. His behavior is normal. Thought content normal.     Assessment & Plan:  Squamous cell carcinoma of right lower leg  Malignant neoplasm of prostate (HCC)  Essential hypertension  Cigarette smoker  We discussed that the patient will need imaging of the right lower extremity and vascular studies.  This will need to be done prior to any surgical intervention.  The plan will be for wide excision of the right lower extremity.  We will need to have negative margins prior to any definitive coverage. We will also need to do a biopsy of the left nose area.  [place], DO

## 2018-05-26 NOTE — Addendum Note (Signed)
Addended by: Wallace Going on: 05/26/2018 08:13 PM   Modules accepted: Orders

## 2018-05-30 ENCOUNTER — Other Ambulatory Visit: Payer: Self-pay | Admitting: Physician Assistant

## 2018-05-30 DIAGNOSIS — C44722 Squamous cell carcinoma of skin of right lower limb, including hip: Secondary | ICD-10-CM

## 2018-06-06 ENCOUNTER — Ambulatory Visit (HOSPITAL_COMMUNITY)
Admission: RE | Admit: 2018-06-06 | Discharge: 2018-06-06 | Disposition: A | Discharge status: Home / Self Care | Payer: Medicare HMO | Source: Ambulatory Visit | Attending: Physician Assistant | Admitting: Physician Assistant

## 2018-06-06 DIAGNOSIS — C44722 Squamous cell carcinoma of skin of right lower limb, including hip: Secondary | ICD-10-CM | POA: Diagnosis not present

## 2018-06-09 ENCOUNTER — Other Ambulatory Visit: Payer: Self-pay | Admitting: Plastic Surgery

## 2018-06-09 ENCOUNTER — Ambulatory Visit (HOSPITAL_COMMUNITY)
Admission: RE | Admit: 2018-06-09 | Discharge: 2018-06-09 | Disposition: A | Discharge status: Home / Self Care | Payer: Medicare HMO | Source: Ambulatory Visit | Attending: Family Medicine | Admitting: Family Medicine

## 2018-06-09 ENCOUNTER — Encounter (HOSPITAL_COMMUNITY): Payer: Self-pay

## 2018-06-09 DIAGNOSIS — Z01818 Encounter for other preprocedural examination: Secondary | ICD-10-CM

## 2018-07-31 DIAGNOSIS — C61 Malignant neoplasm of prostate: Secondary | ICD-10-CM | POA: Diagnosis not present

## 2018-08-07 DIAGNOSIS — R351 Nocturia: Secondary | ICD-10-CM | POA: Diagnosis not present

## 2018-08-07 DIAGNOSIS — C61 Malignant neoplasm of prostate: Secondary | ICD-10-CM | POA: Diagnosis not present

## 2018-08-07 DIAGNOSIS — R350 Frequency of micturition: Secondary | ICD-10-CM | POA: Diagnosis not present

## 2018-08-13 DIAGNOSIS — L03119 Cellulitis of unspecified part of limb: Secondary | ICD-10-CM | POA: Diagnosis not present

## 2018-08-18 ENCOUNTER — Other Ambulatory Visit: Payer: Self-pay | Admitting: Radiation Oncology

## 2018-11-07 DIAGNOSIS — C61 Malignant neoplasm of prostate: Secondary | ICD-10-CM | POA: Diagnosis not present

## 2018-11-14 DIAGNOSIS — C61 Malignant neoplasm of prostate: Secondary | ICD-10-CM | POA: Diagnosis not present

## 2018-11-14 DIAGNOSIS — R351 Nocturia: Secondary | ICD-10-CM | POA: Diagnosis not present

## 2019-02-14 ENCOUNTER — Other Ambulatory Visit: Payer: Self-pay | Admitting: Radiation Oncology

## 2019-03-03 DIAGNOSIS — M5136 Other intervertebral disc degeneration, lumbar region: Secondary | ICD-10-CM | POA: Diagnosis not present

## 2019-03-03 DIAGNOSIS — J449 Chronic obstructive pulmonary disease, unspecified: Secondary | ICD-10-CM | POA: Diagnosis not present

## 2019-03-03 DIAGNOSIS — I10 Essential (primary) hypertension: Secondary | ICD-10-CM | POA: Diagnosis not present

## 2019-03-03 DIAGNOSIS — Z0000 Encounter for general adult medical examination without abnormal findings: Secondary | ICD-10-CM | POA: Diagnosis not present

## 2019-03-03 DIAGNOSIS — F1010 Alcohol abuse, uncomplicated: Secondary | ICD-10-CM | POA: Diagnosis not present

## 2019-03-03 DIAGNOSIS — C4490 Unspecified malignant neoplasm of skin, unspecified: Secondary | ICD-10-CM | POA: Diagnosis not present

## 2019-03-03 DIAGNOSIS — F17200 Nicotine dependence, unspecified, uncomplicated: Secondary | ICD-10-CM | POA: Diagnosis not present

## 2019-03-12 ENCOUNTER — Telehealth: Payer: Self-pay | Admitting: Hematology

## 2019-03-12 NOTE — Telephone Encounter (Signed)
Received a new hem referral from Dr. Myriam Jacobson for monocytosis. Jesse Pope has been cld and scheduled to see Dr. Maylon Peppers on 9/9 at 9am w/labs at 830am. Aware to arrive 15 minutes early.

## 2019-03-18 ENCOUNTER — Other Ambulatory Visit: Payer: Self-pay | Admitting: Hematology

## 2019-03-18 DIAGNOSIS — D72821 Monocytosis (symptomatic): Secondary | ICD-10-CM

## 2019-03-18 NOTE — Progress Notes (Signed)
[name]: Jesse Cruel, MD as PCP - General (Family Medicine)  HEME/ONC OVERVIEW: 1. Stage I [phone] adenocarcinoma of the prostate, GS 4+3 -01/2018 - 03/2018: prostate RT, 70 Gy/28 fx, Dr. Tammi Pope   2. Extensive squamous cell carcinoma of the skin, involving the R lower extremity -Followed by Dr. Marla Pope of plastic surgery   3. Borderline monocytosis  -02/2019: routine CBC showed WBC 6.4k w/ monocyte 14.2% (ULN 12.4%; abs 900) and basophil 2.5% (ULN 1%; abs 200); CBC otherwise completely normal   TREATMENT REGIMEN:  02/11/2018 - 03/21/2018: prostate RT, 70 Gy/28 fx, Dr. Tammi Pope   ASSESSMENT & PLAN:   Borderline monocytosis  -I reviewed the patient's records in detail, including PCP and radiation oncology clinic notes, lab studies and imaging results -In summary, patient presented to his PCP in 02/2019 for routine follow-up, and CBC showed an incidental, modestly elevated monocyte and basophil percentages, but WBC, Hgb and platelet count were completely normal.  Patient has a history of Stage I prostate adenocarcinoma s/p EBRT, completed in 03/2018.  In addition, he has extensive squamous of carcinoma of the skin involving the right lower extremity, for which he is being followed by plastic surgery. -I reviewed the lab studies in detail with the patient -We also discussed at length some of the common causes of monocytosis, including steroids, inflammatory/autoimmune conditions, and infection -In general, monocytosis is defined as absolute monocyte count > 1000, which the patient does not meet the criteria for -In addition, patient has extensive history of malignancy, including recently treated prostate cancer as well as extensive squamous of carcinoma of the skin, both of which can cause mild reactive monocytosis -Repeat CBC today showed essentially normal CBC, including Hgb, WBC and plt count; diff pending  -I personally  reviewed the patient's peripheral blood smear today.  The red blood cells were of normal morphology.  There was no schistocytosis.  The white blood cells were of normal morphology. There were no peripheral circulating blasts. The platelets were of normal size and I verified that there were no platelet clumping. -I have ordered CRP and ESR to assess inflammation (expecting to be high), as well as flow cytometry to rule out any monoclonal monocyte population and BCR/ALB to rule out CML (given the mildly elevated basophil percentage) -If the work-up above is negative for any hematologic malignancy, then the very mild relative monocytosis is most likely secondary to the underlying malignancy, and no further work-up is indicated at this time  Extensive squamous cell carcinoma of the skin, involving the R lower extremity, at least Stage III ([id number]) -Followed by Dr. Marla Pope of plastic surgery  -I reviewed the NCCN guideline in detail with the patient -As the patient has not had any previous surgical resection or radiation treatment, I discussed with the patient that those two approaches are generally preferred for localized disease, unless surgery and/or radiation would be not be curative, in which case systemic therapy can be considered -I have reached out to Dr. Marla Pope to discuss the treatment approach, and will also coordinate with radiation oncology as needed to determine the best approach   Hyperkalemia -Etiology unclear -K 5.6, patient is asymptomatic -I have prescribed Kayexalate 15g daily x 5 days, and will repeat labs on 03/30/2019 to monitor K level -Patient was also advised to reduce his dietary potassium intake   Tobacco cessation -Patient currently smokes 3-4 cigars day for many years -I counseled the patient on the importance of smoking cessation,  as tobacco abuse can cause impaired wound healing if he undergoes surgery  -Patient expressed understanding and agreed to reduce his  tobacco use   Orders Placed This Encounter  Procedures  . CBC with Differential (Cancer Center Only)    Standing Status:   Future    Standing Expiration Date:   04/28/2020  . CMP ([facility] only)    Standing Status:   Future    Standing Expiration Date:   04/28/2020  . Save Smear (SS[facility])    Standing Status:   Future    Standing Expiration Date:   03/24/2020  . Lactate dehydrogenase    Standing Status:   Future    Standing Expiration Date:   04/28/2020   All questions were answered. The patient knows to call the clinic with any problems, questions or concerns.  Return on 03/30/2019 for labs only. Return in ~4-5 weeks for labs and clinic appt.   Jesse Men, MD 03/25/2019 10:40 AM   CHIEF COMPLAINTS/PURPOSE OF CONSULTATION:  "I am feeling fine"  HISTORY OF PRESENTING ILLNESS:  Jesse Pope 68 y.o. male is here because of modestly elevated monocyte count.  Patient presented to his PCP in 02/2019 for routine follow-up, and CBC showed an incidental, modestly elevated monocyte and basophil percentages, but WBC, Hgb and platelet count were completely normal.  Patient has a history of Stage I prostate adenocarcinoma s/p EBRT, completed in 03/2018.  In addition, he has extensive squamous of carcinoma of the skin involving the right lower extremity, for which he is being followed by plastic surgery.  MEDICAL HISTORY:  Past Medical History:  Diagnosis Date  . Alcohol abuse 09/07/2015  . Basal cell carcinoma of right lower leg   . Cellulitis and abscess of leg 09/07/2015  . Cigarette smoker 02/11/2014  . Eczema 02/11/2014  . Hypertension   . Hyponatremia 02/08/2014  . MRSA (methicillin resistant Staphylococcus aureus)   . Nocturia associated with benign prostatic hypertrophy 09/07/2015  . Prostate cancer ([facility])   . Pulmonary nodules 09/10/2015    SURGICAL HISTORY: Past Surgical History:  Procedure Laterality Date  . BACK SURGERY    . Biospy of Right Lower Extremity  01/27/2014   SCC   . I&D EXTREMITY Left 02/10/2014   Procedure: IRRIGATION AND DEBRIDEMENT EXTREMITY/PRE PATELLA BURSA;  Surgeon: Marin Shutter, MD;  Location: [facility];  Service: Orthopedics;  Laterality: Left;  . I&D EXTREMITY Left 02/15/2014   Procedure: IRRIGATION AND DEBRIDEMENT EXTREMITY/DELAY CLOSURE OF LEFT KNEE;  Surgeon: Marin Shutter, MD;  Location: [facility];  Service: Orthopedics;  Laterality: Left;    SOCIAL HISTORY: Social History   Socioeconomic History  . Marital status: Married    Spouse name: Not on file  . Number of children: 3  . Years of education: Not on file  . Highest education level: Not on file  Occupational History  . Occupation: Development worker, community    Comment: limited hours  Social Needs  . Financial resource strain: Not on file  . Food insecurity    Worry: Not on file    Inability: Not on file  . Transportation needs    Medical: Not on file    Non-medical: Not on file  Tobacco Use  . Smoking status: Current Every Day Smoker    Types: Cigars  . Smokeless tobacco: Never Used  . Tobacco comment: uncertain of number of cigars smoked per day  Substance and Sexual Activity  . Alcohol use: Yes    Alcohol/week: 0.0 standard drinks  Comment: Drinks Alcohol daily  . Drug use: No  . Sexual activity: Not Currently  Lifestyle  . Physical activity    Days per week: Not on file    Minutes per session: Not on file  . Stress: Not on file  Relationships  . Social Herbalist on phone: Not on file    Gets together: Not on file    Attends religious service: Not on file    Active member of club or organization: Not on file    Attends meetings of clubs or organizations: Not on file    Relationship status: Not on file  . Intimate partner violence    Fear of current or ex partner: No    Emotionally abused: No    Physically abused: No    Forced sexual activity: No  Other Topics Concern  . Not on file  Social History Narrative   Patient resides in [place] independently. Patient  has two daughters and one son. Does not have any grandchildren yet. Works limited hours as a Development worker, community.    FAMILY HISTORY: Family History  Problem Relation Age of Onset  . Colon cancer Mother   . Crohn's disease Father   . Breast cancer Sister     ALLERGIES:  is allergic to penicillins.  MEDICATIONS:  Current Outpatient Medications  Medication Sig Dispense Refill  . diphenhydrAMINE (BENADRYL) 25 MG tablet Take 25 mg by mouth every morning.    Marland Kitchen amLODipine (NORVASC) 10 MG tablet Take 10 mg by mouth daily.    . methocarbamol (ROBAXIN) 500 MG tablet Take 1 tablet (500 mg total) by mouth every 6 (six) hours as needed for muscle spasms. 30 tablet 5  . naproxen sodium (ANAPROX) 220 MG tablet Take 220 mg by mouth every morning.    . ramipril (ALTACE) 10 MG capsule Take 10 mg by mouth daily.    . sodium polystyrene (KAYEXALATE) powder Take by mouth daily for 5 doses. 15g daily x 5 days 75 g 0   No current facility-administered medications for this visit.     REVIEW OF SYSTEMS:   Constitutional: ( - ) fevers, ( - )  chills , ( - ) night sweats Eyes: ( - ) blurriness of vision, ( - ) double vision, ( - ) watery eyes Ears, nose, mouth, throat, and face: ( - ) mucositis, ( - ) sore throat Respiratory: ( - ) cough, ( - ) dyspnea, ( - ) wheezes Cardiovascular: ( - ) palpitation, ( - ) chest discomfort, ( - ) lower extremity swelling Gastrointestinal:  ( - ) nausea, ( - ) heartburn, ( - ) change in bowel habits Skin: ( - ) abnormal skin rashes Lymphatics: ( - ) new lymphadenopathy, ( - ) easy bruising Neurological: ( - ) numbness, ( - ) tingling, ( - ) new weaknesses Behavioral/Psych: ( - ) mood change, ( - ) new changes  All other systems were reviewed with the patient and are negative.  PHYSICAL EXAMINATION: ECOG PERFORMANCE STATUS: 1 - Symptomatic but completely ambulatory  Vitals:   03/25/19 0917  BP: (!) 142/70  Pulse: 71  Resp: 20  Temp: 98.2 F (36.8 C)  SpO2: 99%   Filed  Weights   03/25/19 0917  Weight: 174 lb (78.9 kg)    GENERAL: alert, no distress and comfortable SKIN: multiple fungating masses over the right anterior shin, some with discloration, no bleeding or purulent drainage  EYES: conjunctiva are pink and non-injected, sclera clear OROPHARYNX:  no exudate, no erythema; lips, buccal mucosa, and tongue normal  NECK: supple, non-tender LUNGS: clear to auscultation with normal breathing effort HEART: regular rate & rhythm, no murmurs, no lower extremity edema ABDOMEN: soft, non-tender, non-distended, normal bowel sounds Musculoskeletal: no cyanosis of digits and no clubbing  PSYCH: alert & oriented x 3, fluent speech NEURO: no focal motor/sensory deficits  LABORATORY DATA:  I have reviewed the data as listed Lab Results  Component Value Date   WBC 6.7 03/25/2019   HGB 13.1 03/25/2019   HCT 38.6 (L) 03/25/2019   MCV 92.3 03/25/2019   PLT 283 03/25/2019   Lab Results  Component Value Date   NA 130 (L) 03/25/2019   K 5.6 (H) 03/25/2019   CL 97 (L) 03/25/2019   CO2 26 03/25/2019    RADIOGRAPHIC STUDIES: I have personally reviewed the radiological images as listed and agreed with the findings in the report.  PATHOLOGY: I have reviewed the pathology reports as documented in the oncologist history.

## 2019-03-25 ENCOUNTER — Encounter: Payer: Self-pay | Admitting: Hematology

## 2019-03-25 ENCOUNTER — Other Ambulatory Visit: Payer: Self-pay | Admitting: Hematology

## 2019-03-25 ENCOUNTER — Other Ambulatory Visit: Payer: Self-pay

## 2019-03-25 ENCOUNTER — Telehealth: Payer: Self-pay | Admitting: Hematology

## 2019-03-25 ENCOUNTER — Inpatient Hospital Stay: Payer: Medicare HMO

## 2019-03-25 ENCOUNTER — Inpatient Hospital Stay: Payer: Medicare HMO | Attending: Hematology | Admitting: Hematology

## 2019-03-25 ENCOUNTER — Telehealth: Payer: Self-pay | Admitting: *Deleted

## 2019-03-25 VITALS — BP 142/70 | HR 71 | Temp 98.2°F | Resp 20 | Ht 71.0 in | Wt 174.0 lb

## 2019-03-25 DIAGNOSIS — C61 Malignant neoplasm of prostate: Secondary | ICD-10-CM | POA: Diagnosis not present

## 2019-03-25 DIAGNOSIS — Z720 Tobacco use: Secondary | ICD-10-CM

## 2019-03-25 DIAGNOSIS — C44722 Squamous cell carcinoma of skin of right lower limb, including hip: Secondary | ICD-10-CM

## 2019-03-25 DIAGNOSIS — E875 Hyperkalemia: Secondary | ICD-10-CM

## 2019-03-25 DIAGNOSIS — Z79899 Other long term (current) drug therapy: Secondary | ICD-10-CM | POA: Diagnosis not present

## 2019-03-25 DIAGNOSIS — D72821 Monocytosis (symptomatic): Secondary | ICD-10-CM

## 2019-03-25 DIAGNOSIS — Z800 Family history of malignant neoplasm of digestive organs: Secondary | ICD-10-CM | POA: Insufficient documentation

## 2019-03-25 DIAGNOSIS — Z880 Allergy status to penicillin: Secondary | ICD-10-CM | POA: Diagnosis not present

## 2019-03-25 DIAGNOSIS — Z803 Family history of malignant neoplasm of breast: Secondary | ICD-10-CM | POA: Diagnosis not present

## 2019-03-25 DIAGNOSIS — F17290 Nicotine dependence, other tobacco product, uncomplicated: Secondary | ICD-10-CM | POA: Diagnosis not present

## 2019-03-25 LAB — CBC WITH DIFFERENTIAL (CANCER CENTER ONLY)
Basophils Absolute: 0.1 10*3/uL (ref 0.0–0.1)
Basophils Relative: 2 %
Eosinophils Absolute: 0.2 10*3/uL (ref 0.0–0.5)
Eosinophils Relative: 3 %
HCT: 38.6 % — ABNORMAL LOW (ref 39.0–52.0)
Hemoglobin: 13.1 g/dL (ref 13.0–17.0)
Lymphocytes Relative: 17 %
Lymphs Abs: 1.1 10*3/uL (ref 0.7–4.0)
MCH: 31.3 pg (ref 26.0–34.0)
MCHC: 33.9 g/dL (ref 30.0–36.0)
MCV: 92.3 fL (ref 80.0–100.0)
Monocytes Absolute: 1 10*3/uL (ref 0.1–1.0)
Monocytes Relative: 16 %
Neutro Abs: 4.2 10*3/uL (ref 1.7–7.7)
Neutrophils Relative %: 63 %
Platelet Count: 283 10*3/uL (ref 150–400)
RBC: 4.18 MIL/uL — ABNORMAL LOW (ref 4.22–5.81)
RDW: 13.2 % (ref 11.5–15.5)
WBC Count: 6.7 10*3/uL (ref 4.0–10.5)
nRBC: 0 % (ref 0.0–0.2)

## 2019-03-25 LAB — LACTATE DEHYDROGENASE: LDH: 205 U/L — ABNORMAL HIGH (ref 98–192)

## 2019-03-25 LAB — CMP (CANCER CENTER ONLY)
ALT: 12 U/L (ref 0–44)
AST: 19 U/L (ref 15–41)
Albumin: 4.1 g/dL (ref 3.5–5.0)
Alkaline Phosphatase: 53 U/L (ref 38–126)
Anion gap: 7 (ref 5–15)
BUN: 10 mg/dL (ref 8–23)
CO2: 26 mmol/L (ref 22–32)
Calcium: 9.1 mg/dL (ref 8.9–10.3)
Chloride: 97 mmol/L — ABNORMAL LOW (ref 98–111)
Creatinine: 1.06 mg/dL (ref 0.61–1.24)
GFR, Est AFR Am: >60 mL/min (ref >60)
GFR, Est Non Af Am: >60 mL/min (ref >60)
Glucose, Bld: 92 mg/dL (ref 70–99)
Potassium: 5.6 mmol/L — ABNORMAL HIGH (ref 3.5–5.1)
Sodium: 130 mmol/L — ABNORMAL LOW (ref 135–145)
Total Bilirubin: 1 mg/dL (ref 0.3–1.2)
Total Protein: 7.3 g/dL (ref 6.5–8.1)

## 2019-03-25 LAB — SAVE SMEAR(SSMR), FOR PROVIDER SLIDE REVIEW

## 2019-03-25 LAB — C-REACTIVE PROTEIN: CRP: <0.8 mg/dL (ref <1.0)

## 2019-03-25 LAB — SEDIMENTATION RATE: Sed Rate: 4 mm/hr (ref 0–16)

## 2019-03-25 MED ORDER — SODIUM POLYSTYRENE SULFONATE PO POWD: Freq: Every day | ORAL | 0 refills | Status: DC

## 2019-03-25 MED ORDER — SODIUM POLYSTYRENE SULFONATE 15 GM/60ML PO SUSP: 15.0000 g | Freq: Every day | ORAL | 0 refills | Status: DC

## 2019-03-25 NOTE — Telephone Encounter (Signed)
TCT patient regarding results from today's lab work. Unable to talk to patient but was able to leave vm message on his home phone as well as his cell #. Pt's K+ was 5.6. Dr. Maylon Peppers has sent in a prescription for Kayexalate for 5 days and repeat lab work on 03/30/19. Awaiting call back

## 2019-03-25 NOTE — Telephone Encounter (Signed)
Received call back from patient. TCT him and spoke with him. Advised that his K+ was 5.6 today. Dr. Maylon Peppers has ordered Kayexalate for him. Pt's pharmacy will have it for him tomorrow. Pt voiced understanding.  Pt also asked about getting a flu shot. Advised that it was fine for him to get flu shot. No other questions or concerns

## 2019-03-25 NOTE — Progress Notes (Unsigned)
kayex

## 2019-03-25 NOTE — Telephone Encounter (Signed)
Scheduled appt per 9/9 los- 10/16 not a day Dr. Maylon Peppers is in office - scheduled for a day pt can come in and Dr. Maylon Peppers in office.  Gave pt AVS and calender

## 2019-03-26 LAB — FLOW CYTOMETRY

## 2019-03-27 ENCOUNTER — Other Ambulatory Visit: Payer: Self-pay | Admitting: *Deleted

## 2019-03-27 DIAGNOSIS — E875 Hyperkalemia: Secondary | ICD-10-CM

## 2019-03-27 MED ORDER — SODIUM POLYSTYRENE SULFONATE 15 GM/60ML PO SUSP: 15.0000 g | Freq: Every day | ORAL | 0 refills | Status: AC

## 2019-03-27 NOTE — Telephone Encounter (Signed)
RN called [facility] to find out about filling kayexalate. They state it is on back order [facility] all [facility]. Suggested trying [facility] (or small private pharmacy).  Was able to get kayexalate at [facility]- Left message for patient with this information

## 2019-03-30 ENCOUNTER — Inpatient Hospital Stay: Payer: Medicare HMO

## 2019-03-30 ENCOUNTER — Telehealth: Payer: Self-pay | Admitting: *Deleted

## 2019-03-30 ENCOUNTER — Other Ambulatory Visit: Payer: Self-pay

## 2019-03-30 DIAGNOSIS — Z880 Allergy status to penicillin: Secondary | ICD-10-CM | POA: Diagnosis not present

## 2019-03-30 DIAGNOSIS — F17290 Nicotine dependence, other tobacco product, uncomplicated: Secondary | ICD-10-CM | POA: Diagnosis not present

## 2019-03-30 DIAGNOSIS — E875 Hyperkalemia: Secondary | ICD-10-CM | POA: Diagnosis not present

## 2019-03-30 DIAGNOSIS — Z800 Family history of malignant neoplasm of digestive organs: Secondary | ICD-10-CM | POA: Diagnosis not present

## 2019-03-30 DIAGNOSIS — C44722 Squamous cell carcinoma of skin of right lower limb, including hip: Secondary | ICD-10-CM | POA: Diagnosis not present

## 2019-03-30 DIAGNOSIS — Z803 Family history of malignant neoplasm of breast: Secondary | ICD-10-CM | POA: Diagnosis not present

## 2019-03-30 DIAGNOSIS — D72821 Monocytosis (symptomatic): Secondary | ICD-10-CM | POA: Diagnosis not present

## 2019-03-30 DIAGNOSIS — Z79899 Other long term (current) drug therapy: Secondary | ICD-10-CM | POA: Diagnosis not present

## 2019-03-30 DIAGNOSIS — C61 Malignant neoplasm of prostate: Secondary | ICD-10-CM | POA: Diagnosis not present

## 2019-03-30 LAB — CMP (CANCER CENTER ONLY)
ALT: 12 U/L (ref 0–44)
AST: 16 U/L (ref 15–41)
Albumin: 3.9 g/dL (ref 3.5–5.0)
Alkaline Phosphatase: 61 U/L (ref 38–126)
Anion gap: 6 (ref 5–15)
BUN: 9 mg/dL (ref 8–23)
CO2: 27 mmol/L (ref 22–32)
Calcium: 8.7 mg/dL — ABNORMAL LOW (ref 8.9–10.3)
Chloride: 98 mmol/L (ref 98–111)
Creatinine: 1.06 mg/dL (ref 0.61–1.24)
GFR, Est AFR Am: >60 mL/min (ref >60)
GFR, Est Non Af Am: >60 mL/min (ref >60)
Glucose, Bld: 112 mg/dL — ABNORMAL HIGH (ref 70–99)
Potassium: 4.9 mmol/L (ref 3.5–5.1)
Sodium: 131 mmol/L — ABNORMAL LOW (ref 135–145)
Total Bilirubin: 1 mg/dL (ref 0.3–1.2)
Total Protein: 7 g/dL (ref 6.5–8.1)

## 2019-03-30 LAB — LACTATE DEHYDROGENASE: LDH: 139 U/L (ref 98–192)

## 2019-03-30 LAB — CBC WITH DIFFERENTIAL (CANCER CENTER ONLY)
Abs Immature Granulocytes: 0.04 10*3/uL (ref 0.00–0.07)
Basophils Absolute: 0.1 10*3/uL (ref 0.0–0.1)
Basophils Relative: 1 %
Eosinophils Absolute: 0.2 10*3/uL (ref 0.0–0.5)
Eosinophils Relative: 2 %
HCT: 37.7 % — ABNORMAL LOW (ref 39.0–52.0)
Hemoglobin: 12.6 g/dL — ABNORMAL LOW (ref 13.0–17.0)
Immature Granulocytes: 0 %
Lymphocytes Relative: 12 %
Lymphs Abs: 1.1 10*3/uL (ref 0.7–4.0)
MCH: 31.2 pg (ref 26.0–34.0)
MCHC: 33.4 g/dL (ref 30.0–36.0)
MCV: 93.3 fL (ref 80.0–100.0)
Monocytes Absolute: 1.2 10*3/uL — ABNORMAL HIGH (ref 0.1–1.0)
Monocytes Relative: 14 %
Neutro Abs: 6.3 10*3/uL (ref 1.7–7.7)
Neutrophils Relative %: 71 %
Platelet Count: 268 10*3/uL (ref 150–400)
RBC: 4.04 MIL/uL — ABNORMAL LOW (ref 4.22–5.81)
RDW: 13.2 % (ref 11.5–15.5)
WBC Count: 8.9 10*3/uL (ref 4.0–10.5)
nRBC: 0 % (ref 0.0–0.2)

## 2019-03-30 LAB — SAVE SMEAR (SSMR)

## 2019-03-30 NOTE — Telephone Encounter (Signed)
Jesse Pope [phone]) requested return call to answer questions.    "Has Dr. Maylon Peppers received today's K+ level?   Is there an interaction with Kayexalate and Doxycycline?   If K+ level is normal may I stop using Kayexalate? Do not want to take the last two days of Kayexalate today and tomorrow.  It's tearing me up, diarrhea, bloated, burping and gas.  Have to start Doxycycline 100 mg twice daily for ten days.  Cream is not working for areas, bumps with pus noticed today."  Advised to complete Kayexalate as ordered.  Unaware of interaction.  Contact your pharmacy to confirm.  Updating medication list.

## 2019-03-31 NOTE — Telephone Encounter (Signed)
Please let the patient know that he can stop kayexalate as his K has improved to 4.9. He needs to continue to monitor his potassium intake. There is no interaction between doxycyline and Kayexalate.   He should continue follow-up with his PCP regarding his K level and doxycycline management for possible cellulitis.   Thanks.  Dr. Arnell Sieving

## 2019-04-01 NOTE — Telephone Encounter (Signed)
Provider instructions orders given to [employer].  Confirms "Stopped Kaexylate yesterday.  Maintaining F/U with PCP.  Doxycycline being used for a MRSA sore.  Don't know what I could have eaten make my potassium high.  I'll ask my daughter to help me monitor foods."  Reviewed foods high in potassium to assist with monitoring intake.   Denies further questions or needs.

## 2019-04-07 ENCOUNTER — Other Ambulatory Visit: Payer: Self-pay

## 2019-04-07 ENCOUNTER — Encounter: Payer: Self-pay | Admitting: Plastic Surgery

## 2019-04-07 ENCOUNTER — Ambulatory Visit: Payer: Medicare HMO | Admitting: Plastic Surgery

## 2019-04-07 VITALS — BP 138/89 | HR 80 | Temp 98.7°F | Ht 71.0 in | Wt 172.0 lb

## 2019-04-07 DIAGNOSIS — C44722 Squamous cell carcinoma of skin of right lower limb, including hip: Secondary | ICD-10-CM

## 2019-04-07 NOTE — Progress Notes (Signed)
Patient ID: Jesse Pope, male    DOB: [birth year], 68 y.o.   MRN: [id number]   Chief Complaint  Patient presents with  . Skin Problem    The patient is a 68 year old male here for follow-up on his leg wounds.  At some point he had a positive biopsy.  Is not clear if all the wounds and skin lesions are squamous cell or basal cell.  He has had a few infections in the right leg and been treated with either antibiotics or antibiotic ointment.  Right now he is stable.  I did send him to see vascular surgery for evaluation of his lower extremity blood flow.  The blood flow was found to be adequate.  The majority of his right anterior lower leg is involved and small portions of the left lower leg.  He smokes 2 cigars/day.  He has had elevated potassium in the past so has to watch his orange juice intake according to his hematologist.  He also has a history of prostate cancer.   Review of Systems  Constitutional: Negative for activity change and appetite change.  HENT: Negative.   Eyes: Negative.   Respiratory: Negative.  Negative for chest tightness and shortness of breath.   Cardiovascular: Positive for leg swelling.  Gastrointestinal: Negative.   Endocrine: Negative.   Genitourinary: Negative.   Musculoskeletal: Positive for gait problem.  Skin: Positive for color change and wound.  Psychiatric/Behavioral: Negative.     Past Medical History:  Diagnosis Date  . Alcohol abuse 09/07/2015  . Basal cell carcinoma of right lower leg   . Cellulitis and abscess of leg 09/07/2015  . Cigarette smoker 02/11/2014  . Eczema 02/11/2014  . Hypertension   . Hyponatremia 02/08/2014  . MRSA (methicillin resistant Staphylococcus aureus)   . Nocturia associated with benign prostatic hypertrophy 09/07/2015  . Prostate cancer ([facility])   . Pulmonary nodules 09/10/2015    Past Surgical History:  Procedure Laterality Date  . BACK SURGERY    . Biospy of Right Lower Extremity  01/27/2014   SCC  . I&D  EXTREMITY Left 02/10/2014   Procedure: IRRIGATION AND DEBRIDEMENT EXTREMITY/PRE PATELLA BURSA;  Surgeon: Marin Shutter, MD;  Location: [facility];  Service: Orthopedics;  Laterality: Left;  . I&D EXTREMITY Left 02/15/2014   Procedure: IRRIGATION AND DEBRIDEMENT EXTREMITY/DELAY CLOSURE OF LEFT KNEE;  Surgeon: Marin Shutter, MD;  Location: [facility];  Service: Orthopedics;  Laterality: Left;      Current Outpatient Medications:  .  amLODipine (NORVASC) 10 MG tablet, Take 10 mg by mouth daily., Disp: , Rfl:  .  diphenhydrAMINE (BENADRYL) 25 MG tablet, Take 25 mg by mouth every morning., Disp: , Rfl:  .  doxycycline (ADOXA) 100 MG tablet, Take 100 mg by mouth 2 (two) times daily as needed for rash. Take one pill twice daily x 10 days as needed., Disp: , Rfl:  .  methocarbamol (ROBAXIN) 500 MG tablet, Take 1 tablet (500 mg total) by mouth every 6 (six) hours as needed for muscle spasms., Disp: 30 tablet, Rfl: 5 .  mupirocin ointment (BACTROBAN) 2 %, Apply 1 application topically 3 (three) times daily as needed for rash. Apply to affected areas three tines daily x 10 days as neeeded, Disp: , Rfl:  .  naproxen sodium (ANAPROX) 220 MG tablet, Take 220 mg by mouth every morning., Disp: , Rfl:  .  ramipril (ALTACE) 10 MG capsule, Take 10 mg by mouth daily., Disp: , Rfl:  Objective:   Vitals:   04/07/19 0804  BP: 138/89  Pulse: 80  Temp: 98.7 F (37.1 C)  SpO2: 99%    Physical Exam Vitals signs and nursing note reviewed.  Constitutional:      Appearance: Normal appearance.  HENT:     Head: Normocephalic and atraumatic.  Neck:     Musculoskeletal: Normal range of motion.  Cardiovascular:     Rate and Rhythm: Normal rate.  Pulmonary:     Effort: Pulmonary effort is normal.  Musculoskeletal:       Legs:  Skin:    General: Skin is warm.  Neurological:     General: No focal deficit present.     Mental Status: He is alert and oriented to person, place, and time.  Psychiatric:        Mood and  Affect: Mood normal.        Behavior: Behavior normal.        Thought Content: Thought content normal.     Assessment & Plan:  Squamous cell carcinoma of right lower leg Changing skin lesions bilateral lower extremities  Recommend eliminating the nicotine in his daily routine.  Plan for excision of right leg skin lesions and placement of ACell. Recommend multivitamin daily.  Patient is planning on having lab work redone October 21.  We can shoot for surgery shortly after. Pictures were obtained of the patient and placed in the chart with the patient's or guardian's permission.  [place], DO

## 2019-04-22 LAB — BCR ABL1 FISH (GENPATH)

## 2019-05-06 ENCOUNTER — Other Ambulatory Visit: Payer: Self-pay

## 2019-05-06 ENCOUNTER — Inpatient Hospital Stay: Payer: Medicare HMO | Attending: Hematology | Admitting: Hematology

## 2019-05-06 ENCOUNTER — Inpatient Hospital Stay: Payer: Medicare HMO

## 2019-05-06 ENCOUNTER — Encounter: Payer: Self-pay | Admitting: Hematology

## 2019-05-06 ENCOUNTER — Telehealth: Payer: Self-pay | Admitting: Hematology

## 2019-05-06 VITALS — BP 136/76 | HR 71 | Temp 98.2°F | Resp 17 | Ht 71.0 in | Wt 174.1 lb

## 2019-05-06 DIAGNOSIS — C61 Malignant neoplasm of prostate: Secondary | ICD-10-CM | POA: Insufficient documentation

## 2019-05-06 DIAGNOSIS — D72821 Monocytosis (symptomatic): Secondary | ICD-10-CM

## 2019-05-06 DIAGNOSIS — Z79899 Other long term (current) drug therapy: Secondary | ICD-10-CM | POA: Insufficient documentation

## 2019-05-06 DIAGNOSIS — Z6824 Body mass index (BMI) 24.0-24.9, adult: Secondary | ICD-10-CM | POA: Insufficient documentation

## 2019-05-06 DIAGNOSIS — Z880 Allergy status to penicillin: Secondary | ICD-10-CM | POA: Insufficient documentation

## 2019-05-06 DIAGNOSIS — C44722 Squamous cell carcinoma of skin of right lower limb, including hip: Secondary | ICD-10-CM

## 2019-05-06 LAB — CBC WITH DIFFERENTIAL (CANCER CENTER ONLY)
Abs Immature Granulocytes: 0.03 10*3/uL (ref 0.00–0.07)
Basophils Absolute: 0.1 10*3/uL (ref 0.0–0.1)
Basophils Relative: 1 %
Eosinophils Absolute: 0.2 10*3/uL (ref 0.0–0.5)
Eosinophils Relative: 2 %
HCT: 40 % (ref 39.0–52.0)
Hemoglobin: 13.5 g/dL (ref 13.0–17.0)
Immature Granulocytes: 0 %
Lymphocytes Relative: 15 %
Lymphs Abs: 1.1 10*3/uL (ref 0.7–4.0)
MCH: 31 pg (ref 26.0–34.0)
MCHC: 33.8 g/dL (ref 30.0–36.0)
MCV: 92 fL (ref 80.0–100.0)
Monocytes Absolute: 1.1 10*3/uL — ABNORMAL HIGH (ref 0.1–1.0)
Monocytes Relative: 15 %
Neutro Abs: 4.6 10*3/uL (ref 1.7–7.7)
Neutrophils Relative %: 67 %
Platelet Count: 279 10*3/uL (ref 150–400)
RBC: 4.35 MIL/uL (ref 4.22–5.81)
RDW: 12.6 % (ref 11.5–15.5)
WBC Count: 7.1 10*3/uL (ref 4.0–10.5)
nRBC: 0 % (ref 0.0–0.2)

## 2019-05-06 LAB — CMP (CANCER CENTER ONLY)
ALT: 13 U/L (ref 0–44)
AST: 14 U/L — ABNORMAL LOW (ref 15–41)
Albumin: 4.1 g/dL (ref 3.5–5.0)
Alkaline Phosphatase: 55 U/L (ref 38–126)
Anion gap: 11 (ref 5–15)
BUN: 9 mg/dL (ref 8–23)
CO2: 23 mmol/L (ref 22–32)
Calcium: 9.1 mg/dL (ref 8.9–10.3)
Chloride: 97 mmol/L — ABNORMAL LOW (ref 98–111)
Creatinine: 0.98 mg/dL (ref 0.61–1.24)
GFR, Est AFR Am: >60 mL/min (ref >60)
GFR, Estimated: >60 mL/min (ref >60)
Glucose, Bld: 106 mg/dL — ABNORMAL HIGH (ref 70–99)
Potassium: 4.3 mmol/L (ref 3.5–5.1)
Sodium: 131 mmol/L — ABNORMAL LOW (ref 135–145)
Total Bilirubin: 1 mg/dL (ref 0.3–1.2)
Total Protein: 7.5 g/dL (ref 6.5–8.1)

## 2019-05-06 LAB — SAVE SMEAR(SSMR), FOR PROVIDER SLIDE REVIEW

## 2019-05-06 LAB — LACTATE DEHYDROGENASE: LDH: 147 U/L (ref 98–192)

## 2019-05-06 NOTE — Telephone Encounter (Signed)
Scheduled appt per 10/21 los.  Spoke with pt and he is aware of his appt date and time,

## 2019-05-06 NOTE — Progress Notes (Signed)
[facility] OFFICE PROGRESS NOTE  Patient Care Team: Lawerance Cruel, MD as PCP - General (Family Medicine)  HEME/ONC OVERVIEW: 1. Extensive squamous cell carcinoma of the skin, involving the R lower extremity -Followed by Dr. Marla Roe of plastic surgery   2. Stage I ([id number] adenocarcinoma of the prostate, GS 4+3 -01/2018 - 03/2018: prostate RT, 70 Gy/28 fx, Dr. Tammi Klippel  -On observation   3. Borderline monocytosis  -02/2019: WBC 6.4k w/ monocyte 14.2% (abs 900) and basophil 2.5% (abs 200); CBC otherwise completely normal   BCR/ABL FISH normal; ESR, CRP normal  TREATMENT REGIMEN:  02/11/2018 - 03/21/2018: prostate RT, 70 Gy/28 fx, Dr. Tammi Klippel   ASSESSMENT & PLAN:   Borderline monocytosis  -Most likely secondary to underlying malignancy, especially extensive SCCa of the skin  -Peripheral blood smear unremarkable  -Clinically, patient denies any symptoms of infection or constitutional symptoms  -Repeat CBC pending today  -In light of the extensive SCCa of the skin, we will monitor CBC periodically for now   Extensive squamous cell carcinoma of the skin, involving the R lower extremity, at least Stage III ([id number]) -I discussed the case with Dr. Marla Roe of plastic surgery, who is planning to pursue excision of the skin cancer on 07/23/2019 -I reviewed the NCCN guideline in detail with the patient  -Given the extensiveness of the skin cancer, he will likely need adjuvant RT +/- systemic therapy for palliative control  -Patient expressed understanding; we will see him in mid-07/2019 to discuss adjuvant treatment options   Orders Placed This Encounter  Procedures  . CBC with Differential (Cancer Center Only)    Standing Status:   Future    Number of Occurrences:   1    Standing Expiration Date:   06/09/2020  . CMP ([facility] only)    Standing Status:   Future    Number of Occurrences:   1    Standing Expiration Date:   06/09/2020  . Save Smear ([facility])     Standing Status:   Future    Number of Occurrences:   1    Standing Expiration Date:   05/05/2020  . Lactate dehydrogenase    Standing Status:   Future    Number of Occurrences:   1    Standing Expiration Date:   06/09/2020  . CBC with Differential (Cancer Center Only)    Standing Status:   Future    Standing Expiration Date:   06/09/2020  . CMP ([facility] only)    Standing Status:   Future    Standing Expiration Date:   06/09/2020  . Save Smear (SS[facility])    Standing Status:   Future    Standing Expiration Date:   05/05/2020  . Lactate dehydrogenase    Standing Status:   Future    Standing Expiration Date:   06/09/2020   All questions were answered. The patient knows to call the clinic with any problems, questions or concerns. No barriers to learning was detected.  A total of more than 25 minutes were spent face-to-face with the patient during this encounter and over half of that time was spent on counseling and coordination of care as outlined above.   Return in mid-07/2019 for labs, pathology results and clinic appt.   Tish Men, MD 05/06/2019 8:54 AM  CHIEF COMPLAINT: "I am doing fine"  INTERVAL HISTORY: Jesse Pope returns to clinic for follow-up of monocytosis and squamous of carcinoma of skin in the right lower extremity.  Patient reports that  he has been doing well since last visit.  He met with Dr. Marla Roe of plastic surgery, and has been scheduled for surgical resection of the squamous cell carcinoma of the skin in early 07/2018.  He is still working at [employer].  He denies any other complaint today.  REVIEW OF SYSTEMS:   Constitutional: ( - ) fevers, ( - )  chills , ( - ) night sweats Eyes: ( - ) blurriness of vision, ( - ) double vision, ( - ) watery eyes Ears, nose, mouth, throat, and face: ( - ) mucositis, ( - ) sore throat Respiratory: ( - ) cough, ( - ) dyspnea, ( - ) wheezes Cardiovascular: ( - ) palpitation, ( - ) chest discomfort, ( - ) lower  extremity swelling Gastrointestinal:  ( - ) nausea, ( - ) heartburn, ( - ) change in bowel habits Skin: ( + ) abnormal skin rashes Lymphatics: ( - ) new lymphadenopathy, ( - ) easy bruising Neurological: ( - ) numbness, ( - ) tingling, ( - ) new weaknesses Behavioral/Psych: ( - ) mood change, ( - ) new changes  All other systems were reviewed with the patient and are negative.  SUMMARY OF ONCOLOGIC HISTORY: Oncology History   No history exists.    I have reviewed the past medical history, past surgical history, social history and family history with the patient and they are unchanged from previous note.  ALLERGIES:  is allergic to penicillins.  MEDICATIONS:  Current Outpatient Medications  Medication Sig Dispense Refill  . amLODipine (NORVASC) 10 MG tablet Take 10 mg by mouth daily.    . diphenhydrAMINE (BENADRYL) 25 MG tablet Take 25 mg by mouth every morning.    Marland Kitchen doxycycline (ADOXA) 100 MG tablet Take 100 mg by mouth 2 (two) times daily as needed for rash. Take one pill twice daily x 10 days as needed.    . methocarbamol (ROBAXIN) 500 MG tablet Take 1 tablet (500 mg total) by mouth every 6 (six) hours as needed for muscle spasms. 30 tablet 5  . mupirocin ointment (BACTROBAN) 2 % Apply 1 application topically 3 (three) times daily as needed for rash. Apply to affected areas three tines daily x 10 days as neeeded    . naproxen sodium (ANAPROX) 220 MG tablet Take 220 mg by mouth every morning.    . ramipril (ALTACE) 10 MG capsule Take 10 mg by mouth daily.     No current facility-administered medications for this visit.     PHYSICAL EXAMINATION: ECOG PERFORMANCE STATUS: 1 - Symptomatic but completely ambulatory  Today's Vitals   05/06/19 0807  BP: 136/76  Pulse: 71  Resp: 17  Temp: 98.2 F (36.8 C)  TempSrc: Oral  SpO2: 99%  Weight: 174 lb 1.6 oz (79 kg)  Height: '5\' 11"'$  (1.803 m)   Body mass index is 24.28 kg/m.  Filed Weights   05/06/19 0807  Weight: 174 lb 1.6 oz  (79 kg)    GENERAL: alert, no distress and comfortable SKIN: extensive squamous cell carcinoma of the right lower extremity, including multiple exophytic lesions, no bleeding  EYES: conjunctiva are pink and non-injected, sclera clear OROPHARYNX: no exudate, no erythema; lips, buccal mucosa, and tongue normal  NECK: supple, non-tender LUNGS: clear to auscultation with normal breathing effort HEART: regular rate & rhythm and no murmurs and no lower extremity edema ABDOMEN: soft, non-tender, non-distended, normal bowel sounds Musculoskeletal: no cyanosis of digits and no clubbing  PSYCH: alert & oriented x  3, fluent speech NEURO: no focal motor/sensory deficits  LABORATORY DATA:  I have reviewed the data as listed    Component Value Date/Time   NA 131 (L) 03/30/2019 0957   K 4.9 03/30/2019 0957   CL 98 03/30/2019 0957   CO2 27 03/30/2019 0957   GLUCOSE 112 (H) 03/30/2019 0957   BUN 9 03/30/2019 0957   CREATININE 1.06 03/30/2019 0957   CALCIUM 8.7 (L) 03/30/2019 0957   PROT 7.0 03/30/2019 0957   ALBUMIN 3.9 03/30/2019 0957   AST 16 03/30/2019 0957   ALT 12 03/30/2019 0957   ALKPHOS 61 03/30/2019 0957   BILITOT 1.0 03/30/2019 0957   GFRNONAA >60 03/30/2019 0957   GFRAA >60 03/30/2019 0957    No results found for: SPEP, UPEP  Lab Results  Component Value Date   WBC 7.1 05/06/2019   NEUTROABS 4.6 05/06/2019   HGB 13.5 05/06/2019   HCT 40.0 05/06/2019   MCV 92.0 05/06/2019   PLT 279 05/06/2019      Chemistry      Component Value Date/Time   NA 131 (L) 03/30/2019 0957   K 4.9 03/30/2019 0957   CL 98 03/30/2019 0957   CO2 27 03/30/2019 0957   BUN 9 03/30/2019 0957   CREATININE 1.06 03/30/2019 0957      Component Value Date/Time   CALCIUM 8.7 (L) 03/30/2019 0957   ALKPHOS 61 03/30/2019 0957   AST 16 03/30/2019 0957   ALT 12 03/30/2019 0957   BILITOT 1.0 03/30/2019 0957

## 2019-05-08 ENCOUNTER — Emergency Department (HOSPITAL_COMMUNITY): Payer: Medicare HMO

## 2019-05-08 ENCOUNTER — Other Ambulatory Visit: Payer: Self-pay

## 2019-05-08 ENCOUNTER — Emergency Department (HOSPITAL_COMMUNITY)
Admission: EM | Admit: 2019-05-08 | Discharge: 2019-05-08 | Disposition: A | Discharge status: Home / Self Care | Payer: Medicare HMO | Attending: Emergency Medicine | Admitting: Emergency Medicine

## 2019-05-08 ENCOUNTER — Encounter (HOSPITAL_COMMUNITY): Payer: Self-pay | Admitting: Emergency Medicine

## 2019-05-08 DIAGNOSIS — W138XXA Fall from, out of or through other building or structure, initial encounter: Secondary | ICD-10-CM | POA: Insufficient documentation

## 2019-05-08 DIAGNOSIS — Z8546 Personal history of malignant neoplasm of prostate: Secondary | ICD-10-CM | POA: Insufficient documentation

## 2019-05-08 DIAGNOSIS — F17210 Nicotine dependence, cigarettes, uncomplicated: Secondary | ICD-10-CM | POA: Insufficient documentation

## 2019-05-08 DIAGNOSIS — Z79899 Other long term (current) drug therapy: Secondary | ICD-10-CM | POA: Insufficient documentation

## 2019-05-08 DIAGNOSIS — Y92012 Bathroom of single-family (private) house as the place of occurrence of the external cause: Secondary | ICD-10-CM | POA: Insufficient documentation

## 2019-05-08 DIAGNOSIS — Y990 Civilian activity done for income or pay: Secondary | ICD-10-CM | POA: Diagnosis not present

## 2019-05-08 DIAGNOSIS — S299XXA Unspecified injury of thorax, initial encounter: Secondary | ICD-10-CM | POA: Diagnosis not present

## 2019-05-08 DIAGNOSIS — R52 Pain, unspecified: Secondary | ICD-10-CM | POA: Diagnosis not present

## 2019-05-08 DIAGNOSIS — Y93H3 Activity, building and construction: Secondary | ICD-10-CM | POA: Diagnosis not present

## 2019-05-08 DIAGNOSIS — S42342A Displaced spiral fracture of shaft of humerus, left arm, initial encounter for closed fracture: Secondary | ICD-10-CM | POA: Diagnosis not present

## 2019-05-08 DIAGNOSIS — S42352A Displaced comminuted fracture of shaft of humerus, left arm, initial encounter for closed fracture: Secondary | ICD-10-CM

## 2019-05-08 DIAGNOSIS — R079 Chest pain, unspecified: Secondary | ICD-10-CM | POA: Diagnosis not present

## 2019-05-08 DIAGNOSIS — E871 Hypo-osmolality and hyponatremia: Secondary | ICD-10-CM | POA: Diagnosis not present

## 2019-05-08 DIAGNOSIS — I10 Essential (primary) hypertension: Secondary | ICD-10-CM | POA: Insufficient documentation

## 2019-05-08 DIAGNOSIS — S4992XA Unspecified injury of left shoulder and upper arm, initial encounter: Secondary | ICD-10-CM | POA: Diagnosis present

## 2019-05-08 DIAGNOSIS — W19XXXA Unspecified fall, initial encounter: Secondary | ICD-10-CM | POA: Diagnosis not present

## 2019-05-08 LAB — CBC
HCT: 33.8 % — ABNORMAL LOW (ref 39.0–52.0)
Hemoglobin: 11.3 g/dL — ABNORMAL LOW (ref 13.0–17.0)
MCH: 31.7 pg (ref 26.0–34.0)
MCHC: 33.4 g/dL (ref 30.0–36.0)
MCV: 94.7 fL (ref 80.0–100.0)
Platelets: 152 10*3/uL (ref 150–400)
RBC: 3.57 MIL/uL — ABNORMAL LOW (ref 4.22–5.81)
RDW: 12.8 % (ref 11.5–15.5)
WBC: 10.6 10*3/uL — ABNORMAL HIGH (ref 4.0–10.5)
nRBC: 0 % (ref 0.0–0.2)

## 2019-05-08 LAB — BASIC METABOLIC PANEL
Anion gap: 6 (ref 5–15)
BUN: 11 mg/dL (ref 8–23)
CO2: 21 mmol/L — ABNORMAL LOW (ref 22–32)
Calcium: 7.1 mg/dL — ABNORMAL LOW (ref 8.9–10.3)
Chloride: 99 mmol/L (ref 98–111)
Creatinine, Ser: 0.95 mg/dL (ref 0.61–1.24)
GFR calc Af Amer: >60 mL/min (ref >60)
GFR calc non Af Amer: >60 mL/min (ref >60)
Glucose, Bld: 110 mg/dL — ABNORMAL HIGH (ref 70–99)
Potassium: 4 mmol/L (ref 3.5–5.1)
Sodium: 126 mmol/L — ABNORMAL LOW (ref 135–145)

## 2019-05-08 MED ORDER — HYDROMORPHONE HCL 1 MG/ML IJ SOLN
1.0000 mg | Freq: Once | INTRAMUSCULAR | Status: AC
  Administered 2019-05-08: 1 mg via INTRAVENOUS
  Filled 2019-05-08: qty 1

## 2019-05-08 MED ORDER — HYDROCODONE-ACETAMINOPHEN 5-325 MG PO TABS: 1.0000 | ORAL_TABLET | Freq: Four times a day (QID) | ORAL | 0 refills | Status: DC | PRN

## 2019-05-08 NOTE — ED Triage Notes (Signed)
Patient arrived by EMS from work. Patient fell while working in bathroom and fell through the floor. Pt c/o LFT upper arm pain w/ deformity per EMS.   Pt denies LOC and denies hitting head.   Pt denies being on blood thinners.   Ems gave 150 mcg of Fentanyl.

## 2019-05-08 NOTE — Discharge Instructions (Signed)
Keep the splint and sling on.  Follow-up with Dr. Stann Mainland.  Call the office to schedule an appointment. return to the ED for numbness weakness or other concerning symptoms

## 2019-05-08 NOTE — ED Provider Notes (Signed)
[facility] DEPT Provider Note   CSN: [id number] Arrival date & time: 05/08/19  0935     History   Chief Complaint Chief Complaint  Patient presents with   Shoulder Pain    HPI Jesse Pope is a 68 y.o. male.     HPI Patient presents to the ED for evaluation of a have a left arm injury.  Patient was working when he was standing on a board.  The board broke and the patient fell through a floor..  Patient states he only fell about 3 feet but his arm got caught up in the floor and was twisted.  Patient is complaining of pain in his left arm.  He denies any other injuries.  No headache.  No neck pain.  No numbness or weakness. Past Medical History:  Diagnosis Date   Alcohol abuse 09/07/2015   Basal cell carcinoma of right lower leg    Cellulitis and abscess of leg 09/07/2015   Cigarette smoker 02/11/2014   Eczema 02/11/2014   Hypertension    Hyponatremia 02/08/2014   MRSA (methicillin resistant Staphylococcus aureus)    Nocturia associated with benign prostatic hypertrophy 09/07/2015   Prostate cancer [facility])    Pulmonary nodules 09/10/2015    Patient Active Problem List   Diagnosis Date Noted   Monocytosis 03/18/2019   Malignant neoplasm of prostate ([place]) 12/26/2017   Pulmonary nodules 09/10/2015   Alcohol abuse 09/07/2015   Nocturia associated with benign prostatic hypertrophy 09/07/2015   HTN (hypertension) 02/11/2014   Tobacco abuse 02/11/2014   Eczema 02/11/2014   Squamous cell carcinoma of right lower leg 02/11/2014   Hyponatremia 02/08/2014    Past Surgical History:  Procedure Laterality Date   BACK SURGERY     Biospy of Right Lower Extremity  01/27/2014   SCC   I&D EXTREMITY Left 02/10/2014   Procedure: IRRIGATION AND DEBRIDEMENT EXTREMITY/PRE PATELLA BURSA;  Surgeon: Marin Shutter, MD;  Location: [facility];  Service: Orthopedics;  Laterality: Left;   I&D EXTREMITY Left 02/15/2014   Procedure: IRRIGATION AND  DEBRIDEMENT EXTREMITY/DELAY CLOSURE OF LEFT KNEE;  Surgeon: Marin Shutter, MD;  Location: [facility];  Service: Orthopedics;  Laterality: Left;        Home Medications    Prior to Admission medications   Medication Sig Start Date End Date Taking? Authorizing Provider  amLODipine (NORVASC) 10 MG tablet Take 10 mg by mouth daily.   Yes [provider]  diphenhydrAMINE (BENADRYL) 25 MG tablet Take 25 mg by mouth every morning.   Yes [provider]  loperamide (IMODIUM) 1 MG/5ML solution Take 4 mg by mouth as needed for diarrhea or loose stools.   Yes [provider]  methocarbamol (ROBAXIN) 500 MG tablet Take 1 tablet (500 mg total) by mouth every 6 (six) hours as needed for muscle spasms. 02/14/18  Yes Tyler Pita, MD  ramipril (ALTACE) 10 MG capsule Take 10 mg by mouth daily.   Yes [provider]  HYDROcodone-acetaminophen (NORCO/VICODIN) 5-325 MG tablet Take 1 tablet by mouth every 6 (six) hours as needed. 05/08/19   Dorie Rank, MD    Family History Family History  Problem Relation Age of Onset   Colon cancer Mother    Crohn's disease Father    Breast cancer Sister     Social History Social History   Tobacco Use   Smoking status: Current Every Day Smoker    Types: Cigars   Smokeless tobacco: Never Used   Tobacco  comment: uncertain of number of cigars smoked per day  Substance Use Topics   Alcohol use: Yes    Alcohol/week: 0.0 standard drinks    Comment: Drinks Alcohol daily   Drug use: No     Allergies   Penicillins   Review of Systems Review of Systems  All other systems reviewed and are negative.    Physical Exam Updated Vital Signs BP 128/81 (BP Location: Right Arm)    Pulse 66    Temp 97.8 F (36.6 C) (Oral)    Resp 17    Ht 1.778 m (5\' 10" )    Wt 77.1 kg    SpO2 96%    BMI 24.39 kg/m   Physical Exam Vitals signs and nursing note reviewed.  Constitutional:      General: He is not in acute distress.     Appearance: Normal appearance. He is well-developed. He is not diaphoretic.  HENT:     Head: Normocephalic and atraumatic. No raccoon eyes or Battle's sign.     Right Ear: External ear normal.     Left Ear: External ear normal.  Eyes:     General: Lids are normal.        Right eye: No discharge.     Conjunctiva/sclera:     Right eye: No hemorrhage.    Left eye: No hemorrhage. Neck:     Musculoskeletal: No edema or spinous process tenderness.     Trachea: No tracheal deviation.  Cardiovascular:     Rate and Rhythm: Normal rate and regular rhythm.     Heart sounds: Normal heart sounds.  Pulmonary:     Effort: Pulmonary effort is normal. No respiratory distress.     Breath sounds: Normal breath sounds. No stridor.  Chest:     Chest wall: No deformity, tenderness or crepitus.  Abdominal:     General: Bowel sounds are normal. There is no distension.     Palpations: Abdomen is soft. There is no mass.     Tenderness: There is no abdominal tenderness.     Comments:    Musculoskeletal:     Left elbow: Normal.     Cervical back: He exhibits no tenderness, no swelling and no deformity.     Thoracic back: He exhibits no tenderness, no swelling and no deformity.     Lumbar back: He exhibits no tenderness and no swelling.     Left upper arm: He exhibits tenderness, bony tenderness, swelling and deformity.     Comments: Pelvis stable, no ttp; normal strength and sensation left hand, no forearm tenderness, no clavicle tenderness  Neurological:     Mental Status: He is alert.     GCS: GCS eye subscore is 4. GCS verbal subscore is 5. GCS motor subscore is 6.     Sensory: No sensory deficit.     Motor: No abnormal muscle tone.     Comments: Able to move all extremities, sensation intact throughout  Psychiatric:        Speech: Speech normal.        Behavior: Behavior normal.      ED Treatments / Results  Labs (all labs ordered are listed, but only abnormal results are displayed) Labs  Reviewed  CBC - Abnormal; Notable for the following components:      Result Value   WBC 10.6 (*)    RBC 3.57 (*)    Hemoglobin 11.3 (*)    HCT 33.8 (*)    All other components within normal  limits  BASIC METABOLIC PANEL - Abnormal; Notable for the following components:   Sodium 126 (*)    CO2 21 (*)    Glucose, Bld 110 (*)    Calcium 7.1 (*)    All other components within normal limits    EKG None  Radiology Dg Chest Portable 1 View  Result Date: 05/08/2019 CLINICAL DATA:  Acute pain following fall.  Initial encounter. EXAM: PORTABLE CHEST 1 VIEW COMPARISON:  09/07/2015 chest radiograph FINDINGS: The cardiomediastinal silhouette is unremarkable. There is no evidence of focal airspace disease, pulmonary edema, suspicious pulmonary nodule/mass, pleural effusion, or pneumothorax. No acute bony abnormalities are identified. IMPRESSION: No active disease. Electronically Signed   By: Margarette Canada M.D.   On: 05/08/2019 10:55   Dg Humerus Left  Result Date: 05/08/2019 CLINICAL DATA:  Pt fell through floor at work this a.m and tried to catch self with lt hand landing on lt side, complains of severe lt humerus pain, no chest complaints EXAM: LEFT HUMERUS - 2+ VIEW COMPARISON:  None. FINDINGS: There is a spiral displaced non comminuted fracture of the left humeral diaphysis. Fracture extends from the proximal to the mid diaphysis. The distal fracture component has displaced anteromedially by 3 cm. The fracture is mildly foreshortened by 1 to 1.5 cm. There is mild angulation between the proximal and distal fracture components of approximately 15 degrees. No other fractures. The elbow and shoulder joints are normally aligned. There is surrounding soft tissue swelling IMPRESSION: Displaced mildly angulated non comminuted spiral fracture of the left humeral diaphysis. Electronically Signed   By: Lajean Manes M.D.   On: 05/08/2019 10:57    Procedures Procedures (including critical care  time)  Medications Ordered in ED Medications  HYDROmorphone (DILAUDID) injection 1 mg (1 mg Intravenous Given 05/08/19 1020)     Initial Impression / Assessment and Plan / ED Course  I have reviewed the triage vital signs and the nursing notes.  Pertinent labs & imaging results that were available during my care of the patient were reviewed by me and considered in my medical decision making (see chart for details).   Patient has an isolated humerus fracture.  No other injuries noted.  Labs were done for preoperative assessment.  Patient does have a mild anemia but I do not think this is clinically significant.  Hyponatremia is noted however this appears to be chronic.  Previous sodium levels were 131.  Case was discussed with Dr. Lyla Glassing.  He recommended coaptation splint and outpatient follow-up.  Patient was splinted by orthopedic technician.  He tolerated well.  I reexamined the patient after the splinting and he remains neurovascularly intact.  We will asked patient to follow-up with his primary care doctor regarding his hyponatremia.  Final Clinical Impressions(s) / ED Diagnoses   Final diagnoses:  Closed displaced comminuted fracture of shaft of left humerus, initial encounter  Hyponatremia    ED Discharge Orders         Ordered    HYDROcodone-acetaminophen (NORCO/VICODIN) 5-325 MG tablet  Every 6 hours PRN     05/08/19 1228           Dorie Rank, MD 05/08/19 1233

## 2019-05-12 ENCOUNTER — Telehealth: Payer: Self-pay | Admitting: Plastic Surgery

## 2019-05-12 DIAGNOSIS — S42402A Unspecified fracture of lower end of left humerus, initial encounter for closed fracture: Secondary | ICD-10-CM | POA: Diagnosis not present

## 2019-05-12 NOTE — Telephone Encounter (Signed)
Reminder to call Dr. Maylon Peppers ([place]) [phone] when we are finished with the legs.

## 2019-05-13 DIAGNOSIS — S42322A Displaced transverse fracture of shaft of humerus, left arm, initial encounter for closed fracture: Secondary | ICD-10-CM | POA: Diagnosis not present

## 2019-05-18 ENCOUNTER — Other Ambulatory Visit (HOSPITAL_COMMUNITY)
Admission: RE | Admit: 2019-05-18 | Discharge: 2019-05-18 | Disposition: A | Discharge status: Home / Self Care | Payer: Medicare HMO | Source: Ambulatory Visit | Attending: Orthopedic Surgery | Admitting: Orthopedic Surgery

## 2019-05-18 DIAGNOSIS — Z01812 Encounter for preprocedural laboratory examination: Secondary | ICD-10-CM | POA: Insufficient documentation

## 2019-05-18 DIAGNOSIS — Z20828 Contact with and (suspected) exposure to other viral communicable diseases: Secondary | ICD-10-CM | POA: Diagnosis not present

## 2019-05-18 LAB — SARS CORONAVIRUS 2 (TAT 6-24 HRS): SARS Coronavirus 2: NEGATIVE

## 2019-05-19 ENCOUNTER — Encounter (HOSPITAL_COMMUNITY): Payer: Self-pay | Admitting: *Deleted

## 2019-05-19 NOTE — Progress Notes (Signed)
Mr. Hullender denies chest pain or shortenss of breath.  Patient tested negative 05/18/2019 for Covid and has been in quarantine since that time. I instructed to not smoke anymore until after surgery. I instructed patient to not eat after midnight. I instructed patient that he may have clear liquid until 9:30 am.  We reviewed what clear liquids consist of and patient voiced understanding. Mr Fichtel daughter is going to pick up Pre- Surgery Ensure today.

## 2019-05-19 NOTE — Pre-Procedure Instructions (Signed)
    Jesse Pope  05/19/2019       Remember:  Do not eat after midnight.  You may drink clear liquids until 9:30 AM .  Clear liquids allowed are:                    Water, Juice (non-citric and without pulp), Carbonated beverages, Clear Tea, Black Coffee only, Plain Jell-O only, Gatorade and Plain Popsicles only   Drink Pre- surgery Ensure between 8:30 AM and 9:39 AM- then Nothing Else to Drink   Take these medicines the morning of surgery with A SIP OF WATER before 9:30 AM Amlodipine Take if needed: Benadryl Robaxin

## 2019-05-19 NOTE — Anesthesia Preprocedure Evaluation (Addendum)
Anesthesia Evaluation  Patient identified by MRN, date of birth, ID band Patient awake    Reviewed: Allergy & Precautions, NPO status , Patient's Chart, lab work & pertinent test results  Airway Mallampati: I  TM Distance: >3 FB Neck ROM: Full    Dental  (+) Edentulous Upper, Edentulous Lower   Pulmonary Current Smoker,    breath sounds clear to auscultation       Cardiovascular hypertension, Pt. on medications  Rhythm:Regular Rate:Normal     Neuro/Psych negative neurological ROS     GI/Hepatic negative GI ROS, Neg liver ROS,   Endo/Other  negative endocrine ROS  Renal/GU negative Renal ROS     Musculoskeletal  (+) Arthritis ,   Abdominal Normal abdominal exam  (+)   Peds  Hematology negative hematology ROS (+)   Anesthesia Other Findings   Reproductive/Obstetrics                           Anesthesia Physical Anesthesia Plan  ASA: II  Anesthesia Plan: General   Post-op Pain Management: GA combined w/ Regional for post-op pain   Induction: Intravenous  PONV Risk Score and Plan: 2 and Ondansetron, Dexamethasone and Treatment may vary due to age or medical condition  Airway Management Planned: Oral ETT  Additional Equipment: None  Intra-op Plan:   Post-operative Plan: Extubation in OR  Informed Consent: I have reviewed the patients History and Physical, chart, labs and discussed the procedure including the risks, benefits and alternatives for the proposed anesthesia with the patient or authorized representative who has indicated his/her understanding and acceptance.       Plan Discussed with: CRNA  Anesthesia Plan Comments: (Hx of hyponatremia with baseline sodium ~130. Last labs at ED on 05/08/19 with sodium 126. Will recheck DOS.  Also with hx of borderline monocytosis - Most likely secondary to underlying malignancy, especially extensive SCCa of the skin which is followed  by Dr. Maylon Peppers and Dr. Marla Roe. )     Anesthesia Quick Evaluation

## 2019-05-20 ENCOUNTER — Encounter (HOSPITAL_COMMUNITY): Payer: Self-pay | Admitting: Anesthesiology

## 2019-05-20 ENCOUNTER — Ambulatory Visit (HOSPITAL_COMMUNITY): Payer: Medicare HMO | Admitting: Physician Assistant

## 2019-05-20 ENCOUNTER — Observation Stay (HOSPITAL_COMMUNITY)
Admission: AD | Admit: 2019-05-20 | Discharge: 2019-05-21 | Disposition: A | Discharge status: Home / Self Care | Payer: Medicare HMO | Attending: Orthopedic Surgery | Admitting: Orthopedic Surgery

## 2019-05-20 ENCOUNTER — Ambulatory Visit (HOSPITAL_COMMUNITY): Payer: Medicare HMO

## 2019-05-20 ENCOUNTER — Other Ambulatory Visit: Payer: Self-pay

## 2019-05-20 ENCOUNTER — Encounter (HOSPITAL_COMMUNITY)
Admission: AD | Disposition: A | Discharge status: Home / Self Care | Payer: Self-pay | Source: Home / Self Care | Attending: Orthopedic Surgery

## 2019-05-20 DIAGNOSIS — Z79899 Other long term (current) drug therapy: Secondary | ICD-10-CM | POA: Insufficient documentation

## 2019-05-20 DIAGNOSIS — I10 Essential (primary) hypertension: Secondary | ICD-10-CM | POA: Diagnosis not present

## 2019-05-20 DIAGNOSIS — M79602 Pain in left arm: Secondary | ICD-10-CM | POA: Insufficient documentation

## 2019-05-20 DIAGNOSIS — S42342A Displaced spiral fracture of shaft of humerus, left arm, initial encounter for closed fracture: Secondary | ICD-10-CM | POA: Diagnosis not present

## 2019-05-20 DIAGNOSIS — F17210 Nicotine dependence, cigarettes, uncomplicated: Secondary | ICD-10-CM | POA: Diagnosis not present

## 2019-05-20 DIAGNOSIS — T1490XA Injury, unspecified, initial encounter: Secondary | ICD-10-CM

## 2019-05-20 DIAGNOSIS — Z923 Personal history of irradiation: Secondary | ICD-10-CM | POA: Insufficient documentation

## 2019-05-20 DIAGNOSIS — E871 Hypo-osmolality and hyponatremia: Secondary | ICD-10-CM | POA: Diagnosis not present

## 2019-05-20 DIAGNOSIS — Z8546 Personal history of malignant neoplasm of prostate: Secondary | ICD-10-CM | POA: Insufficient documentation

## 2019-05-20 DIAGNOSIS — W1830XA Fall on same level, unspecified, initial encounter: Secondary | ICD-10-CM | POA: Insufficient documentation

## 2019-05-20 DIAGNOSIS — Z85828 Personal history of other malignant neoplasm of skin: Secondary | ICD-10-CM | POA: Diagnosis not present

## 2019-05-20 DIAGNOSIS — G8918 Other acute postprocedural pain: Secondary | ICD-10-CM | POA: Diagnosis not present

## 2019-05-20 DIAGNOSIS — S42302D Unspecified fracture of shaft of humerus, left arm, subsequent encounter for fracture with routine healing: Secondary | ICD-10-CM | POA: Diagnosis not present

## 2019-05-20 HISTORY — PX: ORIF HUMERUS FRACTURE: SHX2126

## 2019-05-20 HISTORY — DX: Unspecified osteoarthritis, unspecified site: M19.90

## 2019-05-20 HISTORY — DX: Pneumonia, unspecified organism: J18.9

## 2019-05-20 LAB — BASIC METABOLIC PANEL
Anion gap: 9 (ref 5–15)
BUN: 10 mg/dL (ref 8–23)
CO2: 23 mmol/L (ref 22–32)
Calcium: 8.7 mg/dL — ABNORMAL LOW (ref 8.9–10.3)
Chloride: 96 mmol/L — ABNORMAL LOW (ref 98–111)
Creatinine, Ser: 0.94 mg/dL (ref 0.61–1.24)
GFR calc Af Amer: >60 mL/min (ref >60)
GFR calc non Af Amer: >60 mL/min (ref >60)
Glucose, Bld: 127 mg/dL — ABNORMAL HIGH (ref 70–99)
Potassium: 4 mmol/L (ref 3.5–5.1)
Sodium: 128 mmol/L — ABNORMAL LOW (ref 135–145)

## 2019-05-20 LAB — SURGICAL PCR SCREEN
MRSA, PCR: NEGATIVE
Staphylococcus aureus: NEGATIVE

## 2019-05-20 SURGERY — OPEN REDUCTION INTERNAL FIXATION (ORIF) PROXIMAL HUMERUS FRACTURE
Anesthesia: General | Site: Arm Upper | Laterality: Left

## 2019-05-20 MED ORDER — ONDANSETRON HCL 4 MG/2ML IJ SOLN
INTRAMUSCULAR | Status: AC
  Filled 2019-05-20: qty 2

## 2019-05-20 MED ORDER — MEPERIDINE HCL 25 MG/ML IJ SOLN: 6.2500 mg | INTRAMUSCULAR | Status: DC | PRN

## 2019-05-20 MED ORDER — VANCOMYCIN HCL 1000 MG IV SOLR
INTRAVENOUS | Status: AC
  Filled 2019-05-20: qty 1000

## 2019-05-20 MED ORDER — HYDROMORPHONE HCL 1 MG/ML IJ SOLN: 0.2500 mg | INTRAMUSCULAR | Status: DC | PRN

## 2019-05-20 MED ORDER — MORPHINE SULFATE (PF) 2 MG/ML IV SOLN: 0.5000 mg | INTRAVENOUS | Status: DC | PRN

## 2019-05-20 MED ORDER — LACTATED RINGERS IV SOLN
INTRAVENOUS | Status: DC
  Administered 2019-05-20: 11:00:00 via INTRAVENOUS

## 2019-05-20 MED ORDER — VANCOMYCIN HCL 1000 MG IV SOLR
INTRAVENOUS | Status: DC | PRN
  Administered 2019-05-20: 1000 mg via TOPICAL

## 2019-05-20 MED ORDER — PHENYLEPHRINE HCL (PRESSORS) 10 MG/ML IV SOLN
INTRAVENOUS | Status: DC | PRN
  Administered 2019-05-20: 80 ug via INTRAVENOUS
  Administered 2019-05-20: 120 ug via INTRAVENOUS
  Administered 2019-05-20: 80 ug via INTRAVENOUS

## 2019-05-20 MED ORDER — DOCUSATE SODIUM 100 MG PO CAPS
100.0000 mg | ORAL_CAPSULE | Freq: Two times a day (BID) | ORAL | Status: DC
  Administered 2019-05-20 – 2019-05-21 (×2): 100 mg via ORAL
  Filled 2019-05-20 (×2): qty 1

## 2019-05-20 MED ORDER — TRANEXAMIC ACID-NACL 1000-0.7 MG/100ML-% IV SOLN
1000.0000 mg | Freq: Once | INTRAVENOUS | Status: AC
  Administered 2019-05-20: 1000 mg via INTRAVENOUS
  Filled 2019-05-20: qty 100

## 2019-05-20 MED ORDER — CEFAZOLIN SODIUM-DEXTROSE 2-3 GM-%(50ML) IV SOLR
INTRAVENOUS | Status: DC | PRN
  Administered 2019-05-20: 2 g via INTRAVENOUS

## 2019-05-20 MED ORDER — ONDANSETRON HCL 4 MG/2ML IJ SOLN
INTRAMUSCULAR | Status: DC | PRN
  Administered 2019-05-20: 4 mg via INTRAVENOUS

## 2019-05-20 MED ORDER — ACETAMINOPHEN 500 MG PO TABS
500.0000 mg | ORAL_TABLET | Freq: Four times a day (QID) | ORAL | Status: AC
  Administered 2019-05-20 – 2019-05-21 (×4): 500 mg via ORAL
  Filled 2019-05-20 (×4): qty 1

## 2019-05-20 MED ORDER — CHLORHEXIDINE GLUCONATE 4 % EX LIQD: 60.0000 mL | Freq: Once | CUTANEOUS | Status: DC

## 2019-05-20 MED ORDER — FENTANYL CITRATE (PF) 100 MCG/2ML IJ SOLN
50.0000 ug | Freq: Once | INTRAMUSCULAR | Status: AC
  Administered 2019-05-20: 12:00:00 50 ug via INTRAVENOUS

## 2019-05-20 MED ORDER — METHOCARBAMOL 1000 MG/10ML IJ SOLN
500.0000 mg | Freq: Four times a day (QID) | INTRAVENOUS | Status: DC | PRN
  Filled 2019-05-20: qty 5

## 2019-05-20 MED ORDER — LACTATED RINGERS IV SOLN
INTRAVENOUS | Status: DC
  Administered 2019-05-20: 18:00:00 via INTRAVENOUS

## 2019-05-20 MED ORDER — METHOCARBAMOL 500 MG PO TABS: 500.0000 mg | ORAL_TABLET | Freq: Four times a day (QID) | ORAL | Status: DC | PRN

## 2019-05-20 MED ORDER — PHENOL 1.4 % MT LIQD: 1.0000 | OROMUCOSAL | Status: DC | PRN

## 2019-05-20 MED ORDER — DEXAMETHASONE SODIUM PHOSPHATE 10 MG/ML IJ SOLN
INTRAMUSCULAR | Status: AC
  Filled 2019-05-20: qty 1

## 2019-05-20 MED ORDER — ACETAMINOPHEN 160 MG/5ML PO SOLN: 325.0000 mg | Freq: Once | ORAL | Status: DC | PRN

## 2019-05-20 MED ORDER — MIDAZOLAM HCL 2 MG/2ML IJ SOLN
2.0000 mg | Freq: Once | INTRAMUSCULAR | Status: AC
  Administered 2019-05-20: 12:00:00 2 mg via INTRAVENOUS

## 2019-05-20 MED ORDER — LIDOCAINE 2% (20 MG/ML) 5 ML SYRINGE
INTRAMUSCULAR | Status: AC
  Filled 2019-05-20: qty 5

## 2019-05-20 MED ORDER — BUPIVACAINE HCL (PF) 0.5 % IJ SOLN
INTRAMUSCULAR | Status: DC | PRN
  Administered 2019-05-20: 12 mL via PERINEURAL

## 2019-05-20 MED ORDER — TRANEXAMIC ACID-NACL 1000-0.7 MG/100ML-% IV SOLN
1000.0000 mg | INTRAVENOUS | Status: AC
  Administered 2019-05-20: 1000 mg via INTRAVENOUS

## 2019-05-20 MED ORDER — NAPROXEN 250 MG PO TABS
250.0000 mg | ORAL_TABLET | Freq: Two times a day (BID) | ORAL | Status: DC
  Administered 2019-05-20: 250 mg via ORAL
  Filled 2019-05-20 (×2): qty 1

## 2019-05-20 MED ORDER — ONDANSETRON HCL 4 MG PO TABS: 4.0000 mg | ORAL_TABLET | Freq: Four times a day (QID) | ORAL | Status: DC | PRN

## 2019-05-20 MED ORDER — PROMETHAZINE HCL 25 MG/ML IJ SOLN: 6.2500 mg | INTRAMUSCULAR | Status: DC | PRN

## 2019-05-20 MED ORDER — CEFAZOLIN SODIUM-DEXTROSE 2-4 GM/100ML-% IV SOLN
2.0000 g | Freq: Four times a day (QID) | INTRAVENOUS | Status: AC
  Administered 2019-05-20 – 2019-05-21 (×3): 2 g via INTRAVENOUS
  Filled 2019-05-20 (×3): qty 100

## 2019-05-20 MED ORDER — TRANEXAMIC ACID-NACL 1000-0.7 MG/100ML-% IV SOLN: 1000.0000 mg | Freq: Once | INTRAVENOUS | Status: DC

## 2019-05-20 MED ORDER — METOCLOPRAMIDE HCL 5 MG/ML IJ SOLN: 5.0000 mg | Freq: Three times a day (TID) | INTRAMUSCULAR | Status: DC | PRN

## 2019-05-20 MED ORDER — ROCURONIUM BROMIDE 100 MG/10ML IV SOLN
INTRAVENOUS | Status: DC | PRN
  Administered 2019-05-20: 70 mg via INTRAVENOUS

## 2019-05-20 MED ORDER — RAMIPRIL 10 MG PO CAPS
10.0000 mg | ORAL_CAPSULE | Freq: Every day | ORAL | Status: DC
  Administered 2019-05-20 – 2019-05-21 (×2): 10 mg via ORAL
  Filled 2019-05-20 (×2): qty 1

## 2019-05-20 MED ORDER — SUGAMMADEX SODIUM 200 MG/2ML IV SOLN
INTRAVENOUS | Status: DC | PRN
  Administered 2019-05-20: 200 mg via INTRAVENOUS

## 2019-05-20 MED ORDER — FENTANYL CITRATE (PF) 250 MCG/5ML IJ SOLN
INTRAMUSCULAR | Status: AC
  Filled 2019-05-20: qty 5

## 2019-05-20 MED ORDER — ACETAMINOPHEN 10 MG/ML IV SOLN: 1000.0000 mg | Freq: Once | INTRAVENOUS | Status: DC | PRN

## 2019-05-20 MED ORDER — MIDAZOLAM HCL 2 MG/2ML IJ SOLN
INTRAMUSCULAR | Status: AC
  Administered 2019-05-20: 2 mg via INTRAVENOUS
  Filled 2019-05-20: qty 2

## 2019-05-20 MED ORDER — METOCLOPRAMIDE HCL 5 MG PO TABS: 5.0000 mg | ORAL_TABLET | Freq: Three times a day (TID) | ORAL | Status: DC | PRN

## 2019-05-20 MED ORDER — TRANEXAMIC ACID-NACL 1000-0.7 MG/100ML-% IV SOLN
INTRAVENOUS | Status: AC
  Filled 2019-05-20: qty 100

## 2019-05-20 MED ORDER — ACETAMINOPHEN 325 MG PO TABS: 325.0000 mg | ORAL_TABLET | Freq: Four times a day (QID) | ORAL | Status: DC | PRN

## 2019-05-20 MED ORDER — CEFAZOLIN SODIUM-DEXTROSE 2-4 GM/100ML-% IV SOLN
INTRAVENOUS | Status: AC
  Administered 2019-05-20: 18:00:00 2 g via INTRAVENOUS
  Filled 2019-05-20: qty 100

## 2019-05-20 MED ORDER — ACETAMINOPHEN 325 MG PO TABS: 325.0000 mg | ORAL_TABLET | Freq: Once | ORAL | Status: DC | PRN

## 2019-05-20 MED ORDER — DEXAMETHASONE SODIUM PHOSPHATE 10 MG/ML IJ SOLN
INTRAMUSCULAR | Status: DC | PRN
  Administered 2019-05-20: 5 mg via INTRAVENOUS

## 2019-05-20 MED ORDER — SODIUM CHLORIDE 0.9 % IR SOLN
Status: DC | PRN
  Administered 2019-05-20: 1000 mL

## 2019-05-20 MED ORDER — LACTATED RINGERS IV SOLN
INTRAVENOUS | Status: DC | PRN
  Administered 2019-05-20 (×2): via INTRAVENOUS

## 2019-05-20 MED ORDER — AMLODIPINE BESYLATE 10 MG PO TABS
10.0000 mg | ORAL_TABLET | Freq: Every day | ORAL | Status: DC
  Administered 2019-05-21: 09:00:00 10 mg via ORAL
  Filled 2019-05-20: qty 1

## 2019-05-20 MED ORDER — HYDROCODONE-ACETAMINOPHEN 7.5-325 MG PO TABS: 1.0000 | ORAL_TABLET | ORAL | Status: DC | PRN

## 2019-05-20 MED ORDER — ROCURONIUM BROMIDE 10 MG/ML (PF) SYRINGE
PREFILLED_SYRINGE | INTRAVENOUS | Status: AC
  Filled 2019-05-20: qty 10

## 2019-05-20 MED ORDER — FENTANYL CITRATE (PF) 100 MCG/2ML IJ SOLN
INTRAMUSCULAR | Status: AC
  Administered 2019-05-20: 50 ug via INTRAVENOUS
  Filled 2019-05-20: qty 2

## 2019-05-20 MED ORDER — PHENYLEPHRINE HCL-NACL 10-0.9 MG/250ML-% IV SOLN
INTRAVENOUS | Status: DC | PRN
  Administered 2019-05-20: 20 ug/min via INTRAVENOUS

## 2019-05-20 MED ORDER — ASPIRIN EC 325 MG PO TBEC
325.0000 mg | DELAYED_RELEASE_TABLET | Freq: Every day | ORAL | Status: DC
  Administered 2019-05-21: 325 mg via ORAL
  Filled 2019-05-20: qty 1

## 2019-05-20 MED ORDER — ONDANSETRON HCL 4 MG/2ML IJ SOLN: 4.0000 mg | Freq: Four times a day (QID) | INTRAMUSCULAR | Status: DC | PRN

## 2019-05-20 MED ORDER — BUPIVACAINE LIPOSOME 1.3 % IJ SUSP
INTRAMUSCULAR | Status: DC | PRN
  Administered 2019-05-20: 10 mL via PERINEURAL

## 2019-05-20 MED ORDER — MENTHOL 3 MG MT LOZG: 1.0000 | LOZENGE | OROMUCOSAL | Status: DC | PRN

## 2019-05-20 MED ORDER — PROPOFOL 10 MG/ML IV BOLUS
INTRAVENOUS | Status: DC | PRN
  Administered 2019-05-20: 150 mg via INTRAVENOUS
  Administered 2019-05-20: 50 mg via INTRAVENOUS

## 2019-05-20 MED ORDER — PROPOFOL 10 MG/ML IV BOLUS
INTRAVENOUS | Status: AC
  Filled 2019-05-20: qty 20

## 2019-05-20 MED ORDER — HYDROCODONE-ACETAMINOPHEN 5-325 MG PO TABS: 1.0000 | ORAL_TABLET | ORAL | Status: DC | PRN

## 2019-05-20 MED ORDER — FENTANYL CITRATE (PF) 100 MCG/2ML IJ SOLN
INTRAMUSCULAR | Status: DC | PRN
  Administered 2019-05-20: 100 ug via INTRAVENOUS

## 2019-05-20 SURGICAL SUPPLY — 61 items
BIT DRILL 2.5X110 QC LCP DISP (BIT) ×1 IMPLANT
BIT DRILL PERC QC 2.8X200 100 (BIT) IMPLANT
COVER SURGICAL LIGHT HANDLE (MISCELLANEOUS) ×2 IMPLANT
COVER WAND RF STERILE (DRAPES) ×2 IMPLANT
DRAPE INCISE IOBAN 66X45 STRL (DRAPES) ×1 IMPLANT
DRAPE ORTHO SPLIT 77X108 STRL (DRAPES) ×2
DRAPE SURG ORHT 6 SPLT 77X108 (DRAPES) ×2 IMPLANT
DRAPE U-SHAPE 47X51 STRL (DRAPES) ×2 IMPLANT
DRILL BIT QUICK COUP 2.8MM 100 (BIT) ×1
DRSG ADAPTIC 3X8 NADH LF (GAUZE/BANDAGES/DRESSINGS) ×1 IMPLANT
DRSG AQUACEL AG ADV 3.5X 4 (GAUZE/BANDAGES/DRESSINGS) IMPLANT
DRSG AQUACEL AG ADV 3.5X 6 (GAUZE/BANDAGES/DRESSINGS) IMPLANT
DRSG PAD ABDOMINAL 8X10 ST (GAUZE/BANDAGES/DRESSINGS) ×1 IMPLANT
DURAPREP 26ML APPLICATOR (WOUND CARE) ×2 IMPLANT
ELECT REM PT RETURN 9FT ADLT (ELECTROSURGICAL) ×2
ELECTRODE REM PT RTRN 9FT ADLT (ELECTROSURGICAL) ×1 IMPLANT
GAUZE SPONGE 4X4 12PLY STRL (GAUZE/BANDAGES/DRESSINGS) ×1 IMPLANT
GLOVE BIO SURGEON STRL SZ7.5 (GLOVE) ×2 IMPLANT
GLOVE BIOGEL PI IND STRL 8 (GLOVE) ×1 IMPLANT
GLOVE BIOGEL PI INDICATOR 8 (GLOVE) ×1
GOWN STRL REUS W/ TWL LRG LVL3 (GOWN DISPOSABLE) ×1 IMPLANT
GOWN STRL REUS W/ TWL XL LVL3 (GOWN DISPOSABLE) ×1 IMPLANT
GOWN STRL REUS W/TWL LRG LVL3 (GOWN DISPOSABLE) ×1
GOWN STRL REUS W/TWL XL LVL3 (GOWN DISPOSABLE) ×2
KIT BASIN OR (CUSTOM PROCEDURE TRAY) ×2 IMPLANT
KIT TURNOVER KIT B (KITS) ×2 IMPLANT
MANIFOLD NEPTUNE II (INSTRUMENTS) ×2 IMPLANT
NS IRRIG 1000ML POUR BTL (IV SOLUTION) ×2 IMPLANT
PACK SHOULDER (CUSTOM PROCEDURE TRAY) ×2 IMPLANT
PAD ARMBOARD 7.5X6 YLW CONV (MISCELLANEOUS) ×4 IMPLANT
PLATE HUMERUS LCP PROX 10 HOLE (Plate) ×1 IMPLANT
SCREW CORTEX 3.5 26MM (Screw) ×1 IMPLANT
SCREW CORTEX 3.5 28MM (Screw) ×2 IMPLANT
SCREW CORTEX 3.5 30MM (Screw) ×2 IMPLANT
SCREW LOCK CORT ST 3.5X26 (Screw) IMPLANT
SCREW LOCK CORT ST 3.5X28 (Screw) IMPLANT
SCREW LOCK CORT ST 3.5X30 (Screw) IMPLANT
SCREW LOCK T15 FT 28X3.5X2.9X (Screw) IMPLANT
SCREW LOCK T15 FT 32X3.5X2.9X (Screw) IMPLANT
SCREW LOCKING 3.5 (Screw) ×2 IMPLANT
SCREW LOCKING 3.5X28 (Screw) ×2 IMPLANT
SCREW LOCKING 3.5X32 (Screw) ×2 IMPLANT
SCREW LOCKING 3.5X34 (Screw) ×1 IMPLANT
SLING ARM FOAM STRAP LRG (SOFTGOODS) ×1 IMPLANT
SLING ARM FOAM STRAP MED (SOFTGOODS) IMPLANT
SPONGE LAP 18X18 RF (DISPOSABLE) IMPLANT
SPONGE LAP 4X18 RFD (DISPOSABLE) IMPLANT
STAPLER VISISTAT 35W (STAPLE) ×1 IMPLANT
STRIP CLOSURE SKIN 1/2X4 (GAUZE/BANDAGES/DRESSINGS) IMPLANT
SUCTION FRAZIER HANDLE 10FR (MISCELLANEOUS) ×1
SUCTION TUBE FRAZIER 10FR DISP (MISCELLANEOUS) ×1 IMPLANT
SUT FIBERWIRE #2 38 T-5 BLUE (SUTURE) ×2
SUT MNCRL AB 4-0 PS2 18 (SUTURE) IMPLANT
SUT VIC AB 2-0 CT1 27 (SUTURE) ×3
SUT VIC AB 2-0 CT1 TAPERPNT 27 (SUTURE) ×1 IMPLANT
SUTURE FIBERWR #2 38 T-5 BLUE (SUTURE) IMPLANT
SYR CONTROL 10ML LL (SYRINGE) IMPLANT
TOWEL GREEN STERILE (TOWEL DISPOSABLE) ×2 IMPLANT
TOWEL GREEN STERILE FF (TOWEL DISPOSABLE) ×2 IMPLANT
WATER STERILE IRR 1000ML POUR (IV SOLUTION) ×2 IMPLANT
YANKAUER SUCT BULB TIP NO VENT (SUCTIONS) ×2 IMPLANT

## 2019-05-20 NOTE — H&P (Signed)
ORTHOPAEDIC H and P  REQUESTING PHYSICIAN: Nicholes Stairs, MD  PCP:  Lawerance Cruel, MD  Chief Complaint: Left humerus fracture  HPI: Jesse Pope is a 68 y.o. male who complains of Left arm pain and deformity following a ground-level fall about 10 days ago.  He is here today for open reduction internal fixation.  No new complaints at this time.   Past Medical History:  Diagnosis Date  . Alcohol abuse 09/07/2015  . Arthritis   . Basal cell carcinoma of right lower leg   . Cellulitis and abscess of leg 09/07/2015  . Cigarette smoker 02/11/2014  . Eczema 02/11/2014  . Hypertension   . Hyponatremia 02/08/2014  . MRSA (methicillin resistant Staphylococcus aureus)   . Nocturia associated with benign prostatic hypertrophy 09/07/2015  . Pneumonia   . Prostate cancer ([place])    radiation  . Pulmonary nodules 09/10/2015   Past Surgical History:  Procedure Laterality Date  . BACK SURGERY     lower- disc  . Biospy of Right Lower Extremity  01/27/2014   SCC  . COLONOSCOPY W/ POLYPECTOMY    . EYE SURGERY Left    cataract  . Heel fracture     patient doesnt remember which one  . I&D EXTREMITY Left 02/10/2014   Procedure: IRRIGATION AND DEBRIDEMENT EXTREMITY/PRE PATELLA BURSA;  Surgeon: Marin Shutter, MD;  Location: [place];  Service: Orthopedics;  Laterality: Left;  . I&D EXTREMITY Left 02/15/2014   Procedure: IRRIGATION AND DEBRIDEMENT EXTREMITY/DELAY CLOSURE OF LEFT KNEE;  Surgeon: Marin Shutter, MD;  Location: [place];  Service: Orthopedics;  Laterality: Left;  . RETINAL DETACHMENT SURGERY Left    Social History   Socioeconomic History  . Marital status: Married    Spouse name: Not on file  . Number of children: 3  . Years of education: Not on file  . Highest education level: Not on file  Occupational History  . Occupation: Development worker, community    Comment: limited hours  Social Needs  . Financial resource strain: Not on file  . Food insecurity    Worry: Not on file   Inability: Not on file  . Transportation needs    Medical: Not on file    Non-medical: Not on file  Tobacco Use  . Smoking status: Current Every Day Smoker    Packs/day: 3.00    Years: 51.00    Pack years: 153.00    Types: Cigars  . Smokeless tobacco: Never Used  . Tobacco comment: uncertain of number of cigars smoked per day  Substance and Sexual Activity  . Alcohol use: Yes    Alcohol/week: 0.0 standard drinks    Comment: Drinks Alcohol daily  . Drug use: No  . Sexual activity: Not Currently  Lifestyle  . Physical activity    Days per week: Not on file    Minutes per session: Not on file  . Stress: Not on file  Relationships  . Social Herbalist on phone: Not on file    Gets together: Not on file    Attends religious service: Not on file    Active member of club or organization: Not on file    Attends meetings of clubs or organizations: Not on file    Relationship status: Not on file  Other Topics Concern  . Not on file  Social History Narrative   Patient resides in [place] independently. Patient has two daughters and one son. Does not have any grandchildren  yet. Works limited hours as a Development worker, community.   Family History  Problem Relation Age of Onset  . Colon cancer Mother   . Crohn's disease Father   . Breast cancer Sister     Prior to Admission medications   Medication Sig Start Date End Date Taking? Authorizing Provider  amLODipine (NORVASC) 10 MG tablet Take 10 mg by mouth daily.   Yes [provider]  diphenhydrAMINE (BENADRYL) 25 MG tablet Take 25 mg by mouth every morning.   Yes [provider]  HYDROcodone-acetaminophen (NORCO/VICODIN) 5-325 MG tablet Take 1 tablet by mouth every 6 (six) hours as needed. 05/08/19  Yes Dorie Rank, MD  loperamide (IMODIUM) 1 MG/5ML solution Take 4 mg by mouth as needed for diarrhea or loose stools.   Yes [provider]  methocarbamol (ROBAXIN) 500 MG tablet Take 1 tablet (500 mg total) by  mouth every 6 (six) hours as needed for muscle spasms. 02/14/18  Yes Tyler Pita, MD  ramipril (ALTACE) 10 MG capsule Take 10 mg by mouth daily.   Yes [provider]   No results found.  Positive ROS: All other systems have been reviewed and were otherwise negative with the exception of those mentioned in the HPI and as above.  Physical Exam: General: Alert, no acute distress Cardiovascular: No pedal edema Respiratory: No cyanosis, no use of accessory musculature GI: No organomegaly, abdomen is soft and non-tender Skin: No lesions in the area of chief complaint Neurologic: Sensation intact distally Psychiatric: Patient is competent for consent with normal mood and affect Lymphatic: No axillary or cervical lymphadenopathy  MUSCULOSKELETAL:  Left upper extremity is clean, dry, and intact with no open wounds or lesions.  His neurovascular intact.  Assessment: Left Long spiral humeral shaft fracture with displacement.  Plan: - Our plan is for open reduction and internal fixation of the left humerus today.  He has a stress desired to go home postoperatively.  We will monitor him closely and consider discharge home if he is able.  Otherwise he will stay overnight for pain control.  - The risks, benefits, and alternatives were discussed with the patient. There are risks associated with the surgery including, but not limited to, problems with anesthesia (death), infection, fracture of bones, loosening or failure of implants, malunion, nonunion, hematoma (blood accumulation) which may require surgical drainage, blood clots, pulmonary embolism, nerve injury, and blood vessel injury. The patient understands these risks and elects to proceed.     Nicholes Stairs, MD Cell [phone]    05/20/2019 10:22 AM

## 2019-05-20 NOTE — Brief Op Note (Signed)
05/20/2019  2:52 PM  PATIENT:  Jesse Pope  68 y.o. male  PRE-OPERATIVE DIAGNOSIS:  Left humerus fracture  POST-OPERATIVE DIAGNOSIS:  Left humerus fracture  PROCEDURE:  Procedure(s) with comments: OPEN REDUCTION INTERNAL FIXATION (ORIF) HUMERUS FRACTURE (Left) - 120 mins  SURGEON:  Surgeon(s) and Role:    * Stann Mainland, Elly Modena, MD - Primary  PHYSICIAN ASSISTANT:   ASSISTANTS: Katy Apo, RNFA   ANESTHESIA:   regional and general  EBL:  200 mL   BLOOD ADMINISTERED:none  DRAINS: none   LOCAL MEDICATIONS USED:  NONE  SPECIMEN:  No Specimen  DISPOSITION OF SPECIMEN:  N/A  COUNTS:  YES  TOURNIQUET:  * No tourniquets in log *  DICTATION: .Note written in EPIC  PLAN OF CARE: Admit for overnight observation  PATIENT DISPOSITION:  PACU - hemodynamically stable.   Delay start of Pharmacological VTE agent (>24hrs) due to surgical blood loss or risk of bleeding: not applicable

## 2019-05-20 NOTE — Progress Notes (Signed)
Reported Na+ result to Dr. Smith Robert.  No order received.

## 2019-05-20 NOTE — Transfer of Care (Signed)
Immediate Anesthesia Transfer of Care Note  Patient: Jesse Pope  Procedure(s) Performed: OPEN REDUCTION INTERNAL FIXATION (ORIF) HUMERUS FRACTURE (Left Arm Upper)  Patient Location: PACU  Anesthesia Type:GA combined with regional for post-op pain  Level of Consciousness: awake, alert  and oriented  Airway & Oxygen Therapy: Patient Spontanous Breathing and Patient connected to nasal cannula oxygen  Post-op Assessment: Report given to RN and Post -op Vital signs reviewed and stable  Post vital signs: Reviewed and stable  Last Vitals:  Vitals Value Taken Time  BP 124/89 05/20/19 1453  Temp    Pulse 83 05/20/19 1458  Resp 23 05/20/19 1458  SpO2 100 % 05/20/19 1458  Vitals shown include unvalidated device data.  Last Pain:  Vitals:   05/20/19 1150  TempSrc:   PainSc: 0-No pain         Complications: No apparent anesthesia complications

## 2019-05-20 NOTE — Progress Notes (Signed)
Orthopedic Tech Progress Note Patient Details:  Jesse Pope 10-25-1950 [id number]  Casting Type of Cast: Long arm cast Cast Location: LUE Cast Intervention: Removal        Braulio Bosch 05/20/2019, 12:48 PM

## 2019-05-20 NOTE — Discharge Instructions (Signed)
-  Maintain sling unless doing activities of daily living exercise.  You may remove your prior sling and pendulum shoulder and elbow, hand, and wrist range of motion as tolerated.  -Maintain postoperative bandage until your follow-up appointment.  You may shower with this bandage in place.  If it become soiled or saturated replace with a daily dry dressing.  -Apply ice to the left arm for 20 to 30 minutes/h that you are awake.  -For mild to moderate pain use Tylenol.  For breakthrough pain use oxycodone as needed.  -Return to see Dr. Stann Mainland in 2 weeks for routine postop care.

## 2019-05-20 NOTE — Op Note (Signed)
05/20/2019  2:53 PM  PATIENT:  Jesse Pope    PRE-OPERATIVE DIAGNOSIS:  Left humerus fracture  POST-OPERATIVE DIAGNOSIS:  Same  PROCEDURE:  OPEN REDUCTION INTERNAL FIXATION (ORIF) HUMERUS FRACTURE  SURGEON:  Nicholes Stairs, MD  ASSISTANT: Katy Apo, RNFA  ANESTHESIA:   General  PREOPERATIVE INDICATIONS:  Jesse Pope is a  68 y.o. male with a diagnosis of Left humerus fracture who elected for surgical management.  Following a ground-level fall resulting had a long spiral fracture of the left humeral diaphysis.  This is very hydrated 200% displaced He had increased pain and had failed to 2 weeks of observation.  The risks benefits and alternatives were discussed with the patient including but not limited to the risks of nonoperative treatment, versus surgical intervention including infection, bleeding, nerve injury, malunion, nonunion, the need for revision surgery, hardware prominence, hardware failure, the need for hardware removal, blood clots, cardiopulmonary complications, conversion to arthroplasty, morbidity, mortality, among others, and they were willing to proceed.  Predicted outcome is good, although there will be at least a six to nine month expected recovery.   OPERATIVE IMPLANTS:  Synthes 3.94mm proximal humerus of the plate, 10 hole with locking and nonlocking screws.  OPERATIVE FINDINGS: Displaced  humerus fracture.  Long spiral fracture of the diaphysis with proximal extension.  This was completely extra-articular.  OPERATIVE PROCEDURE: The patient was brought to the operating room and placed in the supine position. General anesthesia was administered. IV antibiotics were given. He was placed in the supine position on the radio-lucent table. All bony prominences were padded. The upper extremity was prepped and draped in usual sterile fashion. Deltopectoral incision was performed with extension to anterolateral approach over the humerus.  I exposed the  fracture site, and placed deep retractors. I did not tenotomize the biceps tendon. This was left in place. I elevated a small portion of the deltoid off of the shaft, in order to gain access for the plate.  Subdeltoid space was cleared of any adhesions.  Deep retractors were placed..  The distal aspect of the incision was carried out just along the lateral border of the biceps.  The biceps was then retracted medially.  The brachialis muscle was previously traumatized by the fracture fragments.  This was then extended proximally and distally with a periosteal elevator.  Deep retractors were placed with care taken to avoid the radial nerve distally.  Proximally the deep retractors were placed under the elevated deltoid muscle.  The fracture was then debrided of any interposed periosteum and hematoma.  Anatomic reduction was achieved via direct visualization and the clamp was placed.  Fragmentary screws were placed using 3.5 mm cortical screws.  The reduction was again checked with AP and lateral intraoperative fluoroscopy.  Reduction was satisfactory.  We then applied the 10 hole proximal humerus plate.  This was pinned in place.  We then checked the position of the plate with intraoperative fluoroscopy.  Once satisfactory reduction and plate position was agreed upon we proceeded with 2 cortical 3.5 millimeter screws to compress the plate to bone.  We then filled proximally with locking screws and distally with locking screws.  Proximally we placed 6 screws distally 4.  Next, we again checked the placement of orthopedic hardware including screw lengths with AP and lateral fluoroscopy.  This was determined to be appropriate.  We then repaired the anterior deltoid insertion with a #2 FiberWire to the plate.  Wound was copiously irrigated with 3 L  of normal saline.  The brachialis fascia was repaired distally with 0 Vicryl figure-of-eight fashion.  We then closed subcutaneous tissue with 2-0 Vicryl.  Skin was  closed with staples.  Standard sterile dressing was applied.  He was placed in a sling.   All counts were correct x2.  There were no noted intraoperative complications.  Patient was transported to PACU in stable condition.   Disposition:  Jesse Pope will be in a sling and nonweightbearing for approximately 6 weeks.  He will be able to discard the sling and work on elbow, hand, and wrist range of motion as tolerated and shoulder elevation to 90 degrees starting immediately.  He will be admitted overnight for observation and pain control.  We will see him in the office in 2 weeks for routine postoperative check.

## 2019-05-20 NOTE — Anesthesia Procedure Notes (Signed)
Anesthesia Regional Block: Interscalene brachial plexus block   Pre-Anesthetic Checklist: ,, timeout performed, Correct Patient, Correct Site, Correct Laterality, Correct Procedure, Correct Position, site marked, Risks and benefits discussed,  Surgical consent,  Pre-op evaluation,  At surgeon's request and post-op pain management  Laterality: Left  Prep: chloraprep       Needles:  Injection technique: Single-shot  Needle Type: Echogenic Stimulator Needle     Needle Length: 9cm  Needle Gauge: 21     Additional Needles:   Procedures:,,,, ultrasound used (permanent image in chart),,,,  Narrative:  Start time: 05/20/2019 11:40 AM End time: 05/20/2019 11:50 AM Injection made incrementally with aspirations every 5 mL.  Performed by: Personally  Anesthesiologist: Effie Berkshire, MD  Additional Notes: Patient tolerated the procedure well. Local anesthetic introduced in an incremental fashion under minimal resistance after negative aspirations. No paresthesias were elicited. After completion of the procedure, no acute issues were identified and patient continued to be monitored by RN.

## 2019-05-20 NOTE — Anesthesia Procedure Notes (Signed)
Procedure Name: Intubation Date/Time: 05/20/2019 12:56 PM Performed by: Baudelia Schroepfer T, CRNA Pre-anesthesia Checklist: Patient identified, Emergency Drugs available, Suction available and Patient being monitored Patient Re-evaluated:Patient Re-evaluated prior to induction Oxygen Delivery Method: Circle system utilized Preoxygenation: Pre-oxygenation with 100% oxygen Induction Type: IV induction Ventilation: Mask ventilation without difficulty Laryngoscope Size: Miller and 3 Grade View: Grade I Tube type: Oral Tube size: 7.5 mm Number of attempts: 1 Airway Equipment and Method: Patient positioned with wedge pillow and Stylet Placement Confirmation: ETT inserted through vocal cords under direct vision,  positive ETCO2 and breath sounds checked- equal and bilateral Secured at: 23 cm Tube secured with: Tape Dental Injury: Teeth and Oropharynx as per pre-operative assessment

## 2019-05-21 DIAGNOSIS — M79602 Pain in left arm: Secondary | ICD-10-CM | POA: Diagnosis not present

## 2019-05-21 DIAGNOSIS — F17210 Nicotine dependence, cigarettes, uncomplicated: Secondary | ICD-10-CM | POA: Diagnosis not present

## 2019-05-21 DIAGNOSIS — S42342A Displaced spiral fracture of shaft of humerus, left arm, initial encounter for closed fracture: Secondary | ICD-10-CM | POA: Diagnosis not present

## 2019-05-21 DIAGNOSIS — Z923 Personal history of irradiation: Secondary | ICD-10-CM | POA: Diagnosis not present

## 2019-05-21 DIAGNOSIS — Z8546 Personal history of malignant neoplasm of prostate: Secondary | ICD-10-CM | POA: Diagnosis not present

## 2019-05-21 DIAGNOSIS — Z85828 Personal history of other malignant neoplasm of skin: Secondary | ICD-10-CM | POA: Diagnosis not present

## 2019-05-21 DIAGNOSIS — Z79899 Other long term (current) drug therapy: Secondary | ICD-10-CM | POA: Diagnosis not present

## 2019-05-21 DIAGNOSIS — I10 Essential (primary) hypertension: Secondary | ICD-10-CM | POA: Diagnosis not present

## 2019-05-21 MED ORDER — OXYCODONE HCL 5 MG PO TABS: 5.0000 mg | ORAL_TABLET | Freq: Four times a day (QID) | ORAL | 0 refills | Status: DC | PRN

## 2019-05-21 MED ORDER — ONDANSETRON 4 MG PO TBDP: 4.0000 mg | ORAL_TABLET | Freq: Three times a day (TID) | ORAL | 0 refills | Status: DC | PRN

## 2019-05-21 MED ORDER — METHOCARBAMOL 500 MG PO TABS: 500.0000 mg | ORAL_TABLET | Freq: Four times a day (QID) | ORAL | 0 refills | Status: DC | PRN

## 2019-05-21 NOTE — Progress Notes (Signed)
Provided discharge education/instructions, all questions and concerns addressed, Pt not in distress, discharged home accompanied by family member.

## 2019-05-21 NOTE — Plan of Care (Signed)
  Problem: Education: Goal: Knowledge of the prescribed therapeutic regimen will improve Outcome: Progressing   Problem: Activity: Goal: Ability to tolerate increased activity will improve Outcome: Progressing   Problem: Pain Management: Goal: Pain level will decrease with appropriate interventions Outcome: Progressing   

## 2019-05-21 NOTE — Progress Notes (Signed)
   Subjective:  Patient reports pain as mild.  Doing well this am, no real pain.  Block is wearing off.  Objective:   VITALS:   Vitals:   05/20/19 2030 05/21/19 0033 05/21/19 0313 05/21/19 0824  BP: 116/70 (!) 104/91 114/64 111/62  Pulse: 84 89 78 71  Resp: 15 16 16 16   Temp: 98.2 F (36.8 C) 98.5 F (36.9 C) 98.5 F (36.9 C) 98.5 F (36.9 C)  TempSrc: Oral Oral Oral Oral  SpO2: 97% 96% 98% 100%  Weight:      Height:        Neurologically intact Neurovascular intact Sensation intact distally Intact pulses distally Incision: dressing C/D/I Compartment soft   Lab Results  Component Value Date   WBC 10.6 (H) 05/08/2019   HGB 11.3 (L) 05/08/2019   HCT 33.8 (L) 05/08/2019   MCV 94.7 05/08/2019   PLT 152 05/08/2019   BMET    Component Value Date/Time   NA 128 (L) 05/20/2019 1019   K 4.0 05/20/2019 1019   CL 96 (L) 05/20/2019 1019   CO2 23 05/20/2019 1019   GLUCOSE 127 (H) 05/20/2019 1019   BUN 10 05/20/2019 1019   CREATININE 0.94 05/20/2019 1019   CREATININE 0.98 05/06/2019 0837   CALCIUM 8.7 (L) 05/20/2019 1019   GFRNONAA >60 05/20/2019 1019   GFRNONAA >60 05/06/2019 0837   GFRAA >60 05/20/2019 1019   GFRAA >60 05/06/2019 0837     Assessment/Plan: 1 Day Post-Op   Active Problems:   Closed displaced spiral fracture of shaft of left humerus   Up with therapy - cleared OT -dc home today - sling and NWB to LUE   Nicholes Stairs 05/21/2019, 11:19 AM   Geralynn Rile, MD [phone]

## 2019-05-21 NOTE — Evaluation (Signed)
Occupational Therapy Evaluation Patient Details Name: Jesse Pope MRN: [id number] DOB: 1951-05-12 Today's Date: 05/21/2019    History of Present Illness Jesse Pope is a  68 y.o. male with a diagnosis of Left humerus fracture who elected for surgical management ORIF   Clinical Impression   Pt is at min A level with bathing and dressing due to immobilized L UW, NWB. Pt independent with mobility and transfers. 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 Pt educated/instructed on sling wear/shoulder protocol with handout provided, L elbow/wrist and hand ROM to tolerance, shoulder flexion 0-90 degrees and L UE ER 0-30 degrees as allowed per MD with handouts provided. Pt able to verbalize and return demo understanding. All education completed and no further acute OT is indicated at this time    Follow Up Recommendations  Follow surgeon's recommendation for DC plan and follow-up therapies;Outpatient OT    Equipment Recommendations  None recommended by OT    Recommendations for Other Services       Precautions / Restrictions Precautions Precautions: Shoulder Shoulder Interventions: Shoulder sling/immobilizer;At all times;Off for dressing/bathing/exercises Required Braces or Orthoses: Sling Restrictions Weight Bearing Restrictions: Yes LUE Weight Bearing: Non weight bearing      Mobility Bed Mobility Overal bed mobility: Independent                Transfers Overall transfer level: Independent                    Balance Overall balance assessment: No apparent balance deficits (not formally assessed)                                         ADL either performed or assessed with clinical judgement   ADL Overall ADL's : Needs assistance/impaired Eating/Feeding: Independent;Sitting Eating/Feeding  Details (indicate cue type and reason): some set up Grooming: Wash/dry hands;Wash/dry face;Independent;Standing   Upper Body Bathing: Minimal assistance   Lower Body Bathing: Minimal assistance   Upper Body Dressing : Minimal assistance   Lower Body Dressing: Minimal assistance   Toilet Transfer: Independent;Ambulation;Regular Museum/gallery exhibitions officer and Hygiene: Modified independent   Scientist, research (medical): Independent   Functional mobility during ADLs: Independent General ADL Comments: pt educated on compensatory ADL techniques     Vision Baseline Vision/History: Wears glasses Patient Visual Report: No change from baseline       Perception     Praxis      Pertinent Vitals/Pain Pain Assessment: 0-10 Pain Score: 4  Pain Location: during L UE exercises as allowed per surgeon/MD Pain Descriptors / Indicators: Grimacing;Guarding Pain Intervention(s): Monitored during session;Repositioned     Hand Dominance Right   Extremity/Trunk Assessment Upper Extremity Assessment Upper Extremity Assessment: LUE deficits/detail LUE Deficits / Details: sling, NWB LUE: Unable to fully assess due to immobilization       Cervical / Trunk Assessment Cervical / Trunk Assessment: Normal   Communication Communication Communication: No difficulties   Cognition Arousal/Alertness: Awake/alert Behavior During Therapy: WFL for tasks assessed/performed Overall Cognitive Status: Within Functional Limits for tasks assessed  General Comments       Exercises Shoulder Exercises Shoulder Flexion: Self ROM;Left;5 reps;Standing(0-90 degrees) Shoulder External Rotation: Self ROM;Left;5 reps;Standing Elbow Flexion: AROM;Left;5 reps;Standing Elbow Extension: AROM;Left;5 reps;Standing Wrist Flexion: AROM;Left;Standing Wrist Extension: AROM;Left;5 reps;Standing Digit Composite Flexion: AROM;Left;5 reps Composite Extension:  AROM;Left;5 reps Neck Flexion: AROM;5 reps Neck Extension: AROM;5 reps Neck Lateral Flexion - Right: AROM;5 reps Neck Lateral Flexion - Left: AROM;5 reps   Shoulder Instructions Shoulder Instructions Donning/doffing shirt without moving shoulder: Minimal assistance Method for sponge bathing under operated UE: Supervision/safety Donning/doffing sling/immobilizer: Minimal assistance Correct positioning of sling/immobilizer: Supervision/safety ROM for elbow, wrist and digits of operated UE: Independent Sling wearing schedule (on at all times/off for ADL's): Independent Proper positioning of operated UE when showering: Independent Positioning of UE while sleeping: [place] expects to be discharged to:: Private residence Living Arrangements: Alone Available Help at Discharge: Family;Friend(s);Available 24 hours/day Type of Home: House Home Access: Stairs to enter [employer] of Steps: 1   Home Layout: Two level;Able to live on main level with bedroom/bathroom;Laundry or work area in basement     [employer]: Occupational psychologist: [place]: [employer] - single point          Prior Functioning/Environment Level of Independence: Independent with assistive device(s)    ADL's / Homemaking Assistance Needed: independent with ADLs/selfcare, home mgt, was driving and still works as a Development worker, community   Comments: pt reports he uses cane sometimes        OT Problem List: Decreased strength;Pain;Decreased knowledge of precautions;Impaired UE functional use;Decreased knowledge of use of DME or AE      OT Treatment/Interventions:      OT Goals(Current goals can be found in the care plan section) Acute Rehab OT Goals Patient Stated Goal: go home today OT Goal Formulation: With patient  OT Frequency:     Barriers to D/C:    no barriers       Co-evaluation              AM-PAC OT "6 Clicks" Daily  Activity     Outcome Measure Help from another person eating meals?: None Help from another person taking care of personal grooming?: None Help from another person toileting, which includes using toliet, bedpan, or urinal?: None Help from another person bathing (including washing, rinsing, drying)?: A Little Help from another person to put on and taking off regular upper body clothing?: A Little Help from another person to put on and taking off regular lower body clothing?: A Little 6 Click Score: 21   End of Session Equipment Utilized During Treatment: Other (comment)(L UE sling) Nurse Communication: Other (comment)(eval completed)  Activity Tolerance: Patient tolerated treatment well Patient left: in bed(sitting EOB)  OT Visit Diagnosis: Muscle weakness (generalized) (M62.81);History of falling (Z91.81);Pain Pain - Right/Left: Left Pain - part of body: Shoulder;Arm                Time: [id number] OT Time Calculation (min): 39 min Charges:  OT General Charges $OT Visit: 1 Visit OT Evaluation $OT Eval Low Complexity: 1 Low OT Treatments $Self Care/Home Management : 8-22 mins $Therapeutic Activity: 8-22 mins    Britt Bottom 05/21/2019, 10:45 AM

## 2019-05-21 NOTE — Anesthesia Postprocedure Evaluation (Signed)
Anesthesia Post Note  Patient: Jesse Pope  Procedure(s) Performed: OPEN REDUCTION INTERNAL FIXATION (ORIF) HUMERUS FRACTURE (Left Arm Upper)     Patient location during evaluation: PACU Anesthesia Type: General Level of consciousness: awake and alert Pain management: pain level controlled Vital Signs Assessment: post-procedure vital signs reviewed and stable Respiratory status: spontaneous breathing, nonlabored ventilation, respiratory function stable and patient connected to nasal cannula oxygen Cardiovascular status: blood pressure returned to baseline and stable Postop Assessment: no apparent nausea or vomiting Anesthetic complications: no    Last Vitals:  Vitals:   05/21/19 0313 05/21/19 0824  BP: 114/64 111/62  Pulse: 78 71  Resp: 16 16  Temp: 36.9 C 36.9 C  SpO2: 98% 100%    Last Pain:  Vitals:   05/21/19 0834  TempSrc:   PainSc: 0-No pain                 Effie Berkshire

## 2019-05-25 ENCOUNTER — Encounter (HOSPITAL_COMMUNITY): Payer: Self-pay | Admitting: Orthopedic Surgery

## 2019-05-28 DIAGNOSIS — Z4889 Encounter for other specified surgical aftercare: Secondary | ICD-10-CM | POA: Diagnosis not present

## 2019-05-28 NOTE — Discharge Summary (Signed)
Patient ID: Jesse Pope MRN: [id number] DOB/AGE: 03/01/1951 68 y.o.  Admit date: 05/20/2019 Discharge date: 05/21/2019  Primary Diagnosis: Left humerus fracture  Admission Diagnoses:  Past Medical History:  Diagnosis Date  . Alcohol abuse 09/07/2015  . Arthritis   . Basal cell carcinoma of right lower leg   . Cellulitis and abscess of leg 09/07/2015  . Cigarette smoker 02/11/2014  . Eczema 02/11/2014  . Hypertension   . Hyponatremia 02/08/2014  . MRSA (methicillin resistant Staphylococcus aureus)   . Nocturia associated with benign prostatic hypertrophy 09/07/2015  . Pneumonia   . Prostate cancer ([place])    radiation  . Pulmonary nodules 09/10/2015   Discharge Diagnoses:   Active Problems:   Closed displaced spiral fracture of shaft of left humerus  Estimated body mass index is 24.39 kg/m as calculated from the following:   Height as of this encounter: 5\' 10"  (1.778 m).   Weight as of this encounter: 77.1 kg.  Procedure:  Procedure(s) (LRB): OPEN REDUCTION INTERNAL FIXATION (ORIF) HUMERUS FRACTURE (Left)   Consults: None  HPI: Jesse Pope was admitted for open reduction internal fixation of a long spiral proximal humerus fracture treated with internal plate and screws.  He was admitted to the floor postoperatively for routine perioperative care. Laboratory Data: Admission on 05/20/2019, Discharged on 05/21/2019  Component Date Value Ref Range Status  . Sodium 05/20/2019 128* 135 - 145 mmol/L Final  . Potassium 05/20/2019 4.0  3.5 - 5.1 mmol/L Final  . Chloride 05/20/2019 96* 98 - 111 mmol/L Final  . CO2 05/20/2019 23  22 - 32 mmol/L Final  . Glucose, Bld 05/20/2019 127* 70 - 99 mg/dL Final  . BUN 05/20/2019 10  8 - 23 mg/dL Final  . Creatinine, Ser 05/20/2019 0.94  0.61 - 1.24 mg/dL Final  . Calcium 05/20/2019 8.7* 8.9 - 10.3 mg/dL Final  . GFR calc non Af Amer 05/20/2019 >60  >60 mL/min Final  . GFR calc Af Amer 05/20/2019 >60  >60 mL/min Final  . Anion gap  05/20/2019 9  5 - 15 Final   Performed at [facility], [place] [address] [place], [place] [address]  . MRSA, PCR 05/20/2019 NEGATIVE  NEGATIVE Final  . Staphylococcus aureus 05/20/2019 NEGATIVE  NEGATIVE Final   Comment: (NOTE) The Xpert SA Assay (FDA approved for NASAL specimens in patients 69 years of age and older), is one component of a comprehensive surveillance program. It is not intended to diagnose infection nor to guide or monitor treatment. Performed at [facility], [place] [address], [place], [place] [address]   Hospital Outpatient Visit on 05/18/2019  Component Date Value Ref Range Status  . SARS Coronavirus 2 05/18/2019 NEGATIVE  NEGATIVE Final   Comment: (NOTE) SARS-CoV-2 target nucleic acids are NOT DETECTED. The SARS-CoV-2 RNA is generally detectable in upper and lower respiratory specimens during the acute phase of infection. Negative results do not preclude SARS-CoV-2 infection, do not rule out co-infections with other pathogens, and should not be used as the sole basis for treatment or other patient management decisions. Negative results must be combined with clinical observations, patient history, and epidemiological information. The expected result is Negative. Fact Sheet for Patients: SugarRoll.be Fact Sheet for Healthcare Providers: https://www.woods-mathews.com/ This test is not yet approved or cleared by the [place] FDA and  has been authorized for detection and/or diagnosis of SARS-CoV-2 by FDA under an Emergency Use Authorization (EUA). This EUA will remain  in effect (meaning this test  can be used) for the duration of the COVID-19 declaration under Section 56                          4(b)(1) of the Act, 21 U.S.C. section 360bbb-3(b)(1), unless the authorization is terminated or revoked sooner. Performed at [facility], [place] [address] [place], [place] [address]   Admission on 05/08/2019,  Discharged on 05/08/2019  Component Date Value Ref Range Status  . WBC 05/08/2019 10.6* 4.0 - 10.5 K/uL Final  . RBC 05/08/2019 3.57* 4.22 - 5.81 MIL/uL Final  . Hemoglobin 05/08/2019 11.3* 13.0 - 17.0 g/dL Final  . HCT 05/08/2019 33.8* 39.0 - 52.0 % Final  . MCV 05/08/2019 94.7  80.0 - 100.0 fL Final  . MCH 05/08/2019 31.7  26.0 - 34.0 pg Final  . MCHC 05/08/2019 33.4  30.0 - 36.0 g/dL Final  . RDW 05/08/2019 12.8  11.5 - 15.5 % Final  . Platelets 05/08/2019 152  150 - 400 K/uL Final  . nRBC 05/08/2019 0.0  0.0 - 0.2 % Final   Performed at [facility], [place] [address] [place], [place] [address]  . Sodium 05/08/2019 126* 135 - 145 mmol/L Final  . Potassium 05/08/2019 4.0  3.5 - 5.1 mmol/L Final  . Chloride 05/08/2019 99  98 - 111 mmol/L Final  . CO2 05/08/2019 21* 22 - 32 mmol/L Final  . Glucose, Bld 05/08/2019 110* 70 - 99 mg/dL Final  . BUN 05/08/2019 11  8 - 23 mg/dL Final  . Creatinine, Ser 05/08/2019 0.95  0.61 - 1.24 mg/dL Final  . Calcium 05/08/2019 7.1* 8.9 - 10.3 mg/dL Final  . GFR calc non Af Amer 05/08/2019 >60  >60 mL/min Final  . GFR calc Af Amer 05/08/2019 >60  >60 mL/min Final  . Anion gap 05/08/2019 6  5 - 15 Final   Performed at [facility], [place] [address], [place], [place] [address]  Appointment on 05/06/2019  Component Date Value Ref Range Status  . LDH 05/06/2019 147  98 - 192 U/L Final   Performed at [facility], [place] [address] [place], [place] [address]  . Smear Review 05/06/2019 SMEAR STAINED AND AVAILABLE FOR REVIEW   Final   Performed at [facility], [address] [place], [place] [address]  . Sodium 05/06/2019 131* 135 - 145 mmol/L Final  . Potassium 05/06/2019 4.3  3.5 - 5.1 mmol/L Final  . Chloride 05/06/2019 97* 98 - 111 mmol/L Final  . CO2 05/06/2019 23  22 - 32 mmol/L Final  . Glucose, Bld 05/06/2019 106* 70 - 99 mg/dL Final  . BUN 05/06/2019 9  8 - 23  mg/dL Final  . Creatinine 05/06/2019 0.98  0.61 - 1.24 mg/dL Final  . Calcium 05/06/2019 9.1  8.9 - 10.3 mg/dL Final  . Total Protein 05/06/2019 7.5  6.5 - 8.1 g/dL Final  . Albumin 05/06/2019 4.1  3.5 - 5.0 g/dL Final  . AST 05/06/2019 14* 15 - 41 U/L Final  . ALT 05/06/2019 13  0 - 44 U/L Final  . Alkaline Phosphatase 05/06/2019 55  38 - 126 U/L Final  . Total Bilirubin 05/06/2019 1.0  0.3 - 1.2 mg/dL Final  . GFR, Est Non Af Am 05/06/2019 >60  >60 mL/min Final  . GFR, Est AFR Am 05/06/2019 >60  >60 mL/min Final  . Anion gap 05/06/2019 11  5 - 15 Final  Performed at [facility], [place] [address] [place], [place] [address]  . WBC Count 05/06/2019 7.1  4.0 - 10.5 K/uL Final  . RBC 05/06/2019 4.35  4.22 - 5.81 MIL/uL Final  . Hemoglobin 05/06/2019 13.5  13.0 - 17.0 g/dL Final  . HCT 05/06/2019 40.0  39.0 - 52.0 % Final  . MCV 05/06/2019 92.0  80.0 - 100.0 fL Final  . MCH 05/06/2019 31.0  26.0 - 34.0 pg Final  . MCHC 05/06/2019 33.8  30.0 - 36.0 g/dL Final  . RDW 05/06/2019 12.6  11.5 - 15.5 % Final  . Platelet Count 05/06/2019 279  150 - 400 K/uL Final  . nRBC 05/06/2019 0.0  0.0 - 0.2 % Final  . Neutrophils Relative % 05/06/2019 67  % Final  . Neutro Abs 05/06/2019 4.6  1.7 - 7.7 K/uL Final  . Lymphocytes Relative 05/06/2019 15  % Final  . Lymphs Abs 05/06/2019 1.1  0.7 - 4.0 K/uL Final  . Monocytes Relative 05/06/2019 15  % Final  . Monocytes Absolute 05/06/2019 1.1* 0.1 - 1.0 K/uL Final  . Eosinophils Relative 05/06/2019 2  % Final  . Eosinophils Absolute 05/06/2019 0.2  0.0 - 0.5 K/uL Final  . Basophils Relative 05/06/2019 1  % Final  . Basophils Absolute 05/06/2019 0.1  0.0 - 0.1 K/uL Final  . Immature Granulocytes 05/06/2019 0  % Final  . Abs Immature Granulocytes 05/06/2019 0.03  0.00 - 0.07 K/uL Final   Performed at [facility], [place] [address], [place], [place] [address]  Appointment on 03/30/2019  Component Date Value  Ref Range Status  . LDH 03/30/2019 139  98 - 192 U/L Final   Performed at [facility], [place] [address], [place], [place] [address]  . Smear Review 03/30/2019 SMEAR STAINED AND AVAILABLE FOR REVIEW   Final   Performed at [facility], [address], [place], [place] [address]  . Sodium 03/30/2019 131* 135 - 145 mmol/L Final  . Potassium 03/30/2019 4.9  3.5 - 5.1 mmol/L Final  . Chloride 03/30/2019 98  98 - 111 mmol/L Final  . CO2 03/30/2019 27  22 - 32 mmol/L Final  . Glucose, Bld 03/30/2019 112* 70 - 99 mg/dL Final  . BUN 03/30/2019 9  8 - 23 mg/dL Final  . Creatinine 03/30/2019 1.06  0.61 - 1.24 mg/dL Final  . Calcium 03/30/2019 8.7* 8.9 - 10.3 mg/dL Final  . Total Protein 03/30/2019 7.0  6.5 - 8.1 g/dL Final  . Albumin 03/30/2019 3.9  3.5 - 5.0 g/dL Final  . AST 03/30/2019 16  15 - 41 U/L Final  . ALT 03/30/2019 12  0 - 44 U/L Final  . Alkaline Phosphatase 03/30/2019 61  38 - 126 U/L Final  . Total Bilirubin 03/30/2019 1.0  0.3 - 1.2 mg/dL Final  . GFR, Est Non Af Am 03/30/2019 >60  >60 mL/min Final  . GFR, Est AFR Am 03/30/2019 >60  >60 mL/min Final  . Anion gap 03/30/2019 6  5 - 15 Final   Performed at [facility], [place] [address] [place],  [address]  . WBC Count 03/30/2019 8.9  4.0 - 10.5 K/uL Final  . RBC 03/30/2019 4.04* 4.22 - 5.81 MIL/uL Final  . Hemoglobin 03/30/2019 12.6* 13.0 - 17.0 g/dL Final  . HCT 03/30/2019 37.7* 39.0 - 52.0 % Final  . MCV 03/30/2019 93.3  80.0 - 100.0 fL Final  .  MCH 03/30/2019 31.2  26.0 - 34.0 pg Final  . MCHC 03/30/2019 33.4  30.0 - 36.0 g/dL Final  . RDW 03/30/2019 13.2  11.5 - 15.5 % Final  . Platelet Count 03/30/2019 268  150 - 400 K/uL Final  . nRBC 03/30/2019 0.0  0.0 - 0.2 % Final  . Neutrophils Relative % 03/30/2019 71  % Final  . Neutro Abs 03/30/2019 6.3  1.7 - 7.7 K/uL Final  . Lymphocytes Relative 03/30/2019 12  % Final  . Lymphs Abs 03/30/2019 1.1   0.7 - 4.0 K/uL Final  . Monocytes Relative 03/30/2019 14  % Final  . Monocytes Absolute 03/30/2019 1.2* 0.1 - 1.0 K/uL Final  . Eosinophils Relative 03/30/2019 2  % Final  . Eosinophils Absolute 03/30/2019 0.2  0.0 - 0.5 K/uL Final  . Basophils Relative 03/30/2019 1  % Final  . Basophils Absolute 03/30/2019 0.1  0.0 - 0.1 K/uL Final  . Immature Granulocytes 03/30/2019 0  % Final  . Abs Immature Granulocytes 03/30/2019 0.04  0.00 - 0.07 K/uL Final   Performed at [facility], [place] [address] [place], [place] [address]     X-Rays:Dg Chest Portable 1 View  Result Date: 05/08/2019 CLINICAL DATA:  Acute pain following fall.  Initial encounter. EXAM: PORTABLE CHEST 1 VIEW COMPARISON:  09/07/2015 chest radiograph FINDINGS: The cardiomediastinal silhouette is unremarkable. There is no evidence of focal airspace disease, pulmonary edema, suspicious pulmonary nodule/mass, pleural effusion, or pneumothorax. No acute bony abnormalities are identified. IMPRESSION: No active disease. Electronically Signed   By: Margarette Canada M.D.   On: 05/08/2019 10:55   Dg Humerus Left  Result Date: 05/20/2019 CLINICAL DATA:  Left humeral shaft fracture fixation. EXAM: LEFT HUMERUS - 2+ VIEW; DG C-ARM 1-60 MIN COMPARISON:  05/08/2019 FINDINGS: Multiple intraoperative fluoroscopic spot images demonstrate open reduction and internal fixation of a long spiral type humeral shaft fracture. There is a long lateral sideplate and compression screws with anatomic reduction of the fracture and no complicating features. IMPRESSION: Open reduction and internal fixation of the humeral shaft fracture with anatomic alignment and no complicating features. Electronically Signed   By: Marijo Sanes M.D.   On: 05/20/2019 14:46   Dg Humerus Left  Result Date: 05/08/2019 CLINICAL DATA:  Pt fell through floor at work this a.m and tried to catch self with lt hand landing on lt side, complains of severe lt humerus pain, no  chest complaints EXAM: LEFT HUMERUS - 2+ VIEW COMPARISON:  None. FINDINGS: There is a spiral displaced non comminuted fracture of the left humeral diaphysis. Fracture extends from the proximal to the mid diaphysis. The distal fracture component has displaced anteromedially by 3 cm. The fracture is mildly foreshortened by 1 to 1.5 cm. There is mild angulation between the proximal and distal fracture components of approximately 15 degrees. No other fractures. The elbow and shoulder joints are normally aligned. There is surrounding soft tissue swelling IMPRESSION: Displaced mildly angulated non comminuted spiral fracture of the left humeral diaphysis. Electronically Signed   By: Lajean Manes M.D.   On: 05/08/2019 10:57   Dg C-arm 1-60 Min  Result Date: 05/20/2019 CLINICAL DATA:  Left humeral shaft fracture fixation. EXAM: LEFT HUMERUS - 2+ VIEW; DG C-ARM 1-60 MIN COMPARISON:  05/08/2019 FINDINGS: Multiple intraoperative fluoroscopic spot images demonstrate open reduction and internal fixation of a long spiral type humeral shaft fracture. There is a long lateral sideplate and compression screws with anatomic reduction of the fracture and no  complicating features. IMPRESSION: Open reduction and internal fixation of the humeral shaft fracture with anatomic alignment and no complicating features. Electronically Signed   By: Marijo Sanes M.D.   On: 05/20/2019 14:46    EKG: Orders placed or performed during the hospital encounter of 09/07/15  . ED EKG  . ED EKG  . EKG 12-Lead  . EKG 12-Lead     Hospital Course: Jesse Pope is a 68 y.o. who was admitted to Hospital. They were brought to the operating room on 05/20/2019 and underwent Procedure(s): OPEN REDUCTION INTERNAL FIXATION (ORIF) HUMERUS FRACTURE.  Patient tolerated the procedure well and was later transferred to the recovery room and then to the orthopaedic floor for postoperative care.  They were given PO and IV analgesics for pain control  following their surgery.  They were given 24 hours of postoperative antibiotics of  Anti-infectives (From admission, onward)   Start     Dose/Rate Route Frequency Ordered Stop   05/20/19 1900  ceFAZolin (ANCEF) IVPB 2g/100 mL premix     2 g 200 mL/hr over 30 Minutes Intravenous Every 6 hours 05/20/19 1655 05/21/19 0632   05/20/19 1410  vancomycin (VANCOCIN) powder  Status:  Discontinued       As needed 05/20/19 1410 05/20/19 1449     and started on DVT prophylaxis in the form of Aspirin.  OT were ordered for.   By day one, the patient had progressed with therapy and meeting their goals.  Incision was healing well.  Patient was seen in rounds and was ready to go home.   Diet: Regular diet Activity:NWB Follow-up:in 2 weeks Disposition - Home Discharged Condition: good   Discharge Instructions    Call MD / Call 911   Complete by: As directed    If you experience chest pain or shortness of breath, CALL 911 and be transported to the hospital emergency room.  If you develope a fever above 101 F, pus (white drainage) or increased drainage or redness at the wound, or calf pain, call your surgeon's office.   Constipation Prevention   Complete by: As directed    Drink plenty of fluids.  Prune juice may be helpful.  You may use a stool softener, such as Colace (over the counter) 100 mg twice a day.  Use MiraLax (over the counter) for constipation as needed.   Diet - low sodium heart healthy   Complete by: As directed    Increase activity slowly as tolerated   Complete by: As directed      Allergies as of 05/21/2019                  Medication List    TAKE these medications   amLODipine 10 MG tablet Commonly known as: NORVASC Take 10 mg by mouth daily.   diphenhydrAMINE 25 MG tablet Commonly known as: BENADRYL Take 25 mg by mouth every morning. Notes to patient: Resume home regimen   HYDROcodone-acetaminophen 5-325 MG tablet Commonly known as: NORCO/VICODIN Take 1 tablet by  mouth every 6 (six) hours as needed. Notes to patient: Resume home regimen   loperamide 1 MG/5ML solution Commonly known as: IMODIUM Take 4 mg by mouth as needed for diarrhea or loose stools. Notes to patient: Resume home regimen   methocarbamol 500 MG tablet Commonly known as: Robaxin Take 1 tablet (500 mg total) by mouth every 6 (six) hours as needed for muscle spasms. What changed: Another medication with the same name was added. Make  sure you understand how and when to take each. Notes to patient: Resume home regimen   methocarbamol 500 MG tablet Commonly known as: Robaxin Take 1 tablet (500 mg total) by mouth every 6 (six) hours as needed for muscle spasms. What changed: You were already taking a medication with the same name, and this prescription was added. Make sure you understand how and when to take each.   ondansetron 4 MG disintegrating tablet Commonly known as: Zofran ODT Take 1 tablet (4 mg total) by mouth every 8 (eight) hours as needed.   oxyCODONE 5 MG immediate release tablet Commonly known as: Roxicodone Take 1 tablet (5 mg total) by mouth every 6 (six) hours as needed for severe pain or breakthrough pain.   ramipril 10 MG capsule Commonly known as: ALTACE Take 10 mg by mouth daily.      Follow-up Information    Nicholes Stairs, MD In 2 weeks.   Specialty: Orthopedic Surgery Why: For suture removal, For wound re-check Contact information: [address] [place] [address] [place] [address] [id number]           Signed: Geralynn Rile, MD Orthopaedic Surgery 05/28/2019, 5:03 PM

## 2019-06-23 ENCOUNTER — Encounter: Payer: Self-pay | Admitting: Plastic Surgery

## 2019-06-23 ENCOUNTER — Other Ambulatory Visit: Payer: Self-pay

## 2019-06-23 ENCOUNTER — Ambulatory Visit (INDEPENDENT_AMBULATORY_CARE_PROVIDER_SITE_OTHER): Payer: Medicare HMO | Admitting: Plastic Surgery

## 2019-06-23 DIAGNOSIS — C44722 Squamous cell carcinoma of skin of right lower limb, including hip: Secondary | ICD-10-CM

## 2019-06-23 MED ORDER — HYDROCODONE-ACETAMINOPHEN 5-325 MG PO TABS: 1.0000 | ORAL_TABLET | Freq: Three times a day (TID) | ORAL | 0 refills | Status: AC | PRN

## 2019-06-23 MED ORDER — ONDANSETRON HCL 4 MG PO TABS: 4.0000 mg | ORAL_TABLET | Freq: Three times a day (TID) | ORAL | 0 refills | Status: AC | PRN

## 2019-06-23 MED ORDER — DOXYCYCLINE HYCLATE 100 MG PO TABS: 100.0000 mg | ORAL_TABLET | Freq: Two times a day (BID) | ORAL | 0 refills | Status: DC

## 2019-06-23 NOTE — Progress Notes (Signed)
Patient ID: Jesse Pope, male    DOB: 11-11-1950, 68 y.o.   MRN: [id number]   Chief Complaint  Patient presents with  . Skin Problem    The patient is a 68 year old white male here for his preoperative history and physical for his leg surgery.  He has multiple areas of squamous cell carcinoma and basal cell carcinoma on the skin of both lower legs.  The right is significantly worse than the left.  He was evaluated by vascular surgery and deemed to have adequate blood flow.  The majority of the lesions are on the right anterior leg.  He does smoke 2 cigars/day.  He is otherwise stable and doing well.  He does have a history of prostate cancer.  There is talk of doing radiation once the lesions have been excised.   Review of Systems  Constitutional: Positive for activity change. Negative for appetite change.  Eyes: Negative.   Respiratory: Negative.   Cardiovascular: Negative.   Gastrointestinal: Negative.   Genitourinary: Negative.   Musculoskeletal: Negative.   Skin: Positive for color change and wound.  Hematological: Negative.     Past Medical History:  Diagnosis Date  . Alcohol abuse 09/07/2015  . Arthritis   . Basal cell carcinoma of right lower leg   . Cellulitis and abscess of leg 09/07/2015  . Cigarette smoker 02/11/2014  . Eczema 02/11/2014  . Hypertension   . Hyponatremia 02/08/2014  . MRSA (methicillin resistant Staphylococcus aureus)   . Nocturia associated with benign prostatic hypertrophy 09/07/2015  . Pneumonia   . Prostate cancer ([place])    radiation  . Pulmonary nodules 09/10/2015    Past Surgical History:  Procedure Laterality Date  . BACK SURGERY     lower- disc  . Biospy of Right Lower Extremity  01/27/2014   SCC  . COLONOSCOPY W/ POLYPECTOMY    . EYE SURGERY Left    cataract  . Heel fracture     patient doesnt remember which one  . I&D EXTREMITY Left 02/10/2014   Procedure: IRRIGATION AND DEBRIDEMENT EXTREMITY/PRE PATELLA BURSA;  Surgeon: Marin Shutter, MD;  Location: [place];  Service: Orthopedics;  Laterality: Left;  . I&D EXTREMITY Left 02/15/2014   Procedure: IRRIGATION AND DEBRIDEMENT EXTREMITY/DELAY CLOSURE OF LEFT KNEE;  Surgeon: Marin Shutter, MD;  Location: [place];  Service: Orthopedics;  Laterality: Left;  . ORIF HUMERUS FRACTURE Left 05/20/2019   Procedure: OPEN REDUCTION INTERNAL FIXATION (ORIF) HUMERUS FRACTURE;  Surgeon: Nicholes Stairs, MD;  Location: [place];  Service: Orthopedics;  Laterality: Left;  120 mins  . RETINAL DETACHMENT SURGERY Left       Current Outpatient Medications:  .  amLODipine (NORVASC) 10 MG tablet, Take 10 mg by mouth daily., Disp: , Rfl:  .  diphenhydrAMINE (BENADRYL) 25 MG tablet, Take 25 mg by mouth every morning., Disp: , Rfl:  .  HYDROcodone-acetaminophen (NORCO/VICODIN) 5-325 MG tablet, Take 1 tablet by mouth every 6 (six) hours as needed., Disp: 20 tablet, Rfl: 0 .  loperamide (IMODIUM) 1 MG/5ML solution, Take 4 mg by mouth as needed for diarrhea or loose stools., Disp: , Rfl:  .  methocarbamol (ROBAXIN) 500 MG tablet, Take 1 tablet (500 mg total) by mouth every 6 (six) hours as needed for muscle spasms., Disp: 30 tablet, Rfl: 5 .  methocarbamol (ROBAXIN) 500 MG tablet, Take 1 tablet (500 mg total) by mouth every 6 (six) hours as needed for muscle spasms., Disp: 60 tablet, Rfl:  0 .  ondansetron (ZOFRAN ODT) 4 MG disintegrating tablet, Take 1 tablet (4 mg total) by mouth every 8 (eight) hours as needed., Disp: 20 tablet, Rfl: 0 .  oxyCODONE (ROXICODONE) 5 MG immediate release tablet, Take 1 tablet (5 mg total) by mouth every 6 (six) hours as needed for severe pain or breakthrough pain., Disp: 40 tablet, Rfl: 0 .  ramipril (ALTACE) 10 MG capsule, Take 10 mg by mouth daily., Disp: , Rfl:    Objective:   There were no vitals filed for this visit.  Physical Exam Vitals signs and nursing note reviewed.  Constitutional:      Appearance: Normal appearance.  HENT:     Head: Atraumatic.   Cardiovascular:     Rate and Rhythm: Normal rate.  Pulmonary:     Effort: Pulmonary effort is normal.  Neurological:     General: No focal deficit present.     Mental Status: He is alert and oriented to person, place, and time.  Psychiatric:        Mood and Affect: Mood normal.        Behavior: Behavior normal.     Assessment & Plan:  Squamous cell carcinoma of right lower leg  Plan for excision of extensive skin lesions of bilateral lower extremities. Prescription sent to pharmacy. The consent was obtained with risks and complications reviewed which included bleeding, pain, scar, infection and the risk of anesthesia.  He could have an extended.  Of wound and trouble healing.  The patients questions were answered to the patients expressed satisfaction.  I requested that he abstain from tobacco use.  [place], DO

## 2019-06-23 NOTE — H&P (View-Only) (Signed)
Patient ID: Jesse Pope, male    DOB: [birth year], 68 y.o.   MRN: [id number]   Chief Complaint  Patient presents with  . Skin Problem    The patient is a 68 year old white male here for his preoperative history and physical for his leg surgery.  He has multiple areas of squamous cell carcinoma and basal cell carcinoma on the skin of both lower legs.  The right is significantly worse than the left.  He was evaluated by vascular surgery and deemed to have adequate blood flow.  The majority of the lesions are on the right anterior leg.  He does smoke 2 cigars/day.  He is otherwise stable and doing well.  He does have a history of prostate cancer.  There is talk of doing radiation once the lesions have been excised.   Review of Systems  Constitutional: Positive for activity change. Negative for appetite change.  Eyes: Negative.   Respiratory: Negative.   Cardiovascular: Negative.   Gastrointestinal: Negative.   Genitourinary: Negative.   Musculoskeletal: Negative.   Skin: Positive for color change and wound.  Hematological: Negative.     Past Medical History:  Diagnosis Date  . Alcohol abuse 09/07/2015  . Arthritis   . Basal cell carcinoma of right lower leg   . Cellulitis and abscess of leg 09/07/2015  . Cigarette smoker 02/11/2014  . Eczema 02/11/2014  . Hypertension   . Hyponatremia 02/08/2014  . MRSA (methicillin resistant Staphylococcus aureus)   . Nocturia associated with benign prostatic hypertrophy 09/07/2015  . Pneumonia   . Prostate cancer ([place])    radiation  . Pulmonary nodules 09/10/2015    Past Surgical History:  Procedure Laterality Date  . BACK SURGERY     lower- disc  . Biospy of Right Lower Extremity  01/27/2014   SCC  . COLONOSCOPY W/ POLYPECTOMY    . EYE SURGERY Left    cataract  . Heel fracture     patient doesnt remember which one  . I&D EXTREMITY Left 02/10/2014   Procedure: IRRIGATION AND DEBRIDEMENT EXTREMITY/PRE PATELLA BURSA;  Surgeon: Marin Shutter, MD;  Location: [facility];  Service: Orthopedics;  Laterality: Left;  . I&D EXTREMITY Left 02/15/2014   Procedure: IRRIGATION AND DEBRIDEMENT EXTREMITY/DELAY CLOSURE OF LEFT KNEE;  Surgeon: Marin Shutter, MD;  Location: [facility];  Service: Orthopedics;  Laterality: Left;  . ORIF HUMERUS FRACTURE Left 05/20/2019   Procedure: OPEN REDUCTION INTERNAL FIXATION (ORIF) HUMERUS FRACTURE;  Surgeon: Nicholes Stairs, MD;  Location: [facility];  Service: Orthopedics;  Laterality: Left;  120 mins  . RETINAL DETACHMENT SURGERY Left       Current Outpatient Medications:  .  amLODipine (NORVASC) 10 MG tablet, Take 10 mg by mouth daily., Disp: , Rfl:  .  diphenhydrAMINE (BENADRYL) 25 MG tablet, Take 25 mg by mouth every morning., Disp: , Rfl:  .  HYDROcodone-acetaminophen (NORCO/VICODIN) 5-325 MG tablet, Take 1 tablet by mouth every 6 (six) hours as needed., Disp: 20 tablet, Rfl: 0 .  loperamide (IMODIUM) 1 MG/5ML solution, Take 4 mg by mouth as needed for diarrhea or loose stools., Disp: , Rfl:  .  methocarbamol (ROBAXIN) 500 MG tablet, Take 1 tablet (500 mg total) by mouth every 6 (six) hours as needed for muscle spasms., Disp: 30 tablet, Rfl: 5 .  methocarbamol (ROBAXIN) 500 MG tablet, Take 1 tablet (500 mg total) by mouth every 6 (six) hours as needed for muscle spasms., Disp: 60 tablet, Rfl:  0 .  ondansetron (ZOFRAN ODT) 4 MG disintegrating tablet, Take 1 tablet (4 mg total) by mouth every 8 (eight) hours as needed., Disp: 20 tablet, Rfl: 0 .  oxyCODONE (ROXICODONE) 5 MG immediate release tablet, Take 1 tablet (5 mg total) by mouth every 6 (six) hours as needed for severe pain or breakthrough pain., Disp: 40 tablet, Rfl: 0 .  ramipril (ALTACE) 10 MG capsule, Take 10 mg by mouth daily., Disp: , Rfl:    Objective:   There were no vitals filed for this visit.  Physical Exam Vitals signs and nursing note reviewed.  Constitutional:      Appearance: Normal appearance.  HENT:     Head: Atraumatic.    Cardiovascular:     Rate and Rhythm: Normal rate.  Pulmonary:     Effort: Pulmonary effort is normal.  Neurological:     General: No focal deficit present.     Mental Status: He is alert and oriented to person, place, and time.  Psychiatric:        Mood and Affect: Mood normal.        Behavior: Behavior normal.     Assessment & Plan:  Squamous cell carcinoma of right lower leg  Plan for excision of extensive skin lesions of bilateral lower extremities. Prescription sent to pharmacy. The consent was obtained with risks and complications reviewed which included bleeding, pain, scar, infection and the risk of anesthesia.  He could have an extended.  Of wound and trouble healing.  The patients questions were answered to the patients expressed satisfaction.  I requested that he abstain from tobacco use.  [place], DO

## 2019-06-30 DIAGNOSIS — M7022 Olecranon bursitis, left elbow: Secondary | ICD-10-CM | POA: Diagnosis not present

## 2019-06-30 DIAGNOSIS — Z4789 Encounter for other orthopedic aftercare: Secondary | ICD-10-CM | POA: Diagnosis not present

## 2019-07-15 ENCOUNTER — Other Ambulatory Visit: Payer: Self-pay

## 2019-07-15 ENCOUNTER — Encounter (HOSPITAL_BASED_OUTPATIENT_CLINIC_OR_DEPARTMENT_OTHER): Payer: Self-pay | Admitting: Plastic Surgery

## 2019-07-20 ENCOUNTER — Other Ambulatory Visit (HOSPITAL_COMMUNITY)
Admission: RE | Admit: 2019-07-20 | Discharge: 2019-07-20 | Disposition: A | Discharge status: Home / Self Care | Payer: Medicare HMO | Source: Ambulatory Visit | Attending: Plastic Surgery | Admitting: Plastic Surgery

## 2019-07-20 DIAGNOSIS — Z01812 Encounter for preprocedural laboratory examination: Secondary | ICD-10-CM | POA: Insufficient documentation

## 2019-07-20 DIAGNOSIS — Z20822 Contact with and (suspected) exposure to covid-19: Secondary | ICD-10-CM | POA: Insufficient documentation

## 2019-07-21 LAB — NOVEL CORONAVIRUS, NAA (HOSP ORDER, SEND-OUT TO REF LAB; TAT 18-24 HRS): SARS-CoV-2, NAA: NOT DETECTED

## 2019-07-22 ENCOUNTER — Other Ambulatory Visit: Payer: Self-pay

## 2019-07-22 ENCOUNTER — Encounter (HOSPITAL_BASED_OUTPATIENT_CLINIC_OR_DEPARTMENT_OTHER)
Admission: RE | Admit: 2019-07-22 | Discharge: 2019-07-22 | Disposition: A | Discharge status: Home / Self Care | Payer: Medicare HMO | Source: Ambulatory Visit | Attending: Plastic Surgery | Admitting: Plastic Surgery

## 2019-07-22 DIAGNOSIS — C44729 Squamous cell carcinoma of skin of left lower limb, including hip: Secondary | ICD-10-CM | POA: Diagnosis not present

## 2019-07-22 DIAGNOSIS — C44722 Squamous cell carcinoma of skin of right lower limb, including hip: Secondary | ICD-10-CM | POA: Diagnosis not present

## 2019-07-22 DIAGNOSIS — Z923 Personal history of irradiation: Secondary | ICD-10-CM | POA: Diagnosis not present

## 2019-07-22 DIAGNOSIS — I10 Essential (primary) hypertension: Secondary | ICD-10-CM | POA: Diagnosis not present

## 2019-07-22 DIAGNOSIS — M1990 Unspecified osteoarthritis, unspecified site: Secondary | ICD-10-CM | POA: Diagnosis not present

## 2019-07-22 DIAGNOSIS — Z79899 Other long term (current) drug therapy: Secondary | ICD-10-CM | POA: Diagnosis not present

## 2019-07-22 DIAGNOSIS — Z8546 Personal history of malignant neoplasm of prostate: Secondary | ICD-10-CM | POA: Diagnosis not present

## 2019-07-22 DIAGNOSIS — Z8614 Personal history of Methicillin resistant Staphylococcus aureus infection: Secondary | ICD-10-CM | POA: Diagnosis not present

## 2019-07-22 DIAGNOSIS — F17210 Nicotine dependence, cigarettes, uncomplicated: Secondary | ICD-10-CM | POA: Diagnosis not present

## 2019-07-22 NOTE — Anesthesia Preprocedure Evaluation (Signed)
Anesthesia Evaluation  Patient identified by MRN, date of birth, ID band Patient awake    Reviewed: Allergy & Precautions, NPO status , Patient's Chart, lab work & pertinent test results  Airway Mallampati: I  TM Distance: >3 FB Neck ROM: Full    Dental  (+) Edentulous Upper, Edentulous Lower   Pulmonary Current Smoker,    breath sounds clear to auscultation       Cardiovascular hypertension, Pt. on medications  Rhythm:Regular Rate:Normal     Neuro/Psych negative neurological ROS     GI/Hepatic negative GI ROS, Neg liver ROS,   Endo/Other  negative endocrine ROS  Renal/GU negative Renal ROS     Musculoskeletal  (+) Arthritis ,   Abdominal Normal abdominal exam  (+)   Peds  Hematology negative hematology ROS (+)   Anesthesia Other Findings   Reproductive/Obstetrics                             Anesthesia Physical  Anesthesia Plan  ASA: II  Anesthesia Plan: General   Post-op Pain Management:    Induction: Intravenous  PONV Risk Score and Plan: 2 and Ondansetron, Dexamethasone and Treatment may vary due to age or medical condition  Airway Management Planned: Oral ETT and LMA  Additional Equipment: None  Intra-op Plan:   Post-operative Plan: Extubation in OR  Informed Consent: I have reviewed the patients History and Physical, chart, labs and discussed the procedure including the risks, benefits and alternatives for the proposed anesthesia with the patient or authorized representative who has indicated his/her understanding and acceptance.       Plan Discussed with: CRNA  Anesthesia Plan Comments: (Hx of hyponatremia with baseline sodium ~130. Last labs at ED on 05/08/19 with sodium 126. Will recheck DOS.  Also with hx of borderline monocytosis - Most likely secondary to underlying malignancy, especially extensive SCCa of the skin which is followed by Dr. Maylon Peppers and Dr.  Marla Roe. )        Anesthesia Quick Evaluation

## 2019-07-23 ENCOUNTER — Ambulatory Visit (HOSPITAL_BASED_OUTPATIENT_CLINIC_OR_DEPARTMENT_OTHER): Payer: Medicare HMO | Admitting: Anesthesiology

## 2019-07-23 ENCOUNTER — Encounter (HOSPITAL_BASED_OUTPATIENT_CLINIC_OR_DEPARTMENT_OTHER)
Admission: RE | Disposition: A | Discharge status: Home / Self Care | Payer: Self-pay | Source: Home / Self Care | Attending: Plastic Surgery

## 2019-07-23 ENCOUNTER — Other Ambulatory Visit: Payer: Self-pay

## 2019-07-23 ENCOUNTER — Ambulatory Visit (HOSPITAL_BASED_OUTPATIENT_CLINIC_OR_DEPARTMENT_OTHER)
Admission: RE | Admit: 2019-07-23 | Discharge: 2019-07-23 | Disposition: A | Discharge status: Home / Self Care | Payer: Medicare HMO | Attending: Plastic Surgery | Admitting: Plastic Surgery

## 2019-07-23 ENCOUNTER — Encounter (HOSPITAL_BASED_OUTPATIENT_CLINIC_OR_DEPARTMENT_OTHER): Payer: Self-pay | Admitting: Plastic Surgery

## 2019-07-23 DIAGNOSIS — C44722 Squamous cell carcinoma of skin of right lower limb, including hip: Secondary | ICD-10-CM | POA: Diagnosis not present

## 2019-07-23 DIAGNOSIS — Z8546 Personal history of malignant neoplasm of prostate: Secondary | ICD-10-CM | POA: Insufficient documentation

## 2019-07-23 DIAGNOSIS — M1990 Unspecified osteoarthritis, unspecified site: Secondary | ICD-10-CM | POA: Insufficient documentation

## 2019-07-23 DIAGNOSIS — I10 Essential (primary) hypertension: Secondary | ICD-10-CM | POA: Insufficient documentation

## 2019-07-23 DIAGNOSIS — F17210 Nicotine dependence, cigarettes, uncomplicated: Secondary | ICD-10-CM | POA: Insufficient documentation

## 2019-07-23 DIAGNOSIS — Z79899 Other long term (current) drug therapy: Secondary | ICD-10-CM | POA: Insufficient documentation

## 2019-07-23 DIAGNOSIS — Z923 Personal history of irradiation: Secondary | ICD-10-CM | POA: Diagnosis not present

## 2019-07-23 DIAGNOSIS — C44729 Squamous cell carcinoma of skin of left lower limb, including hip: Secondary | ICD-10-CM | POA: Insufficient documentation

## 2019-07-23 DIAGNOSIS — Z8614 Personal history of Methicillin resistant Staphylococcus aureus infection: Secondary | ICD-10-CM | POA: Insufficient documentation

## 2019-07-23 DIAGNOSIS — L989 Disorder of the skin and subcutaneous tissue, unspecified: Secondary | ICD-10-CM | POA: Diagnosis not present

## 2019-07-23 DIAGNOSIS — S81809A Unspecified open wound, unspecified lower leg, initial encounter: Secondary | ICD-10-CM | POA: Diagnosis not present

## 2019-07-23 DIAGNOSIS — E871 Hypo-osmolality and hyponatremia: Secondary | ICD-10-CM | POA: Diagnosis not present

## 2019-07-23 HISTORY — PX: APPLICATION OF A-CELL OF EXTREMITY: SHX6303

## 2019-07-23 HISTORY — PX: LESION REMOVAL: SHX5196

## 2019-07-23 SURGERY — WIDE EXCISION, LESION, UPPER EXTREMITY
Anesthesia: General | Site: Leg Lower | Laterality: Right

## 2019-07-23 MED ORDER — PROPOFOL 10 MG/ML IV BOLUS
INTRAVENOUS | Status: AC
  Filled 2019-07-23: qty 20

## 2019-07-23 MED ORDER — FENTANYL CITRATE (PF) 100 MCG/2ML IJ SOLN
INTRAMUSCULAR | Status: AC
  Filled 2019-07-23: qty 2

## 2019-07-23 MED ORDER — ONDANSETRON HCL 4 MG/2ML IJ SOLN
INTRAMUSCULAR | Status: DC | PRN
  Administered 2019-07-23: 4 mg via INTRAVENOUS

## 2019-07-23 MED ORDER — SODIUM CHLORIDE 0.9% FLUSH: 3.0000 mL | Freq: Two times a day (BID) | INTRAVENOUS | Status: DC

## 2019-07-23 MED ORDER — SODIUM CHLORIDE 0.9% FLUSH: 3.0000 mL | INTRAVENOUS | Status: DC | PRN

## 2019-07-23 MED ORDER — LACTATED RINGERS IV SOLN: INTRAVENOUS | Status: DC

## 2019-07-23 MED ORDER — OXYCODONE HCL 5 MG PO TABS: 5.0000 mg | ORAL_TABLET | ORAL | Status: DC | PRN

## 2019-07-23 MED ORDER — PROPOFOL 10 MG/ML IV BOLUS
INTRAVENOUS | Status: DC | PRN
  Administered 2019-07-23: 200 mg via INTRAVENOUS

## 2019-07-23 MED ORDER — CIPROFLOXACIN IN D5W 400 MG/200ML IV SOLN
400.0000 mg | INTRAVENOUS | Status: AC
  Administered 2019-07-23: 400 mg via INTRAVENOUS

## 2019-07-23 MED ORDER — OXYCODONE HCL 5 MG/5ML PO SOLN: 5.0000 mg | Freq: Once | ORAL | Status: DC | PRN

## 2019-07-23 MED ORDER — KETOROLAC TROMETHAMINE 30 MG/ML IJ SOLN
INTRAMUSCULAR | Status: AC
  Filled 2019-07-23: qty 1

## 2019-07-23 MED ORDER — ONDANSETRON HCL 4 MG/2ML IJ SOLN: 4.0000 mg | Freq: Once | INTRAMUSCULAR | Status: DC | PRN

## 2019-07-23 MED ORDER — FENTANYL CITRATE (PF) 100 MCG/2ML IJ SOLN: 25.0000 ug | INTRAMUSCULAR | Status: DC | PRN

## 2019-07-23 MED ORDER — DEXAMETHASONE SODIUM PHOSPHATE 10 MG/ML IJ SOLN
INTRAMUSCULAR | Status: AC
  Filled 2019-07-23: qty 1

## 2019-07-23 MED ORDER — LIDOCAINE HCL (CARDIAC) PF 100 MG/5ML IV SOSY
PREFILLED_SYRINGE | INTRAVENOUS | Status: DC | PRN
  Administered 2019-07-23: 60 mg via INTRAVENOUS

## 2019-07-23 MED ORDER — ONDANSETRON HCL 4 MG/2ML IJ SOLN
INTRAMUSCULAR | Status: AC
  Filled 2019-07-23: qty 2

## 2019-07-23 MED ORDER — FENTANYL CITRATE (PF) 100 MCG/2ML IJ SOLN
INTRAMUSCULAR | Status: DC | PRN
  Administered 2019-07-23 (×2): 100 ug via INTRAVENOUS

## 2019-07-23 MED ORDER — ACETAMINOPHEN 650 MG RE SUPP: 650.0000 mg | RECTAL | Status: DC | PRN

## 2019-07-23 MED ORDER — EPHEDRINE 5 MG/ML INJ
INTRAVENOUS | Status: AC
  Filled 2019-07-23: qty 10

## 2019-07-23 MED ORDER — PHENYLEPHRINE 40 MCG/ML (10ML) SYRINGE FOR IV PUSH (FOR BLOOD PRESSURE SUPPORT)
PREFILLED_SYRINGE | INTRAVENOUS | Status: DC | PRN
  Administered 2019-07-23: 120 ug via INTRAVENOUS
  Administered 2019-07-23: 80 ug via INTRAVENOUS
  Administered 2019-07-23: 120 ug via INTRAVENOUS
  Administered 2019-07-23: 80 ug via INTRAVENOUS

## 2019-07-23 MED ORDER — EPHEDRINE SULFATE-NACL 50-0.9 MG/10ML-% IV SOSY
PREFILLED_SYRINGE | INTRAVENOUS | Status: DC | PRN
  Administered 2019-07-23 (×6): 5 mg via INTRAVENOUS

## 2019-07-23 MED ORDER — ACETAMINOPHEN 325 MG PO TABS: 650.0000 mg | ORAL_TABLET | ORAL | Status: DC | PRN

## 2019-07-23 MED ORDER — ACETAMINOPHEN 325 MG PO TABS: 325.0000 mg | ORAL_TABLET | ORAL | Status: DC | PRN

## 2019-07-23 MED ORDER — OXYCODONE HCL 5 MG PO TABS: 5.0000 mg | ORAL_TABLET | Freq: Once | ORAL | Status: DC | PRN

## 2019-07-23 MED ORDER — BACITRACIN ZINC 500 UNIT/GM EX OINT
TOPICAL_OINTMENT | CUTANEOUS | Status: AC
  Filled 2019-07-23: qty 28.35

## 2019-07-23 MED ORDER — LIDOCAINE-EPINEPHRINE 1 %-1:100000 IJ SOLN
INTRAMUSCULAR | Status: AC
  Filled 2019-07-23: qty 2

## 2019-07-23 MED ORDER — BUPIVACAINE HCL (PF) 0.25 % IJ SOLN
INTRAMUSCULAR | Status: AC
  Filled 2019-07-23: qty 120

## 2019-07-23 MED ORDER — CIPROFLOXACIN IN D5W 400 MG/200ML IV SOLN
INTRAVENOUS | Status: AC
  Filled 2019-07-23: qty 200

## 2019-07-23 MED ORDER — SODIUM CHLORIDE 0.9 % IV SOLN: 250.0000 mL | INTRAVENOUS | Status: DC | PRN

## 2019-07-23 MED ORDER — LIDOCAINE-EPINEPHRINE 1 %-1:100000 IJ SOLN
INTRAMUSCULAR | Status: DC | PRN
  Administered 2019-07-23: 20 mL

## 2019-07-23 MED ORDER — LIDOCAINE 2% (20 MG/ML) 5 ML SYRINGE
INTRAMUSCULAR | Status: AC
  Filled 2019-07-23: qty 5

## 2019-07-23 MED ORDER — DEXAMETHASONE SODIUM PHOSPHATE 10 MG/ML IJ SOLN
INTRAMUSCULAR | Status: DC | PRN
  Administered 2019-07-23: 7 mg via INTRAVENOUS

## 2019-07-23 MED ORDER — PHENYLEPHRINE 40 MCG/ML (10ML) SYRINGE FOR IV PUSH (FOR BLOOD PRESSURE SUPPORT)
PREFILLED_SYRINGE | INTRAVENOUS | Status: AC
  Filled 2019-07-23: qty 10

## 2019-07-23 MED ORDER — ACETAMINOPHEN 160 MG/5ML PO SOLN: 325.0000 mg | ORAL | Status: DC | PRN

## 2019-07-23 MED ORDER — CHLORHEXIDINE GLUCONATE CLOTH 2 % EX PADS: 6.0000 | MEDICATED_PAD | Freq: Once | CUTANEOUS | Status: DC

## 2019-07-23 MED ORDER — KETOROLAC TROMETHAMINE 30 MG/ML IJ SOLN
INTRAMUSCULAR | Status: DC | PRN
  Administered 2019-07-23: 15 mg via INTRAVENOUS

## 2019-07-23 MED ORDER — MEPERIDINE HCL 25 MG/ML IJ SOLN: 6.2500 mg | INTRAMUSCULAR | Status: DC | PRN

## 2019-07-23 SURGICAL SUPPLY — 87 items
BAG DECANTER FOR FLEXI CONT (MISCELLANEOUS) IMPLANT
BLADE CLIPPER SURG (BLADE) IMPLANT
BLADE HEX COATED 2.75 (ELECTRODE) IMPLANT
BLADE SURG 10 STRL SS (BLADE) ×3 IMPLANT
BLADE SURG 15 STRL LF DISP TIS (BLADE) ×2 IMPLANT
BLADE SURG 15 STRL SS (BLADE) ×1
BNDG COHESIVE 4X5 TAN STRL (GAUZE/BANDAGES/DRESSINGS) IMPLANT
BNDG CONFORM 2 STRL LF (GAUZE/BANDAGES/DRESSINGS) IMPLANT
BNDG ELASTIC 2X5.8 VLCR STR LF (GAUZE/BANDAGES/DRESSINGS) IMPLANT
BNDG ELASTIC 3X5.8 VLCR STR LF (GAUZE/BANDAGES/DRESSINGS) IMPLANT
BNDG ELASTIC 4X5.8 VLCR STR LF (GAUZE/BANDAGES/DRESSINGS) ×12 IMPLANT
BNDG ELASTIC 6X5.8 VLCR STR LF (GAUZE/BANDAGES/DRESSINGS) IMPLANT
BNDG GAUZE ELAST 4 BULKY (GAUZE/BANDAGES/DRESSINGS) ×12 IMPLANT
CANISTER SUCT 1200ML W/VALVE (MISCELLANEOUS) ×3 IMPLANT
CORD BIPOLAR FORCEPS 12FT (ELECTRODE) IMPLANT
COVER BACK TABLE 60X90IN (DRAPES) ×3 IMPLANT
COVER MAYO STAND STRL (DRAPES) ×3 IMPLANT
COVER WAND RF STERILE (DRAPES) IMPLANT
DECANTER SPIKE VIAL GLASS SM (MISCELLANEOUS) IMPLANT
DERMABOND ADVANCED (GAUZE/BANDAGES/DRESSINGS)
DERMABOND ADVANCED .7 DNX12 (GAUZE/BANDAGES/DRESSINGS) IMPLANT
DRAPE HALF SHEET 70X43 (DRAPES) ×3 IMPLANT
DRAPE INCISE IOBAN 66X45 STRL (DRAPES) IMPLANT
DRAPE LAPAROTOMY 100X72 PEDS (DRAPES) IMPLANT
DRAPE U-SHAPE 76X120 STRL (DRAPES) ×6 IMPLANT
DRSG ADAPTIC 3X8 NADH LF (GAUZE/BANDAGES/DRESSINGS) ×9 IMPLANT
DRSG EMULSION OIL 3X3 NADH (GAUZE/BANDAGES/DRESSINGS) IMPLANT
DRSG HYDROCOLLOID 4X4 (GAUZE/BANDAGES/DRESSINGS) IMPLANT
DRSG PAD ABDOMINAL 8X10 ST (GAUZE/BANDAGES/DRESSINGS) ×12 IMPLANT
DRSG TEGADERM 2-3/8X2-3/4 SM (GAUZE/BANDAGES/DRESSINGS) IMPLANT
ELECT COATED BLADE 2.86 ST (ELECTRODE) ×3 IMPLANT
ELECT NEEDLE BLADE 2-5/6 (NEEDLE) IMPLANT
ELECT REM PT RETURN 9FT ADLT (ELECTROSURGICAL) ×3
ELECT REM PT RETURN 9FT PED (ELECTROSURGICAL)
ELECTRODE REM PT RETRN 9FT PED (ELECTROSURGICAL) IMPLANT
ELECTRODE REM PT RTRN 9FT ADLT (ELECTROSURGICAL) ×2 IMPLANT
GAUZE SPONGE 4X4 12PLY STRL (GAUZE/BANDAGES/DRESSINGS) ×3 IMPLANT
GAUZE SPONGE 4X4 12PLY STRL LF (GAUZE/BANDAGES/DRESSINGS) IMPLANT
GAUZE XEROFORM 1X8 LF (GAUZE/BANDAGES/DRESSINGS) IMPLANT
GLOVE BIO SURGEON STRL SZ 6.5 (GLOVE) ×6 IMPLANT
GLOVE EXAM NITRILE MD LF STRL (GLOVE) ×3 IMPLANT
GOWN STRL REUS W/ TWL LRG LVL3 (GOWN DISPOSABLE) ×8 IMPLANT
GOWN STRL REUS W/TWL LRG LVL3 (GOWN DISPOSABLE) ×4
MATRIX TISSUE MESHED BI 4X10 (Tissue) ×3 IMPLANT
NEEDLE HYPO 25X1 1.5 SAFETY (NEEDLE) ×3 IMPLANT
NEEDLE HYPO 30GX1 BEV (NEEDLE) IMPLANT
NEEDLE PRECISIONGLIDE 27X1.5 (NEEDLE) IMPLANT
NS IRRIG 1000ML POUR BTL (IV SOLUTION) ×3 IMPLANT
PACK BASIN DAY SURGERY FS (CUSTOM PROCEDURE TRAY) ×3 IMPLANT
PADDING CAST ABS 3INX4YD NS (CAST SUPPLIES)
PADDING CAST ABS 4INX4YD NS (CAST SUPPLIES)
PADDING CAST ABS COTTON 3X4 (CAST SUPPLIES) IMPLANT
PADDING CAST ABS COTTON 4X4 ST (CAST SUPPLIES) IMPLANT
PENCIL SMOKE EVACUATOR (MISCELLANEOUS) ×3 IMPLANT
SLEEVE SCD COMPRESS KNEE MED (MISCELLANEOUS) IMPLANT
SPLINT FIBERGLASS 3X35 (CAST SUPPLIES) IMPLANT
SPLINT FIBERGLASS 4X30 (CAST SUPPLIES) IMPLANT
SPONGE GAUZE 2X2 8PLY STRL LF (GAUZE/BANDAGES/DRESSINGS) IMPLANT
SPONGE LAP 18X18 RF (DISPOSABLE) ×3 IMPLANT
STAPLER VISISTAT 35W (STAPLE) IMPLANT
STOCKINETTE IMPERVIOUS LG (DRAPES) IMPLANT
STRIP CLOSURE SKIN 1/2X4 (GAUZE/BANDAGES/DRESSINGS) IMPLANT
SUCTION FRAZIER HANDLE 10FR (MISCELLANEOUS)
SUCTION TUBE FRAZIER 10FR DISP (MISCELLANEOUS) IMPLANT
SURGILUBE 2OZ TUBE FLIPTOP (MISCELLANEOUS) ×3 IMPLANT
SUT MNCRL 6-0 UNDY P1 1X18 (SUTURE) IMPLANT
SUT MNCRL AB 3-0 PS2 18 (SUTURE) ×3 IMPLANT
SUT MNCRL AB 4-0 PS2 18 (SUTURE) IMPLANT
SUT MON AB 5-0 P3 18 (SUTURE) IMPLANT
SUT MON AB 5-0 PS2 18 (SUTURE) IMPLANT
SUT MONOCRYL 6-0 P1 1X18 (SUTURE)
SUT PROLENE 5 0 P 3 (SUTURE) IMPLANT
SUT PROLENE 5 0 PS 2 (SUTURE) IMPLANT
SUT PROLENE 6 0 P 1 18 (SUTURE) IMPLANT
SUT SILK 3 0 PS 1 (SUTURE) ×6 IMPLANT
SUT VIC AB 5-0 P-3 18X BRD (SUTURE) IMPLANT
SUT VIC AB 5-0 P3 18 (SUTURE)
SUT VIC AB 5-0 PS2 18 (SUTURE) ×27 IMPLANT
SUT VICRYL 4-0 PS2 18IN ABS (SUTURE) IMPLANT
SYR BULB 3OZ (MISCELLANEOUS) ×3 IMPLANT
SYR BULB IRRIGATION 50ML (SYRINGE) IMPLANT
SYR CONTROL 10ML LL (SYRINGE) ×3 IMPLANT
TOWEL GREEN STERILE FF (TOWEL DISPOSABLE) ×3 IMPLANT
TRAY DSU PREP LF (CUSTOM PROCEDURE TRAY) ×3 IMPLANT
TUBE CONNECTING 20X1/4 (TUBING) ×3 IMPLANT
UNDERPAD 30X36 HEAVY ABSORB (UNDERPADS AND DIAPERS) ×3 IMPLANT
YANKAUER SUCT BULB TIP NO VENT (SUCTIONS) ×3 IMPLANT

## 2019-07-23 NOTE — Anesthesia Postprocedure Evaluation (Signed)
Anesthesia Post Note  Patient: Jesse Pope  Procedure(s) Performed: Excision of changing skin lesions bilateral legs (Bilateral Leg Lower) APPLICATION OF Integra OF EXTREMITY, RIGHT LEG (Right Leg Lower)     Patient location during evaluation: PACU Anesthesia Type: General Level of consciousness: awake and alert Pain management: pain level controlled Vital Signs Assessment: post-procedure vital signs reviewed and stable Respiratory status: spontaneous breathing, nonlabored ventilation, respiratory function stable and patient connected to nasal cannula oxygen Cardiovascular status: blood pressure returned to baseline and stable Postop Assessment: no apparent nausea or vomiting Anesthetic complications: no    Last Vitals:  Vitals:   07/23/19 0930 07/23/19 1000  BP: 119/70 119/70  Pulse: 76 76  Resp: 16 16  Temp: 36.6 C 36.6 C  SpO2:  98%    Last Pain:  Vitals:   07/23/19 1000  TempSrc:   PainSc: 3                  Brehanna Deveny

## 2019-07-23 NOTE — Interval H&P Note (Signed)
History and Physical Interval Note:  07/23/2019 7:11 AM  Jesse Pope  has presented today for surgery, with the diagnosis of Skin lesion of lower right leg.  The various methods of treatment have been discussed with the patient and family. After consideration of risks, benefits and other options for treatment, the patient has consented to  Procedure(s): Excision of changing skin lesion right leg (Right) APPLICATION OF Integra OF EXTREMITY (Right) as a surgical intervention.  The patient's history has been reviewed, patient examined, no change in status, stable for surgery.  I have reviewed the patient's chart and labs.  Questions were answered to the patient's satisfaction.     Loel Lofty Apolinar Bero

## 2019-07-23 NOTE — Transfer of Care (Signed)
Immediate Anesthesia Transfer of Care Note  Patient: Jesse Pope  Procedure(s) Performed: Excision of changing skin lesions bilateral legs (Bilateral Leg Lower) APPLICATION OF Integra OF EXTREMITY, RIGHT LEG (Right Leg Lower)  Patient Location: PACU  Anesthesia Type:General  Level of Consciousness: awake, alert , oriented and patient cooperative  Airway & Oxygen Therapy: Patient Spontanous Breathing and Patient connected to nasal cannula oxygen  Post-op Assessment: Report given to RN and Post -op Vital signs reviewed and stable  Post vital signs: Reviewed and stable  Last Vitals:  Vitals Value Taken Time  BP 138/74 07/23/19 0849  Temp    Pulse 89 07/23/19 0852  Resp 25 07/23/19 0852  SpO2 100 % 07/23/19 0852  Vitals shown include unvalidated device data.  Last Pain:  Vitals:   07/23/19 0643  TempSrc: Oral  PainSc: 0-No pain         Complications: No apparent anesthesia complications

## 2019-07-23 NOTE — Op Note (Signed)
DATE OF OPERATION: 07/23/2019  LOCATION: [facility]  PREOPERATIVE DIAGNOSIS: Bilateral lower extremity skin cancer (squamous cell carcinoma)  POSTOPERATIVE DIAGNOSIS: Same  PROCEDURE:  1.  Excision of left leg skin cancer proximal 3.5 x 4 cm 2.  Excision of left leg skin cancer distal 3.5 x 4 cm 3.  Excision of right leg skin cancer 12 x 23 cm 4.  Placement of Integra bilayer to left leg proximal wound 2 x 3 cm 5.  Placement of Integra bilayer to left leg distal wound 3.5 x 4 cm 6.  Placement of Integra bilayer to right leg wound 10 x 23 cm 7.  Partial closure of left leg wound proximal 1.5 x 2 cm  SURGEON: Jakye Mullens [employer], DO  ASSISTANT: Phoebe Sharps, PA  EBL: 5 cc  CONDITION: Stable  COMPLICATIONS: None  INDICATION: The patient, Jesse Pope, is a 69 y.o. male born on 04-10-1951, is here for treatment of bilateral lower extremity skin cancers.  At 1 point he did have a positive biopsy for squamous cell carcinoma.  He is a heavy smoker and is aware this will complicate his recovery.  He also knows this will be a staged procedure in order to try and obtain negative margins.   PROCEDURE DETAILS:  The patient was seen prior to surgery and marked.  The IV antibiotics were given. The patient was taken to the operating room and given a general anesthetic. A standard time out was performed and all information was confirmed by those in the room. SCDs were not placed due to the location of the surgery.   Bilateral legs were prepped with Betadine and.  The patient was draped out.  Lidocaine 1% with epinephrine was injected around the lesions on each leg for intraoperative hemostasis and postop pain control.  Left leg proximal: The needle tip Bovie was used to excise the proximal lesion that was 3.5 x 4 cm.  A 1 cm margin was provided.  The 3-0 silk was used for a proximal short stitch and a lateral long stitch.  Hemostasis was achieved with the electrocautery.   Undermining was done for 1 cm.  There was some laxity in this part of the leg.  This helped to provide a partial closure.  3-0 Monocryl was used to close half of the wound for an area of 1.5 x 2 cm.  Vertical mattress sutures were used.  The central portion had an area of 2 x 3 opening.   This was covered with the Integra 2 x 3 cm piece.  This was sutured into place with a 5-0 Vicryl.  Adaptic was applied.  Left leg distal: The distal leg skin cancer was excised with the needle tip Bovie for a 3.5 x 4 cm area.  There was no laxity in this part of the leg.  A short silk stitch was applied proximally and a long stitch laterally.  Hemostasis was achieved with electrocautery.  A 3.5 x 4 cm piece of Integra was applied and sutured into place with 5-0 Vicryl.  Adaptic was sutured in place.  The leg was then covered with KY gel over the wounds, gauze, ABD, Kerlix and Ace wrap.  Right leg: Local was placed at the periphery of the large en bloc lesion.  The Bovie was then used to excise the entire 12 x 23 cm area of skin cancer involvement.  A 1 cm margin was applied throughout.  Hemostasis was achieved with electrocautery.  The 10 x 23 piece  of Integra was applied and sutured into place with 5-0 Vicryl.  Adaptic was sutured in place. The KY gel was applied to the wound.  The wound was covered with gauze, ABD, Kerlix and then wrapped with a Ace wrap.  The en bloc excision was sent to pathology with a short stitch proximal, long stitch distal and double long stitch lateral.  The patient was allowed to wake up and taken to recovery room in stable condition at the end of the case. The family was notified at the end of the case.   The advanced practice practitioner (APP) assisted throughout the case.  The APP was essential in retraction and counter traction when needed to make the case progress smoothly.  This retraction and assistance made it possible to see the tissue plans for the procedure.  The assistance was needed  for blood control, tissue re-approximation and assisted with closure of the incision site.  The [place] was signed into law in 2016 which includes the topic of electronic health records.  This provides immediate access to information in MyChart.  This includes consultation notes, operative notes, office notes, lab results and pathology reports.  If you have any questions about what you read please let us know at your next visit or call us at the office.  We are right here with you.

## 2019-07-23 NOTE — Discharge Instructions (Addendum)
INSTRUCTIONS FOR AFTER SURGERY   You will likely have some questions about what to expect following your operation.  The following information will help you and your family understand what to expect when you are discharged from the hospital.  Following these guidelines will help ensure a smooth recovery and reduce risks of complications.  Postoperative instructions include information on: diet, wound care, medications and physical activity.  AFTER SURGERY Expect to go home after the procedure.  In some cases, you may need to spend one night in the hospital for observation.  DIET This surgery does not require a specific diet.  However, I have to mention that the healthier you eat the better your body can start healing. It is important to increasing your protein intake.  This means limiting the foods with added sugar.  Focus on fruits and vegetables and some meat.  If you have any liposuction during your procedure be sure to drink water.  If your urine is bright yellow, then it is concentrated, and you need to drink more water.  As a general rule after surgery, you should have 8 ounces of water every hour while awake.  If you find you are persistently nauseated or unable to take in liquids let us know.  NO TOBACCO USE or EXPOSURE.  This will slow your healing process and increase the risk of a wound..  WOUND CARE You can shower in 5 days.  Use fragrance free soap.  Dial, [place], [place] and Cetaphil are usually mild on the skin.  You have Integra on your legs.  This will help your body lay down some good granulation tissue and help you heal the wound.  You will apply KY gel to the wound every other day.  Then wrap with gauze, ABD, Kerlex and ace wrap.  Call office with any questions.    ACTIVITY No heavy lifting until cleared by the doctor.  It is OK to walk and climb stairs. In fact, moving your legs is very important to decrease your risk of a blood clot.  It will also help keep you from getting  deconditioned.  Every 1 to 2 hours get up and walk for 5 minutes. This will help with a quicker recovery back to normal.  Let pain be your guide so you don't do too much.  NO, you cannot do the spring cleaning and don't plan on taking care of anyone else.  This is your time for TLC.   WORK Everyone returns to work at different times. As a rough guide, most people take at least 1 - 2 weeks off prior to returning to work. If you need documentation for your job, bring the forms to your postoperative follow up visit.  DRIVING Arrange for someone to bring you home from the hospital.  You may be able to drive a few days after surgery but not while taking any narcotics or valium.  BOWEL MOVEMENTS Constipation can occur after anesthesia and while taking pain medication.  It is important to stay ahead for your comfort.  We recommend taking Milk of Magnesia (2 tablespoons; twice a day) while taking the pain pills.  SEROMA This is fluid your body tried to put in the surgical site.  This is normal but if it creates excessive pain and swelling let us know.  It usually decreases in a few weeks.  MEDICATIONS and PAIN CONTROL At your preoperative visit for you history and physical you were given the following medications: 1. An antibiotic: Start this  medication when you get home and take according to the instructions on the bottle. 2. Zofran 4 mg:  This is to treat nausea and vomiting.  You can take this every 6 hours as needed and only if needed. 3. Norco (hydrocodone/acetaminophen) 5/325 mg:  This is only to be used after you have taken the motrin or the tylenol. Every 8 hours as needed. Over the counter Medication to take: 4. Ibuprofen (Motrin) 600 mg:  Take this every 6 hours.  If you have additional pain then take 500 mg of the tylenol.  Only take the Norco after you have tried these two. 5. Miralax or stool softener of choice: Take this according to the bottle if you take the [place] Call  your surgeon's office if any of the following occur: . Fever 101 degrees F or greater . Excessive bleeding or fluid from the incision site. . Pain that increases over time without aid from the medications . Redness, warmth, or pus draining from incision sites . Persistent nausea or inability to take in liquids . Severe misshapen area that underwent the operation.   Post Anesthesia Home Care Instructions  Activity: Get plenty of rest for the remainder of the day. A responsible individual must stay with you for 24 hours following the procedure.  For the next 24 hours, DO NOT: -Drive a car -Paediatric nurse -Drink alcoholic beverages -Take any medication unless instructed by your physician -Make any legal decisions or sign important papers.  Meals: Start with liquid foods such as gelatin or soup. Progress to regular foods as tolerated. Avoid greasy, spicy, heavy foods. If nausea and/or vomiting occur, drink only clear liquids until the nausea and/or vomiting subsides. Call your physician if vomiting continues.  Special Instructions/Symptoms: Your throat may feel dry or sore from the anesthesia or the breathing tube placed in your throat during surgery. If this causes discomfort, gargle with warm salt water. The discomfort should disappear within 24 hours.  If you had a scopolamine patch placed behind your ear for the management of post- operative nausea and/or vomiting:  1. The medication in the patch is effective for 72 hours, after which it should be removed.  Wrap patch in a tissue and discard in the trash. Wash hands thoroughly with soap and water. 2. You may remove the patch earlier than 72 hours if you experience unpleasant side effects which may include dry mouth, dizziness or visual disturbances. 3. Avoid touching the patch. Wash your hands with soap and water after contact with the patch.

## 2019-07-23 NOTE — Anesthesia Procedure Notes (Signed)
Procedure Name: LMA Insertion Date/Time: 07/23/2019 7:34 AM Performed by: Raenette Rover, CRNA Pre-anesthesia Checklist: Patient identified, Emergency Drugs available, Suction available and Patient being monitored Patient Re-evaluated:Patient Re-evaluated prior to induction Oxygen Delivery Method: Circle system utilized Preoxygenation: Pre-oxygenation with 100% oxygen Induction Type: IV induction LMA: LMA inserted LMA Size: 4.0 Number of attempts: 1 Placement Confirmation: positive ETCO2 and breath sounds checked- equal and bilateral Tube secured with: Tape Dental Injury: Teeth and Oropharynx as per pre-operative assessment

## 2019-07-24 ENCOUNTER — Telehealth: Payer: Self-pay

## 2019-07-24 ENCOUNTER — Telehealth: Payer: Self-pay | Admitting: Plastic Surgery

## 2019-07-24 ENCOUNTER — Encounter: Payer: Self-pay | Admitting: *Deleted

## 2019-07-24 NOTE — Telephone Encounter (Signed)
Call to [facility]  Ph# [phone] fax# [phone] Hoyle Sauer verified that her company would be able to accept this request/insurance for pt to have home health care in home 2- times /week for dressing changes as follows: per Dr. Marla Roe she requests that the dressing changes be performed 2/week using: KY gel,  Gauze pads, ABD pads, Kerlix, & Ace wraps to bilateral leg wounds This was faxed to [facility]

## 2019-07-27 LAB — SURGICAL PATHOLOGY

## 2019-07-29 ENCOUNTER — Telehealth: Payer: Self-pay

## 2019-07-29 NOTE — Telephone Encounter (Signed)
  We have left several messages with the patient for our nurse to come out and see him.  He is not returning calls on either number we have for him.  I left a message also for the daughter.  Do you have any other contact information for him? Thanks!  [place] [phone]   I emailed Hoyle Sauer 2 phone numbers to try to reach pt to schedule having supplies delivered to his home I informed her that he has a f/u visit here in office on Friday 07/31/19 [facility]

## 2019-07-31 ENCOUNTER — Telehealth: Payer: Self-pay

## 2019-07-31 ENCOUNTER — Ambulatory Visit (INDEPENDENT_AMBULATORY_CARE_PROVIDER_SITE_OTHER): Payer: Medicare HMO | Admitting: Plastic Surgery

## 2019-07-31 ENCOUNTER — Encounter: Payer: Self-pay | Admitting: Plastic Surgery

## 2019-07-31 ENCOUNTER — Other Ambulatory Visit: Payer: Self-pay

## 2019-07-31 DIAGNOSIS — S81809A Unspecified open wound, unspecified lower leg, initial encounter: Secondary | ICD-10-CM

## 2019-07-31 NOTE — Telephone Encounter (Signed)
Faxed order to [facility] for medical supplies.

## 2019-07-31 NOTE — Progress Notes (Signed)
   Subjective:    Patient ID: Jesse Pope, male    DOB: 10-18-1950, 69 y.o.   MRN: [id number]  The patient is a 69 year old man here for follow-up after undergoing massive excision of bilateral lower leg lesions.  The pathology showed hypertrophic lichenoid reaction with squamous atypia the margins were free.  Integra was placed.  Been doing [place] dressing changes.  Overall he is done really well with the pain.  Nothing looks to be infected.  There is no abnormal drainage.   Review of Systems  Constitutional: Negative.   Eyes: Negative.   Respiratory: Negative.   Cardiovascular: Negative.   Skin: Positive for color change and wound.       Objective:   Physical Exam Vitals and nursing note reviewed.  Constitutional:      Appearance: Normal appearance.  Cardiovascular:     Rate and Rhythm: Normal rate.  Pulmonary:     Effort: Pulmonary effort is normal.  Musculoskeletal:       Legs:  Neurological:     General: No focal deficit present.     Mental Status: He is alert and oriented to person, place, and time.  Psychiatric:        Mood and Affect: Mood normal.        Behavior: Behavior normal.        Thought Content: Thought content normal.         Assessment & Plan:     ICD-10-CM   1. Open wound of lower extremity, unspecified laterality, initial encounter  S81.809A     With the path report back we can move towards split-thickness skin graft to bilateral legs.  We will need the VAC machine.

## 2019-08-04 ENCOUNTER — Telehealth: Payer: Self-pay

## 2019-08-04 DIAGNOSIS — S81809A Unspecified open wound, unspecified lower leg, initial encounter: Secondary | ICD-10-CM | POA: Diagnosis not present

## 2019-08-04 NOTE — Telephone Encounter (Signed)
Received call from [place] from [facility] requesting wound measurements for bilateral leg wounds. She can be reached at: [phone], or the order can be updated and faxed to: [phone].

## 2019-08-04 NOTE — Telephone Encounter (Signed)
Faxed amended order with measurements to [facility]

## 2019-08-05 ENCOUNTER — Other Ambulatory Visit: Payer: Self-pay

## 2019-08-05 ENCOUNTER — Inpatient Hospital Stay: Payer: Medicare HMO | Admitting: Hematology

## 2019-08-05 ENCOUNTER — Telehealth: Payer: Self-pay

## 2019-08-05 ENCOUNTER — Inpatient Hospital Stay: Payer: Medicare HMO | Attending: Hematology

## 2019-08-05 ENCOUNTER — Encounter: Payer: Self-pay | Admitting: Hematology

## 2019-08-05 VITALS — BP 108/67 | HR 80 | Temp 98.3°F | Resp 17 | Ht 70.0 in | Wt 167.1 lb

## 2019-08-05 DIAGNOSIS — D72821 Monocytosis (symptomatic): Secondary | ICD-10-CM | POA: Diagnosis not present

## 2019-08-05 DIAGNOSIS — M79604 Pain in right leg: Secondary | ICD-10-CM | POA: Diagnosis not present

## 2019-08-05 DIAGNOSIS — Z880 Allergy status to penicillin: Secondary | ICD-10-CM | POA: Insufficient documentation

## 2019-08-05 DIAGNOSIS — C61 Malignant neoplasm of prostate: Secondary | ICD-10-CM | POA: Diagnosis not present

## 2019-08-05 DIAGNOSIS — C44722 Squamous cell carcinoma of skin of right lower limb, including hip: Secondary | ICD-10-CM

## 2019-08-05 DIAGNOSIS — Z79899 Other long term (current) drug therapy: Secondary | ICD-10-CM | POA: Insufficient documentation

## 2019-08-05 DIAGNOSIS — F17290 Nicotine dependence, other tobacco product, uncomplicated: Secondary | ICD-10-CM | POA: Diagnosis not present

## 2019-08-05 DIAGNOSIS — Z720 Tobacco use: Secondary | ICD-10-CM | POA: Diagnosis not present

## 2019-08-05 LAB — CBC WITH DIFFERENTIAL (CANCER CENTER ONLY)
Abs Immature Granulocytes: 0.04 10*3/uL (ref 0.00–0.07)
Basophils Absolute: 0.1 10*3/uL (ref 0.0–0.1)
Basophils Relative: 1 %
Eosinophils Absolute: 0.2 10*3/uL (ref 0.0–0.5)
Eosinophils Relative: 1 %
HCT: 38.1 % — ABNORMAL LOW (ref 39.0–52.0)
Hemoglobin: 12.8 g/dL — ABNORMAL LOW (ref 13.0–17.0)
Immature Granulocytes: 0 %
Lymphocytes Relative: 10 %
Lymphs Abs: 1.1 10*3/uL (ref 0.7–4.0)
MCH: 29.5 pg (ref 26.0–34.0)
MCHC: 33.6 g/dL (ref 30.0–36.0)
MCV: 87.8 fL (ref 80.0–100.0)
Monocytes Absolute: 1.4 10*3/uL — ABNORMAL HIGH (ref 0.1–1.0)
Monocytes Relative: 13 %
Neutro Abs: 7.7 10*3/uL (ref 1.7–7.7)
Neutrophils Relative %: 75 %
Platelet Count: 362 10*3/uL (ref 150–400)
RBC: 4.34 MIL/uL (ref 4.22–5.81)
RDW: 12.3 % (ref 11.5–15.5)
WBC Count: 10.4 10*3/uL (ref 4.0–10.5)
nRBC: 0 % (ref 0.0–0.2)

## 2019-08-05 LAB — CMP (CANCER CENTER ONLY)
ALT: 8 U/L (ref 0–44)
AST: 10 U/L — ABNORMAL LOW (ref 15–41)
Albumin: 3.4 g/dL — ABNORMAL LOW (ref 3.5–5.0)
Alkaline Phosphatase: 89 U/L (ref 38–126)
Anion gap: 9 (ref 5–15)
BUN: 9 mg/dL (ref 8–23)
CO2: 23 mmol/L (ref 22–32)
Calcium: 8.4 mg/dL — ABNORMAL LOW (ref 8.9–10.3)
Chloride: 96 mmol/L — ABNORMAL LOW (ref 98–111)
Creatinine: 0.98 mg/dL (ref 0.61–1.24)
GFR, Est AFR Am: >60 mL/min (ref >60)
GFR, Estimated: >60 mL/min (ref >60)
Glucose, Bld: 99 mg/dL (ref 70–99)
Potassium: 4.6 mmol/L (ref 3.5–5.1)
Sodium: 128 mmol/L — ABNORMAL LOW (ref 135–145)
Total Bilirubin: 0.7 mg/dL (ref 0.3–1.2)
Total Protein: 6.9 g/dL (ref 6.5–8.1)

## 2019-08-05 LAB — LACTATE DEHYDROGENASE: LDH: 139 U/L (ref 98–192)

## 2019-08-05 LAB — SAVE SMEAR(SSMR), FOR PROVIDER SLIDE REVIEW

## 2019-08-05 NOTE — Progress Notes (Signed)
[place] OFFICE PROGRESS NOTE  Patient Care Team: Lawerance Cruel, MD as PCP - General (Family Medicine)  HEME/ONC OVERVIEW: 1. Hx of squamous cell carcinoma of the skin -Hx of skin SCCa of lower extremities  -07/2019: massive bilateral lower extremity skin lesion excision  Path: hypertrophic lichenoid reaction with squamous cell atypia, margins free; ddx includes drug reaction vs an inflamed, early evolving squamous cell carcinoma  -Followed by Dr. Marla Roe of plastic surgery   2. Stage I ([id number] adenocarcinoma of the prostate, GS 4+3 -01/2018 - 03/2018: prostate RT, 70 Gy/28 fx, Dr. Tammi Klippel  -On observation   3. Borderline monocytosis  -02/2019: WBC 6.4k w/ monocyte 14.2% (abs 900) and basophil 2.5% (abs 200); CBC otherwise completely normal   BCR/ABL FISH normal; ESR, CRP normal  TREATMENT REGIMEN:  02/11/2018 - 03/21/2018: prostate RT, 70 Gy/28 fx, Dr. Tammi Klippel   07/23/2019: bilateral lower extremity skin massive excision by Dr. Marla Roe  ASSESSMENT & PLAN:   Borderline monocytosis  -Most likely secondary to extensive skin lesion -Review of the pathology from the skin excisionshowed infiltration by mononuclear cells -Clinically, patient denies any constitutional symptoms -Absolute monocyte count 1400k today, stable -I anticipate that the monocytosis will resolve over time after the recent surgery -I will plan to repeat labs in 6 months to monitor the monocyte trend   History of squamous cell carcinoma of the skin -I reviewed plastic surgery clinic notes and pathology results in detail -In summary, the patient underwent massive skin excision in the bilateral lower extremities on 07/23/2019.  Interestingly, the pathology showed hypertrophic lichenoid reaction with squamous cell atypia, margins free.  The differentials include drug reaction vs an inflamed, early evolving squamous cell carcinoma.  -He is healing well from the surgery, and scheduled for  skin graft placement on 08/19/2019  -As there is no evidence of malignancy, I do not think any radiation or systemic therapy is indicated -I counseled the patient on the importance of avoiding sun exposure and applying sunscreen while working outside -Also encouraged patient to continue close follow-up with plastic surgery and dermatology for skin surveillance  Tobacco abuse -Patient currently smokes 3 cigars/day -I had a very lengthy discussion with the patient regarding the importance of smoking cessation, given its impact on the risk of recurrent skin cancer, wound healing and the viability of skin graft -We discussed some of the pharmacologic options for smoking cessation, including nicotine patch, gums, and lozenges -Patient expressed understanding, but declined pharmacologic intervention.  He would like to try to quit smoking on his own.  Orders Placed This Encounter  Procedures  . CBC with Differential (Cancer Center Only)    Standing Status:   Future    Standing Expiration Date:   09/08/2020  . CMP ([place] only)    Standing Status:   Future    Standing Expiration Date:   09/08/2020  . Save Smear ([facility])    Standing Status:   Future    Standing Expiration Date:   08/04/2020  . Lactate dehydrogenase    Standing Status:   Future    Standing Expiration Date:   09/08/2020   The total time spent in the appointment was 32 minutes encounter with patients including review of chart and various tests results, discussions about plan of care and coordination of care plan  All questions were answered. The patient knows to call the clinic with any problems, questions or concerns. No barriers to learning was detected.  Return in 6 months for labs and  clinic follow-up.  Tish Men, MD 1/20/20219:11 AM  CHIEF COMPLAINT: "I am doing okay"  INTERVAL HISTORY: Jesse Pope returns clinic for follow-up of history of squamous cell carcinoma of the skin and monocytosis.  Patient underwent massive  skin excision in bilateral lower extremities in early 07/2019 by Dr. Marla Roe.  During his recent follow-up with plastic surgery, the wound appears to be healing well.  He has some intermittent pain in the right lower extremity, for which he takes Norco as needed with adequate pain control.  He is compliant with daily dressing changes.  He still smokes 3 cigars/day.  He continues to work as a Development worker, community.  He denies any other complaint today.  REVIEW OF SYSTEMS:   Constitutional: ( - ) fevers, ( - )  chills , ( - ) night sweats Eyes: ( - ) blurriness of vision, ( - ) double vision, ( - ) watery eyes Ears, nose, mouth, throat, and face: ( - ) mucositis, ( - ) sore throat Respiratory: ( - ) cough, ( - ) dyspnea, ( - ) wheezes Cardiovascular: ( - ) palpitation, ( - ) chest discomfort, ( - ) lower extremity swelling Gastrointestinal:  ( - ) nausea, ( - ) heartburn, ( - ) change in bowel habits Skin: ( + ) abnormal skin rashes Lymphatics: ( - ) new lymphadenopathy, ( - ) easy bruising Neurological: ( - ) numbness, ( - ) tingling, ( - ) new weaknesses Behavioral/Psych: ( - ) mood change, ( - ) new changes  All other systems were reviewed with the patient and are negative.  SUMMARY OF ONCOLOGIC HISTORY: Oncology History   No history exists.    I have reviewed the past medical history, past surgical history, social history and family history with the patient and they are unchanged from previous note.  ALLERGIES:  is allergic to penicillins.  MEDICATIONS:  Current Outpatient Medications  Medication Sig Dispense Refill  . amLODipine (NORVASC) 10 MG tablet Take 10 mg by mouth daily.    . diphenhydrAMINE (BENADRYL) 25 MG tablet Take 25 mg by mouth every morning.    Marland Kitchen HYDROcodone-acetaminophen (NORCO/VICODIN) 5-325 MG tablet Take 1 tablet by mouth every 6 (six) hours as needed. 20 tablet 0  . loperamide (IMODIUM) 1 MG/5ML solution Take 4 mg by mouth as needed for diarrhea or loose stools.    .  methocarbamol (ROBAXIN) 500 MG tablet Take 1 tablet (500 mg total) by mouth every 6 (six) hours as needed for muscle spasms. 30 tablet 5  . ondansetron (ZOFRAN ODT) 4 MG disintegrating tablet Take 1 tablet (4 mg total) by mouth every 8 (eight) hours as needed. 20 tablet 0  . ramipril (ALTACE) 10 MG capsule Take 10 mg by mouth daily.    Marland Kitchen FLUAD QUADRIVALENT 0.5 ML injection     . oxyCODONE (ROXICODONE) 5 MG immediate release tablet Take 1 tablet (5 mg total) by mouth every 6 (six) hours as needed for severe pain or breakthrough pain. (Patient not taking: Reported on 08/05/2019) 40 tablet 0   No current facility-administered medications for this visit.    PHYSICAL EXAMINATION: ECOG PERFORMANCE STATUS: 0 - Asymptomatic  Today's Vitals   08/05/19 0829 08/05/19 0836  BP: 108/67   Pulse: 80   Resp: 17   Temp: 98.3 F (36.8 C)   TempSrc: Temporal   SpO2: 99%   Weight: 167 lb 1.6 oz (75.8 kg)   Height: '5\' 10"'$  (1.778 m)   PainSc:  5  Body mass index is 23.98 kg/m.  Filed Weights   08/05/19 0829  Weight: 167 lb 1.6 oz (75.8 kg)    GENERAL: alert, no distress and comfortable SKIN: bilateral lower extremities covered in dressing and ACE wraps, did not undress  EYES: conjunctiva are pink and non-injected, sclera clear OROPHARYNX: no exudate, no erythema; lips, buccal mucosa, and tongue normal  NECK: supple, non-tender LYMPH:  no palpable lymphadenopathy in the cervical LUNGS: clear to auscultation with normal breathing effort HEART: regular rate & rhythm and no murmurs and no lower extremity edema ABDOMEN: soft, non-tender, non-distended, normal bowel sounds Musculoskeletal: no cyanosis of digits and no clubbing  PSYCH: alert & oriented x 3, fluent speech  LABORATORY DATA:  I have reviewed the data as listed    Component Value Date/Time   NA 128 (L) 05/20/2019 1019   K 4.0 05/20/2019 1019   CL 96 (L) 05/20/2019 1019   CO2 23 05/20/2019 1019   GLUCOSE 127 (H) 05/20/2019 1019    BUN 10 05/20/2019 1019   CREATININE 0.94 05/20/2019 1019   CREATININE 0.98 05/06/2019 0837   CALCIUM 8.7 (L) 05/20/2019 1019   PROT 7.5 05/06/2019 0837   ALBUMIN 4.1 05/06/2019 0837   AST 14 (L) 05/06/2019 0837   ALT 13 05/06/2019 0837   ALKPHOS 55 05/06/2019 0837   BILITOT 1.0 05/06/2019 0837   GFRNONAA >60 05/20/2019 1019   GFRNONAA >60 05/06/2019 0837   GFRAA >60 05/20/2019 1019   GFRAA >60 05/06/2019 0837    No results found for: SPEP, UPEP  Lab Results  Component Value Date   WBC 10.4 08/05/2019   NEUTROABS 7.7 08/05/2019   HGB 12.8 (L) 08/05/2019   HCT 38.1 (L) 08/05/2019   MCV 87.8 08/05/2019   PLT 362 08/05/2019      Chemistry      Component Value Date/Time   NA 128 (L) 05/20/2019 1019   K 4.0 05/20/2019 1019   CL 96 (L) 05/20/2019 1019   CO2 23 05/20/2019 1019   BUN 10 05/20/2019 1019   CREATININE 0.94 05/20/2019 1019   CREATININE 0.98 05/06/2019 0837      Component Value Date/Time   CALCIUM 8.7 (L) 05/20/2019 1019   ALKPHOS 55 05/06/2019 0837   AST 14 (L) 05/06/2019 0837   ALT 13 05/06/2019 0837   BILITOT 1.0 05/06/2019 0837       RADIOGRAPHIC STUDIES: I have personally reviewed the radiological images as listed below and agreed with the findings in the report. No results found.

## 2019-08-05 NOTE — Telephone Encounter (Signed)
Faxed [facility] order for medical supplies

## 2019-08-06 ENCOUNTER — Telehealth: Payer: Self-pay

## 2019-08-06 NOTE — Telephone Encounter (Signed)
Spoke with patient and he requested [facility] for [place]. Faxed orders to [facility] [phone] and [phone]

## 2019-08-06 NOTE — Telephone Encounter (Signed)
Called patient on both numbers, LMVM. Need to find out if he has a current home health, if so the name. Or, does he have a preference.

## 2019-08-07 ENCOUNTER — Telehealth: Payer: Self-pay

## 2019-08-07 ENCOUNTER — Other Ambulatory Visit (INDEPENDENT_AMBULATORY_CARE_PROVIDER_SITE_OTHER): Payer: Medicare HMO | Admitting: Plastic Surgery

## 2019-08-07 DIAGNOSIS — S81809A Unspecified open wound, unspecified lower leg, initial encounter: Secondary | ICD-10-CM

## 2019-08-07 MED ORDER — HYDROCODONE-ACETAMINOPHEN 5-325 MG PO TABS: 1.0000 | ORAL_TABLET | Freq: Three times a day (TID) | ORAL | 0 refills | Status: AC | PRN

## 2019-08-07 MED ORDER — HYDROCODONE-ACETAMINOPHEN 5-325 MG PO TABS: 1.0000 | ORAL_TABLET | Freq: Three times a day (TID) | ORAL | 0 refills | Status: DC | PRN

## 2019-08-07 NOTE — Telephone Encounter (Signed)
Faxed wound vac referral to [facility] due to [employer] does not cover [employer]. Spoke with Tiffany from [facility] and they do not cover home health care under [facility] as well. She called someone at [facility] and faxed/foward all the information needed and they will take care of patient.

## 2019-08-07 NOTE — Telephone Encounter (Signed)
Patient called requesting refill on hydrocodone.   

## 2019-08-07 NOTE — Progress Notes (Signed)
Patient still experiencing pain on leg wounds from skin cancer removal. Ibuprofen and Tylenol have not been sufficient to control pain.  Additional Norco Rx sent to pharmacy.

## 2019-08-10 ENCOUNTER — Telehealth: Payer: Self-pay | Admitting: Plastic Surgery

## 2019-08-10 NOTE — Telephone Encounter (Signed)
Hoyle Sauer from [facility] called to let us know that they are unable to connect with [name] and wanted to know if they should discharge him or if there are any suggestions on how to reach him. Please call her back to advise.

## 2019-08-11 ENCOUNTER — Telehealth: Payer: Self-pay | Admitting: Plastic Surgery

## 2019-08-11 NOTE — Telephone Encounter (Signed)
Jesse Pope, called in regards to [facility]. Please give him a call back [phone]

## 2019-08-11 NOTE — Telephone Encounter (Signed)
Called Hoyle Sauer at [facility]. Advised her patient already has [facility] in place.

## 2019-08-11 NOTE — Telephone Encounter (Signed)
Contacted Jesse Pope with [facility]. Confirmed he is handling Jesse Pope. Called Jesse Pope and advised him that Jesse Pope was taking care of the home healthcare.

## 2019-08-12 ENCOUNTER — Other Ambulatory Visit: Payer: Self-pay

## 2019-08-12 ENCOUNTER — Encounter (HOSPITAL_BASED_OUTPATIENT_CLINIC_OR_DEPARTMENT_OTHER): Payer: Self-pay | Admitting: Plastic Surgery

## 2019-08-14 ENCOUNTER — Ambulatory Visit (INDEPENDENT_AMBULATORY_CARE_PROVIDER_SITE_OTHER): Payer: Medicare HMO | Admitting: Plastic Surgery

## 2019-08-14 ENCOUNTER — Encounter: Payer: Self-pay | Admitting: Plastic Surgery

## 2019-08-14 ENCOUNTER — Other Ambulatory Visit: Payer: Self-pay

## 2019-08-14 ENCOUNTER — Telehealth: Payer: Self-pay

## 2019-08-14 VITALS — BP 117/74 | HR 70 | Temp 97.5°F | Ht 70.0 in | Wt 166.0 lb

## 2019-08-14 DIAGNOSIS — S81809D Unspecified open wound, unspecified lower leg, subsequent encounter: Secondary | ICD-10-CM

## 2019-08-14 MED ORDER — CIPROFLOXACIN HCL 500 MG PO TABS: 500.0000 mg | ORAL_TABLET | Freq: Two times a day (BID) | ORAL | 0 refills | Status: AC

## 2019-08-14 MED ORDER — HYDROCODONE-ACETAMINOPHEN 5-325 MG PO TABS: 1.0000 | ORAL_TABLET | Freq: Two times a day (BID) | ORAL | 0 refills | Status: AC | PRN

## 2019-08-14 NOTE — Progress Notes (Signed)
ICD-10-CM   1. Open wound of lower extremity, unspecified laterality, subsequent encounter  S81.809D       Patient ID: Jesse Pope, male    DOB: 01-Jun-1951, 70 y.o.   MRN: [id number]   History of Present Illness: Jesse Pope is a 69 y.o.  male  with a history of extensive skin cancer of the bilateral lower legs.  He presents for preoperative evaluation for upcoming procedure, split thickness bilateral skin grafts with home VAC, scheduled for 08/19/2019 with Dr. Marla Pope.  Jesse Pope reports he is doing very well today.  He is taking great care of the wounds on his bilateral lower legs from previous skin cancer excision.  He is a current every day cigar smoker and has been told he has COPD although reports he does not feel any symptoms from this.  The patient has not had problems with anesthesia.   Past Medical History: Allergies:   Current Medications:  Current Outpatient Medications:  .  amLODipine (NORVASC) 10 MG tablet, Take 10 mg by mouth daily., Disp: , Rfl:  .  diphenhydrAMINE (BENADRYL) 25 MG tablet, Take 25 mg by mouth every morning., Disp: , Rfl:  .  FLUAD QUADRIVALENT 0.5 ML injection, , Disp: , Rfl:  .  loperamide (IMODIUM) 1 MG/5ML solution, Take 4 mg by mouth as needed for diarrhea or loose stools., Disp: , Rfl:  .  methocarbamol (ROBAXIN) 500 MG tablet, Take 1 tablet (500 mg total) by mouth every 6 (six) hours as needed for muscle spasms., Disp: 30 tablet, Rfl: 5 .  ondansetron (ZOFRAN ODT) 4 MG disintegrating tablet, Take 1 tablet (4 mg total) by mouth every 8 (eight) hours as needed., Disp: 20 tablet, Rfl: 0 .  ramipril (ALTACE) 10 MG capsule, Take 10 mg by mouth daily., Disp: , Rfl:   Past Medical Problems: Past Medical History:  Diagnosis Date  . Alcohol abuse 09/07/2015  . Arthritis   . Basal cell carcinoma of right lower leg   . Cellulitis and abscess of leg 09/07/2015  . Cigarette smoker 02/11/2014  . Eczema 02/11/2014  . Hypertension   .  Hyponatremia 02/08/2014  . MRSA (methicillin resistant Staphylococcus aureus)   . Nocturia associated with benign prostatic hypertrophy 09/07/2015  . Pneumonia   . Prostate cancer ([place])    radiation  . Pulmonary nodules 09/10/2015    Past Surgical History: Past Surgical History:  Procedure Laterality Date  . APPLICATION OF A-CELL OF EXTREMITY Right 07/23/2019   Procedure: APPLICATION OF Integra OF EXTREMITY, RIGHT LEG;  Surgeon: Wallace Going, DO;  Location: [place];  Service: Plastics;  Laterality: Right;  . BACK SURGERY     lower- disc  . Biospy of Right Lower Extremity  01/27/2014   SCC  . COLONOSCOPY W/ POLYPECTOMY    . EYE SURGERY Left    cataract  . Heel fracture     patient doesnt remember which one  . I & D EXTREMITY Left 02/10/2014   Procedure: IRRIGATION AND DEBRIDEMENT EXTREMITY/PRE PATELLA BURSA;  Surgeon: Marin Shutter, MD;  Location: [place];  Service: Orthopedics;  Laterality: Left;  . I & D EXTREMITY Left 02/15/2014   Procedure: IRRIGATION AND DEBRIDEMENT EXTREMITY/DELAY CLOSURE OF LEFT KNEE;  Surgeon: Marin Shutter, MD;  Location: [place];  Service: Orthopedics;  Laterality: Left;  . LESION REMOVAL Bilateral 07/23/2019   Procedure: Excision of changing skin lesions bilateral legs;  Surgeon: Wallace Going, DO;  Location:  [place];  Service: Plastics;  Laterality: Bilateral;  . ORIF HUMERUS FRACTURE Left 05/20/2019   Procedure: OPEN REDUCTION INTERNAL FIXATION (ORIF) HUMERUS FRACTURE;  Surgeon: Nicholes Stairs, MD;  Location: [place];  Service: Orthopedics;  Laterality: Left;  120 mins  . RETINAL DETACHMENT SURGERY Left     Social History: Social History   Socioeconomic History  . Marital status: Married    Spouse name: Not on file  . Number of children: 3  . Years of education: Not on file  . Highest education level: Not on file  Occupational History  . Occupation: Development worker, community    Comment: limited hours  Tobacco Use  .  Smoking status: Current Every Day Smoker    Packs/day: 0.00    Years: 51.00    Pack years: 0.00    Types: Cigars  . Smokeless tobacco: Never Used  . Tobacco comment: 2-3 per day  Substance and Sexual Activity  . Alcohol use: Yes    Alcohol/week: 0.0 standard drinks    Comment: Drinks Alcohol daily, 4-6 beers daily  . Drug use: No  . Sexual activity: Not Currently  Other Topics Concern  . Not on file  Social History Narrative   Patient resides in [place] independently. Patient has two daughters and one son. Does not have any grandchildren yet. Works limited hours as a Development worker, community.   Social Determinants of Health   Financial Resource Strain:   . Difficulty of Paying Living Expenses: Not on file  Food Insecurity:   . Worried About Charity fundraiser in the Last Year: Not on file  . Ran Out of Food in the Last Year: Not on file  Transportation Needs:   . Lack of Transportation (Medical): Not on file  . Lack of Transportation (Non-Medical): Not on file  Physical Activity:   . Days of Exercise per Week: Not on file  . Minutes of Exercise per Session: Not on file  Stress:   . Feeling of Stress : Not on file  Social Connections:   . Frequency of Communication with Friends and Family: Not on file  . Frequency of Social Gatherings with Friends and Family: Not on file  . Attends Religious Services: Not on file  . Active Member of Clubs or Organizations: Not on file  . Attends Archivist Meetings: Not on file  . Marital Status: Not on file  Intimate Partner Violence:   . Fear of Current or Ex-Partner: Not on file  . Emotionally Abused: Not on file  . Physically Abused: Not on file  . Sexually Abused: Not on file    Family History: Family History  Problem Relation Age of Onset  . Colon cancer Mother   . Crohn's disease Father   . Breast cancer Sister     Review of Systems: Review of Systems  Constitutional: Negative for chills and fever.  HENT: Negative for  congestion and sore throat.   Respiratory: Negative for cough and shortness of breath.   Cardiovascular: Negative for chest pain and leg swelling.  Gastrointestinal: Negative for abdominal pain, nausea and vomiting.  Skin: Negative for itching and rash.    Physical Exam: Vital Signs BP 117/74 (BP Location: Left Arm, Patient Position: Sitting, Cuff Size: Normal)   Pulse 70   Temp (!) 97.5 F (36.4 C) (Temporal)   Ht 5\' 10"  (1.778 m)   Wt 166 lb (75.3 kg)   SpO2 100%   BMI 23.82 kg/m  Physical Exam Vitals and  nursing note reviewed.  Constitutional:      Appearance: Normal appearance. He is normal weight.  HENT:     Head: Normocephalic and atraumatic.  Eyes:     Extraocular Movements: Extraocular movements intact.  Cardiovascular:     Rate and Rhythm: Normal rate and regular rhythm.     Pulses: Normal pulses.     Heart sounds: Normal heart sounds.  Pulmonary:     Effort: Pulmonary effort is normal.     Breath sounds: Normal breath sounds.  Abdominal:     General: Abdomen is flat. Bowel sounds are normal.     Palpations: Abdomen is soft.  Musculoskeletal:        General: No swelling. Normal range of motion.     Cervical back: Normal range of motion.  Skin:    General: Skin is warm and dry.     Coloration: Skin is not pale.     Findings: No erythema or rash.  Neurological:     General: No focal deficit present.     Mental Status: He is alert and oriented to person, place, and time.  Psychiatric:        Mood and Affect: Mood normal.        Behavior: Behavior normal.        Thought Content: Thought content normal.        Judgment: Judgment normal.     Assessment/Plan:   Mr. Spaur scheduled for split thickness skin graft to bilateral lower legs with Dr. Marla Pope.  Risks, benefits, and alternatives of procedure discussed, questions answered and consent obtained.    Current every day smoker, aware he should abstain for surgery.  Cabrini score: Moderate. Risks  include 69 year old male, COPD, length of planned surgery.  Recommendation for pharmacological prophylaxis since surgery is on his leg so we cannot use mechanical prophylaxis.  Post-op Rx sent to pharmacy: Doxycycline and Norco  The 21st Century Cures Act was signed into law in 2016 which includes the topic of electronic health records.  This provides immediate access to information in MyChart.  This includes consultation notes, operative notes, office notes, lab results and pathology reports.  If you have any questions about what you read please let us know at your next visit or call us at the office.  We are right here with you.    Electronically signed by: Threasa Heads, PA-C 08/14/2019 1:21 PM

## 2019-08-14 NOTE — Telephone Encounter (Signed)
Returned patients call and advised Jesse Pope will send in prescriptions and he can pick them up anytime prior day of surgery.

## 2019-08-14 NOTE — Telephone Encounter (Signed)
Mr. Bandstra called stating he forgot to ask about pain medication and antibiotics during his visit today. He states does not have much pain medication left and would like more for after surgery.

## 2019-08-14 NOTE — H&P (View-Only) (Signed)
ICD-10-CM   1. Open wound of lower extremity, unspecified laterality, subsequent encounter  S81.809D       Patient ID: Jesse Pope, male    DOB: 1950/11/10, 69 y.o.   MRN: [id number]   History of Present Illness: Jesse Pope is a 69 y.o.  male  with a history of extensive skin cancer of the bilateral lower legs.  He presents for preoperative evaluation for upcoming procedure, split thickness bilateral skin grafts with home VAC, scheduled for 08/19/2019 with Dr. Marla Roe.  Mr. Hawn reports he is doing very well today.  He is taking great care of the wounds on his bilateral lower legs from previous skin cancer excision.  He is a current every day cigar smoker and has been told he has COPD although reports he does not feel any symptoms from this.  The patient has not had problems with anesthesia.   Past Medical History: Allergies:   Current Medications:  Current Outpatient Medications:  .  amLODipine (NORVASC) 10 MG tablet, Take 10 mg by mouth daily., Disp: , Rfl:  .  diphenhydrAMINE (BENADRYL) 25 MG tablet, Take 25 mg by mouth every morning., Disp: , Rfl:  .  FLUAD QUADRIVALENT 0.5 ML injection, , Disp: , Rfl:  .  loperamide (IMODIUM) 1 MG/5ML solution, Take 4 mg by mouth as needed for diarrhea or loose stools., Disp: , Rfl:  .  methocarbamol (ROBAXIN) 500 MG tablet, Take 1 tablet (500 mg total) by mouth every 6 (six) hours as needed for muscle spasms., Disp: 30 tablet, Rfl: 5 .  ondansetron (ZOFRAN ODT) 4 MG disintegrating tablet, Take 1 tablet (4 mg total) by mouth every 8 (eight) hours as needed., Disp: 20 tablet, Rfl: 0 .  ramipril (ALTACE) 10 MG capsule, Take 10 mg by mouth daily., Disp: , Rfl:   Past Medical Problems: Past Medical History:  Diagnosis Date  . Alcohol abuse 09/07/2015  . Arthritis   . Basal cell carcinoma of right lower leg   . Cellulitis and abscess of leg 09/07/2015  . Cigarette smoker 02/11/2014  . Eczema 02/11/2014  . Hypertension   .  Hyponatremia 02/08/2014  . MRSA (methicillin resistant Staphylococcus aureus)   . Nocturia associated with benign prostatic hypertrophy 09/07/2015  . Pneumonia   . Prostate cancer ([place])    radiation  . Pulmonary nodules 09/10/2015    Past Surgical History: Past Surgical History:  Procedure Laterality Date  . APPLICATION OF A-CELL OF EXTREMITY Right 07/23/2019   Procedure: APPLICATION OF Integra OF EXTREMITY, RIGHT LEG;  Surgeon: Wallace Going, DO;  Location: [facility] [facility];  Service: Plastics;  Laterality: Right;  . BACK SURGERY     lower- disc  . Biospy of Right Lower Extremity  01/27/2014   SCC  . COLONOSCOPY W/ POLYPECTOMY    . EYE SURGERY Left    cataract  . Heel fracture     patient doesnt remember which one  . I & D EXTREMITY Left 02/10/2014   Procedure: IRRIGATION AND DEBRIDEMENT EXTREMITY/PRE PATELLA BURSA;  Surgeon: Marin Shutter, MD;  Location: [facility] [place];  Service: Orthopedics;  Laterality: Left;  . I & D EXTREMITY Left 02/15/2014   Procedure: IRRIGATION AND DEBRIDEMENT EXTREMITY/DELAY CLOSURE OF LEFT KNEE;  Surgeon: Marin Shutter, MD;  Location: [facility] [place];  Service: Orthopedics;  Laterality: Left;  . LESION REMOVAL Bilateral 07/23/2019   Procedure: Excision of changing skin lesions bilateral legs;  Surgeon: Wallace Going, DO;  Location:  [place];  Service: Plastics;  Laterality: Bilateral;  . ORIF HUMERUS FRACTURE Left 05/20/2019   Procedure: OPEN REDUCTION INTERNAL FIXATION (ORIF) HUMERUS FRACTURE;  Surgeon: Nicholes Stairs, MD;  Location: [facility];  Service: Orthopedics;  Laterality: Left;  120 mins  . RETINAL DETACHMENT SURGERY Left     Social History: Social History   Socioeconomic History  . Marital status: Married    Spouse name: Not on file  . Number of children: 3  . Years of education: Not on file  . Highest education level: Not on file  Occupational History  . Occupation: Development worker, community    Comment: limited hours  Tobacco Use  .  Smoking status: Current Every Day Smoker    Packs/day: 0.00    Years: 51.00    Pack years: 0.00    Types: Cigars  . Smokeless tobacco: Never Used  . Tobacco comment: 2-3 per day  Substance and Sexual Activity  . Alcohol use: Yes    Alcohol/week: 0.0 standard drinks    Comment: Drinks Alcohol daily, 4-6 beers daily  . Drug use: No  . Sexual activity: Not Currently  Other Topics Concern  . Not on file  Social History Narrative   Patient resides in [place] independently. Patient has two daughters and one son. Does not have any grandchildren yet. Works limited hours as a Development worker, community.   Social Determinants of Health   Financial Resource Strain:   . Difficulty of Paying Living Expenses: Not on file  Food Insecurity:   . Worried About Charity fundraiser in the Last Year: Not on file  . Ran Out of Food in the Last Year: Not on file  Transportation Needs:   . Lack of Transportation (Medical): Not on file  . Lack of Transportation (Non-Medical): Not on file  Physical Activity:   . Days of Exercise per Week: Not on file  . Minutes of Exercise per Session: Not on file  Stress:   . Feeling of Stress : Not on file  Social Connections:   . Frequency of Communication with Friends and Family: Not on file  . Frequency of Social Gatherings with Friends and Family: Not on file  . Attends Religious Services: Not on file  . Active Member of Clubs or Organizations: Not on file  . Attends Archivist Meetings: Not on file  . Marital Status: Not on file  Intimate Partner Violence:   . Fear of Current or Ex-Partner: Not on file  . Emotionally Abused: Not on file  . Physically Abused: Not on file  . Sexually Abused: Not on file    Family History: Family History  Problem Relation Age of Onset  . Colon cancer Mother   . Crohn's disease Father   . Breast cancer Sister     Review of Systems: Review of Systems  Constitutional: Negative for chills and fever.  HENT: Negative for  congestion and sore throat.   Respiratory: Negative for cough and shortness of breath.   Cardiovascular: Negative for chest pain and leg swelling.  Gastrointestinal: Negative for abdominal pain, nausea and vomiting.  Skin: Negative for itching and rash.    Physical Exam: Vital Signs BP 117/74 (BP Location: Left Arm, Patient Position: Sitting, Cuff Size: Normal)   Pulse 70   Temp (!) 97.5 F (36.4 C) (Temporal)   Ht 5\' 10"  (1.778 m)   Wt 166 lb (75.3 kg)   SpO2 100%   BMI 23.82 kg/m  Physical Exam Vitals and  nursing note reviewed.  Constitutional:      Appearance: Normal appearance. He is normal weight.  HENT:     Head: Normocephalic and atraumatic.  Eyes:     Extraocular Movements: Extraocular movements intact.  Cardiovascular:     Rate and Rhythm: Normal rate and regular rhythm.     Pulses: Normal pulses.     Heart sounds: Normal heart sounds.  Pulmonary:     Effort: Pulmonary effort is normal.     Breath sounds: Normal breath sounds.  Abdominal:     General: Abdomen is flat. Bowel sounds are normal.     Palpations: Abdomen is soft.  Musculoskeletal:        General: No swelling. Normal range of motion.     Cervical back: Normal range of motion.  Skin:    General: Skin is warm and dry.     Coloration: Skin is not pale.     Findings: No erythema or rash.  Neurological:     General: No focal deficit present.     Mental Status: He is alert and oriented to person, place, and time.  Psychiatric:        Mood and Affect: Mood normal.        Behavior: Behavior normal.        Thought Content: Thought content normal.        Judgment: Judgment normal.     Assessment/Plan:   Mr. Dimler scheduled for split thickness skin graft to bilateral lower legs with Dr. Marla Roe.  Risks, benefits, and alternatives of procedure discussed, questions answered and consent obtained.    Current every day smoker, aware he should abstain for surgery.  Cabrini score: Moderate. Risks  include 69 year old male, COPD, length of planned surgery.  Recommendation for pharmacological prophylaxis since surgery is on his leg so we cannot use mechanical prophylaxis.  Post-op Rx sent to pharmacy: Doxycycline and Norco  The 21st Century Cures Act was signed into law in 2016 which includes the topic of electronic health records.  This provides immediate access to information in MyChart.  This includes consultation notes, operative notes, office notes, lab results and pathology reports.  If you have any questions about what you read please let us know at your next visit or call us at the office.  We are right here with you.    Electronically signed by: Threasa Heads, PA-C 08/14/2019 1:21 PM

## 2019-08-15 ENCOUNTER — Other Ambulatory Visit (HOSPITAL_COMMUNITY)
Admission: RE | Admit: 2019-08-15 | Discharge: 2019-08-15 | Disposition: A | Discharge status: Home / Self Care | Payer: Medicare HMO | Source: Ambulatory Visit | Attending: Plastic Surgery | Admitting: Plastic Surgery

## 2019-08-15 DIAGNOSIS — Z20822 Contact with and (suspected) exposure to covid-19: Secondary | ICD-10-CM | POA: Insufficient documentation

## 2019-08-15 DIAGNOSIS — Z01812 Encounter for preprocedural laboratory examination: Secondary | ICD-10-CM | POA: Diagnosis not present

## 2019-08-15 LAB — SARS CORONAVIRUS 2 (TAT 6-24 HRS): SARS Coronavirus 2: NEGATIVE

## 2019-08-19 ENCOUNTER — Encounter (HOSPITAL_BASED_OUTPATIENT_CLINIC_OR_DEPARTMENT_OTHER)
Admission: RE | Disposition: A | Discharge status: Home / Self Care | Payer: Self-pay | Source: Home / Self Care | Attending: Plastic Surgery

## 2019-08-19 ENCOUNTER — Encounter (HOSPITAL_BASED_OUTPATIENT_CLINIC_OR_DEPARTMENT_OTHER): Payer: Self-pay | Admitting: Plastic Surgery

## 2019-08-19 ENCOUNTER — Other Ambulatory Visit: Payer: Self-pay

## 2019-08-19 ENCOUNTER — Ambulatory Visit (HOSPITAL_BASED_OUTPATIENT_CLINIC_OR_DEPARTMENT_OTHER): Payer: Medicare HMO | Admitting: Anesthesiology

## 2019-08-19 ENCOUNTER — Ambulatory Visit (HOSPITAL_BASED_OUTPATIENT_CLINIC_OR_DEPARTMENT_OTHER)
Admission: RE | Admit: 2019-08-19 | Discharge: 2019-08-19 | Disposition: A | Discharge status: Home / Self Care | Payer: Medicare HMO | Attending: Plastic Surgery | Admitting: Plastic Surgery

## 2019-08-19 DIAGNOSIS — S81802A Unspecified open wound, left lower leg, initial encounter: Secondary | ICD-10-CM | POA: Diagnosis not present

## 2019-08-19 DIAGNOSIS — C44722 Squamous cell carcinoma of skin of right lower limb, including hip: Secondary | ICD-10-CM | POA: Diagnosis not present

## 2019-08-19 DIAGNOSIS — Z85828 Personal history of other malignant neoplasm of skin: Secondary | ICD-10-CM | POA: Diagnosis not present

## 2019-08-19 DIAGNOSIS — Z923 Personal history of irradiation: Secondary | ICD-10-CM | POA: Insufficient documentation

## 2019-08-19 DIAGNOSIS — X58XXXA Exposure to other specified factors, initial encounter: Secondary | ICD-10-CM | POA: Diagnosis not present

## 2019-08-19 DIAGNOSIS — I10 Essential (primary) hypertension: Secondary | ICD-10-CM | POA: Insufficient documentation

## 2019-08-19 DIAGNOSIS — F17290 Nicotine dependence, other tobacco product, uncomplicated: Secondary | ICD-10-CM | POA: Insufficient documentation

## 2019-08-19 DIAGNOSIS — M1990 Unspecified osteoarthritis, unspecified site: Secondary | ICD-10-CM | POA: Insufficient documentation

## 2019-08-19 DIAGNOSIS — Z79899 Other long term (current) drug therapy: Secondary | ICD-10-CM | POA: Insufficient documentation

## 2019-08-19 DIAGNOSIS — Z8546 Personal history of malignant neoplasm of prostate: Secondary | ICD-10-CM | POA: Diagnosis not present

## 2019-08-19 DIAGNOSIS — Z8614 Personal history of Methicillin resistant Staphylococcus aureus infection: Secondary | ICD-10-CM | POA: Insufficient documentation

## 2019-08-19 DIAGNOSIS — T8131XA Disruption of external operation (surgical) wound, not elsewhere classified, initial encounter: Secondary | ICD-10-CM | POA: Diagnosis not present

## 2019-08-19 DIAGNOSIS — S81801A Unspecified open wound, right lower leg, initial encounter: Secondary | ICD-10-CM | POA: Insufficient documentation

## 2019-08-19 DIAGNOSIS — S81809A Unspecified open wound, unspecified lower leg, initial encounter: Secondary | ICD-10-CM | POA: Diagnosis not present

## 2019-08-19 DIAGNOSIS — C61 Malignant neoplasm of prostate: Secondary | ICD-10-CM | POA: Diagnosis not present

## 2019-08-19 DIAGNOSIS — E871 Hypo-osmolality and hyponatremia: Secondary | ICD-10-CM | POA: Diagnosis not present

## 2019-08-19 HISTORY — PX: APPLICATION OF WOUND VAC: SHX5189

## 2019-08-19 HISTORY — PX: SKIN SPLIT GRAFT: SHX444

## 2019-08-19 SURGERY — APPLICATION, GRAFT, SKIN, SPLIT-THICKNESS
Anesthesia: General | Site: Leg Lower | Laterality: Right

## 2019-08-19 MED ORDER — CIPROFLOXACIN IN D5W 400 MG/200ML IV SOLN
400.0000 mg | INTRAVENOUS | Status: AC
  Administered 2019-08-19: 09:00:00 400 mg via INTRAVENOUS

## 2019-08-19 MED ORDER — SODIUM CHLORIDE 0.9% FLUSH: 3.0000 mL | INTRAVENOUS | Status: DC | PRN

## 2019-08-19 MED ORDER — CIPROFLOXACIN IN D5W 400 MG/200ML IV SOLN
INTRAVENOUS | Status: AC
  Filled 2019-08-19: qty 200

## 2019-08-19 MED ORDER — PROPOFOL 10 MG/ML IV BOLUS
INTRAVENOUS | Status: DC | PRN
  Administered 2019-08-19: 150 mg via INTRAVENOUS

## 2019-08-19 MED ORDER — ACETAMINOPHEN 650 MG RE SUPP: 650.0000 mg | RECTAL | Status: DC | PRN

## 2019-08-19 MED ORDER — BACITRACIN-NEOMYCIN-POLYMYXIN OINTMENT TUBE
TOPICAL_OINTMENT | CUTANEOUS | Status: AC
  Filled 2019-08-19: qty 14.17

## 2019-08-19 MED ORDER — OXYCODONE HCL 5 MG PO TABS: 5.0000 mg | ORAL_TABLET | Freq: Once | ORAL | Status: DC | PRN

## 2019-08-19 MED ORDER — ACETAMINOPHEN 500 MG PO TABS: 1000.0000 mg | ORAL_TABLET | Freq: Once | ORAL | Status: DC

## 2019-08-19 MED ORDER — ACETAMINOPHEN 325 MG PO TABS: 650.0000 mg | ORAL_TABLET | ORAL | Status: DC | PRN

## 2019-08-19 MED ORDER — FENTANYL CITRATE (PF) 100 MCG/2ML IJ SOLN
INTRAMUSCULAR | Status: AC
  Filled 2019-08-19: qty 2

## 2019-08-19 MED ORDER — OXYCODONE HCL 5 MG PO TABS: 5.0000 mg | ORAL_TABLET | ORAL | Status: DC | PRN

## 2019-08-19 MED ORDER — LIDOCAINE-EPINEPHRINE 1 %-1:100000 IJ SOLN
INTRAMUSCULAR | Status: DC | PRN
  Administered 2019-08-19: 35 mL

## 2019-08-19 MED ORDER — SODIUM CHLORIDE 0.9% FLUSH: 3.0000 mL | Freq: Two times a day (BID) | INTRAVENOUS | Status: DC

## 2019-08-19 MED ORDER — FENTANYL CITRATE (PF) 100 MCG/2ML IJ SOLN
INTRAMUSCULAR | Status: DC | PRN
  Administered 2019-08-19: 50 ug via INTRAVENOUS
  Administered 2019-08-19: 25 ug via INTRAVENOUS

## 2019-08-19 MED ORDER — LACTATED RINGERS IV SOLN: INTRAVENOUS | Status: DC

## 2019-08-19 MED ORDER — EPHEDRINE SULFATE 50 MG/ML IJ SOLN
INTRAMUSCULAR | Status: DC | PRN
  Administered 2019-08-19 (×2): 10 mg via INTRAVENOUS

## 2019-08-19 MED ORDER — CHLORHEXIDINE GLUCONATE CLOTH 2 % EX PADS: 6.0000 | MEDICATED_PAD | Freq: Once | CUTANEOUS | Status: DC

## 2019-08-19 MED ORDER — ONDANSETRON HCL 4 MG/2ML IJ SOLN
INTRAMUSCULAR | Status: DC | PRN
  Administered 2019-08-19: 4 mg via INTRAVENOUS

## 2019-08-19 MED ORDER — FENTANYL CITRATE (PF) 100 MCG/2ML IJ SOLN
25.0000 ug | INTRAMUSCULAR | Status: DC | PRN
  Administered 2019-08-19: 50 ug via INTRAVENOUS

## 2019-08-19 MED ORDER — LIDOCAINE 2% (20 MG/ML) 5 ML SYRINGE
INTRAMUSCULAR | Status: DC | PRN
  Administered 2019-08-19: 60 mg via INTRAVENOUS

## 2019-08-19 MED ORDER — OXYCODONE HCL 5 MG/5ML PO SOLN: 5.0000 mg | Freq: Once | ORAL | Status: DC | PRN

## 2019-08-19 MED ORDER — DEXAMETHASONE SODIUM PHOSPHATE 4 MG/ML IJ SOLN
INTRAMUSCULAR | Status: DC | PRN
  Administered 2019-08-19: 4 mg via INTRAVENOUS

## 2019-08-19 MED ORDER — LIDOCAINE-EPINEPHRINE 1 %-1:100000 IJ SOLN
INTRAMUSCULAR | Status: AC
  Filled 2019-08-19: qty 1

## 2019-08-19 MED ORDER — BACITRACIN ZINC 500 UNIT/GM EX OINT
TOPICAL_OINTMENT | CUTANEOUS | Status: AC
  Filled 2019-08-19: qty 28.35

## 2019-08-19 MED ORDER — PROMETHAZINE HCL 25 MG/ML IJ SOLN: 6.2500 mg | INTRAMUSCULAR | Status: DC | PRN

## 2019-08-19 MED ORDER — PROPOFOL 500 MG/50ML IV EMUL
INTRAVENOUS | Status: DC | PRN
  Administered 2019-08-19: 25 ug/kg/min via INTRAVENOUS

## 2019-08-19 MED ORDER — SODIUM CHLORIDE 0.9 % IV SOLN: 250.0000 mL | INTRAVENOUS | Status: DC | PRN

## 2019-08-19 MED ORDER — BUPIVACAINE HCL (PF) 0.25 % IJ SOLN
INTRAMUSCULAR | Status: AC
  Filled 2019-08-19: qty 30

## 2019-08-19 SURGICAL SUPPLY — 66 items
BAG DECANTER FOR FLEXI CONT (MISCELLANEOUS) IMPLANT
BLADE CLIPPER SURG (BLADE) IMPLANT
BLADE DERMATOME SS (BLADE) ×3 IMPLANT
BLADE HEX COATED 2.75 (ELECTRODE) ×3 IMPLANT
BLADE SURG 10 STRL SS (BLADE) IMPLANT
BLADE SURG 15 STRL LF DISP TIS (BLADE) ×2 IMPLANT
BLADE SURG 15 STRL SS (BLADE) ×1
BNDG ELASTIC 3X5.8 VLCR STR LF (GAUZE/BANDAGES/DRESSINGS) IMPLANT
BNDG ELASTIC 4X5.8 VLCR STR LF (GAUZE/BANDAGES/DRESSINGS) ×15 IMPLANT
BNDG ELASTIC 6X5.8 VLCR STR LF (GAUZE/BANDAGES/DRESSINGS) IMPLANT
BNDG GAUZE ELAST 4 BULKY (GAUZE/BANDAGES/DRESSINGS) ×12 IMPLANT
COTTONBALL LRG STERILE PKG (GAUZE/BANDAGES/DRESSINGS) ×3 IMPLANT
COVER BACK TABLE 60X90IN (DRAPES) ×3 IMPLANT
COVER MAYO STAND STRL (DRAPES) ×3 IMPLANT
COVER WAND RF STERILE (DRAPES) IMPLANT
DECANTER SPIKE VIAL GLASS SM (MISCELLANEOUS) IMPLANT
DERMABOND ADVANCED (GAUZE/BANDAGES/DRESSINGS)
DERMABOND ADVANCED .7 DNX12 (GAUZE/BANDAGES/DRESSINGS) IMPLANT
DERMACARRIERS GRAFT 1 TO 1.5 (DISPOSABLE) ×3
DRAPE INCISE IOBAN 66X45 STRL (DRAPES) ×3 IMPLANT
DRAPE U-SHAPE 76X120 STRL (DRAPES) ×3 IMPLANT
DRSG ADAPTIC 3X8 NADH LF (GAUZE/BANDAGES/DRESSINGS) ×9 IMPLANT
DRSG EMULSION OIL 3X3 NADH (GAUZE/BANDAGES/DRESSINGS) IMPLANT
DRSG HYDROCOLLOID 4X4 (GAUZE/BANDAGES/DRESSINGS) IMPLANT
DRSG OPSITE 6X11 MED (GAUZE/BANDAGES/DRESSINGS) IMPLANT
DRSG PAD ABDOMINAL 8X10 ST (GAUZE/BANDAGES/DRESSINGS) ×3 IMPLANT
DRSG TEGADERM 4X10 (GAUZE/BANDAGES/DRESSINGS) IMPLANT
ELECT REM PT RETURN 9FT ADLT (ELECTROSURGICAL) ×3
ELECTRODE REM PT RTRN 9FT ADLT (ELECTROSURGICAL) ×2 IMPLANT
GAUZE SPONGE 4X4 12PLY STRL (GAUZE/BANDAGES/DRESSINGS) IMPLANT
GAUZE XEROFORM 5X9 LF (GAUZE/BANDAGES/DRESSINGS) ×6 IMPLANT
GLOVE BIO SURGEON STRL SZ 6.5 (GLOVE) ×9 IMPLANT
GLOVE BIOGEL PI IND STRL 6.5 (GLOVE) ×2 IMPLANT
GLOVE BIOGEL PI INDICATOR 6.5 (GLOVE) ×1
GLOVE ECLIPSE 6.5 STRL STRAW (GLOVE) ×3 IMPLANT
GOWN STRL REUS W/ TWL LRG LVL3 (GOWN DISPOSABLE) ×8 IMPLANT
GOWN STRL REUS W/TWL LRG LVL3 (GOWN DISPOSABLE) ×4
GRAFT DERMACARRIERS 1 TO 1.5 (DISPOSABLE) ×2 IMPLANT
NEEDLE HYPO 25X1 1.5 SAFETY (NEEDLE) ×3 IMPLANT
NS IRRIG 1000ML POUR BTL (IV SOLUTION) ×3 IMPLANT
PACK BASIN DAY SURGERY FS (CUSTOM PROCEDURE TRAY) ×3 IMPLANT
PADDING CAST ABS 3INX4YD NS (CAST SUPPLIES)
PADDING CAST ABS 4INX4YD NS (CAST SUPPLIES)
PADDING CAST ABS COTTON 3X4 (CAST SUPPLIES) IMPLANT
PADDING CAST ABS COTTON 4X4 ST (CAST SUPPLIES) IMPLANT
PENCIL SMOKE EVACUATOR (MISCELLANEOUS) ×3 IMPLANT
SHEET MEDIUM DRAPE 40X70 STRL (DRAPES) ×9 IMPLANT
SPLINT FIBERGLASS 3X35 (CAST SUPPLIES) ×3 IMPLANT
SPLINT FIBERGLASS 4X30 (CAST SUPPLIES) IMPLANT
SPONGE LAP 18X18 RF (DISPOSABLE) ×3 IMPLANT
STAPLER VISISTAT 35W (STAPLE) IMPLANT
SURGILUBE 2OZ TUBE FLIPTOP (MISCELLANEOUS) IMPLANT
SUT MON AB 5-0 PS2 18 (SUTURE) IMPLANT
SUT SILK 3 0 SH CR/8 (SUTURE) IMPLANT
SUT SILK 4 0 PS 2 (SUTURE) IMPLANT
SUT VIC AB 5-0 P-3 18X BRD (SUTURE) IMPLANT
SUT VIC AB 5-0 P3 18 (SUTURE)
SUT VIC AB 5-0 PS2 18 (SUTURE) ×30 IMPLANT
SUT VICRYL 4-0 PS2 18IN ABS (SUTURE) IMPLANT
SYR BULB IRRIGATION 50ML (SYRINGE) ×3 IMPLANT
SYR CONTROL 10ML LL (SYRINGE) ×3 IMPLANT
TOWEL GREEN STERILE FF (TOWEL DISPOSABLE) ×9 IMPLANT
TRAY DSU PREP LF (CUSTOM PROCEDURE TRAY) ×3 IMPLANT
TUBE CONNECTING 20X1/4 (TUBING) IMPLANT
UNDERPAD 30X36 HEAVY ABSORB (UNDERPADS AND DIAPERS) ×3 IMPLANT
YANKAUER SUCT BULB TIP NO VENT (SUCTIONS) IMPLANT

## 2019-08-19 NOTE — Op Note (Signed)
DATE OF OPERATION: 08/19/2019  LOCATION: [facility]  PREOPERATIVE DIAGNOSIS: Bilateral leg wound  POSTOPERATIVE DIAGNOSIS: Same  PROCEDURE:  1. Split thickness skin graft to right leg 10 x 22 cm 2. Split thickness skin graft to left leg proximal 1 x 2 cm 3. Split thickness skin graft to left leg distal 3 x 3 cm 4. Splinting of the right leg for immobility A total of 378 cm 2 was grafted  SURGEON: Challen Spainhour Sanger Sashay Felling, DO  ASSISTANT: Phoebe Sharps, PA  EBL: 5 cc  CONDITION: Stable  COMPLICATIONS: None  INDICATION: The patient, Jesse Pope, is a 69 y.o. male born on 1951/01/28, is here for treatment of bilateral leg wounds.   PROCEDURE DETAILS:  The patient was seen prior to surgery and marked.  The IV antibiotics were given. The patient was taken to the operating room and given a general anesthetic. A standard time out was performed and all information was confirmed by those in the room. SCDs were not placed due to location.  The legs were prepped and draped with Betadine.  Local with epinephrine was injected in the right upper thigh for intraoperative hemostasis and postop pain management for the donor site.    Right leg: The leg wound was measured at 10 x 22 cm and size.  The silicone sheet from the Integra was removed.  There was very good take of the Integra.  The dermatome was set at 06/999 of an inch.  The split thickness skin graft was obtained with the dermatome.  The skin was then placed on the 1 : 1.5 carrier and placed through the mesher.  The skin was then placed on the superior aspect of the right leg wound.  Care was taken to be sure the dermal side levels placed deep.  It was secured in place with a 5-0 Vicryl.  Additional skin was obtained of the right leg to total of 10 x 22 cm.  A portion of this additional piece was placed at the distal aspect of the right leg and sutured in place with 5-0 Vicryl.  The Adaptic was applied over the graft.  This  is not sutured down.  The VAC sponge was then placed and had an excellent seal.  The leg was wrapped with Kerlix and a splint was applied.  The splint was secured with the Ace wrap.  Left leg: The silicone sheet of the Integra was removed from the distal and superior wound.  The area was irrigated with saline.  The superior 1 x 2 cm wound was covered with the skin graft and sutured in place.  The 5-0 Vicryl was used to secure it.  The distal 3 x 3 cm wound was covered with the skin graft.  The skin graft was secured with a 5-0 Vicryl.  Adaptic was applied to both wounds.  A Xeroform bolster dressing was sutured in place with a 5-0 Vicryl a Xeroform dressing was applied to the proximal wound.  The leg was wrapped with Kerlix and an Ace wrap.  The patient was allowed to wake up and taken to recovery room in stable condition at the end of the case. The family was notified at the end of the case.   The advanced practice practitioner (APP) assisted throughout the case.  The APP was essential in retraction and counter traction when needed to make the case progress smoothly.  This retraction and assistance made it possible to see the tissue plans for the procedure.  The  assistance was needed for blood control, tissue re-approximation and assisted with closure of the incision site.  The [place] was signed into law in 2016 which includes the topic of electronic health records.  This provides immediate access to information in MyChart.  This includes consultation notes, operative notes, office notes, lab results and pathology reports.  If you have any questions about what you read please let us know at your next visit or call us at the office.  We are right here with you.

## 2019-08-19 NOTE — Interval H&P Note (Signed)
History and Physical Interval Note:  08/19/2019 9:47 AM  Jesse Pope  has presented today for surgery, with the diagnosis of open wound of lower leg, SCC of lower right leg.  The various methods of treatment have been discussed with the patient and family. After consideration of risks, benefits and other options for treatment, the patient has consented to  Procedure(s) with comments: SKIN GRAFT SPLIT THICKNESS to bilateral legs with home VAC placement (Bilateral) - 90 min, please as a surgical intervention.  The patient's history has been reviewed, patient examined, no change in status, stable for surgery.  I have reviewed the patient's chart and labs.  Questions were answered to the patient's satisfaction.     Loel Lofty Gio Janoski

## 2019-08-19 NOTE — Discharge Instructions (Addendum)
INSTRUCTIONS FOR AFTER SURGERY   You will likely have some questions about what to expect following your operation.  The following information will help you and your family understand what to expect when you are discharged from the hospital.  Following these guidelines will help ensure a smooth recovery and reduce risks of complications.  Postoperative instructions include information on: diet, wound care, medications and physical activity.  AFTER SURGERY Expect to go home after the procedure.  In some cases, you may need to spend one night in the hospital for observation.  DIET This surgery does not require a specific diet.  However, I have to mention that the healthier you eat the better your body can start healing. It is important to increasing your protein intake.  This means limiting the foods with added sugar.  Focus on fruits and vegetables and some meat.   If your urine is bright yellow, then it is concentrated, and you need to drink more water.  As a general rule after surgery, you should have 8 ounces of water every hour while awake.  If you find you are persistently nauseated or unable to take in liquids let us know.  NO TOBACCO USE or EXPOSURE.  This will slow your healing process and increase the risk of a wound.  WOUND CARE Do NOT get the legs wet.   Do NOT remove the dressings. Keep leg elevated as able. Keep the [facility] running and do not disconnect.  ACTIVITY No heavy lifting until cleared by the doctor.  It is OK to walk and climb stairs.  Every 1 to 2 hours get up and walk for 5 minutes. This will help with a quicker recovery back to normal.  Let pain be your guide so you don't do too much.    WORK Everyone returns to work at different times. As a rough guide, most people take at least 1 - 2 weeks off prior to returning to work. If you need documentation for your job, bring the forms to your postoperative follow up visit.  DRIVING Arrange for someone to bring you home from the  hospital.  You may be able to drive a few days after surgery but not while taking any narcotics or valium.  BOWEL MOVEMENTS Constipation can occur after anesthesia and while taking pain medication.  It is important to stay ahead for your comfort.  We recommend taking Milk of Magnesia (2 tablespoons; twice a day) while taking the pain pills.  MEDICATIONS and PAIN CONTROL At your preoperative visit for you history and physical you were given the following medications: 1. An antibiotic: Start this medication when you get home and take according to the instructions on the bottle. 2. Zofran 4 mg:  This is to treat nausea and vomiting.  You can take this every 6 hours as needed and only if needed. 3. Norco (hydrocodone/acetaminophen) 5/325 mg:  This is only to be used after you have taken the motrin or the tylenol. Every 8 hours as needed. Over the counter Medication to take: 4. Ibuprofen (Motrin) 600 mg:  Take this every 6 hours.  If you have additional pain then take 500 mg of the tylenol.  Only take the Norco after you have tried these two. 5. Miralax or stool softener of choice: Take this according to the bottle if you take the [place] Call your surgeon's office if any of the following occur: . Fever 101 degrees F or greater . Excessive bleeding or fluid  from the incision site. . Pain that increases over time without aid from the medications . Redness, warmth, or pus draining from incision sites . Persistent nausea or inability to take in liquids . Severe misshapen area that underwent the operation.    Post Anesthesia Home Care Instructions  Activity: Get plenty of rest for the remainder of the day. A responsible individual must stay with you for 24 hours following the procedure.  For the next 24 hours, DO NOT: -Drive a car -Paediatric nurse -Drink alcoholic beverages -Take any medication unless instructed by your physician -Make any legal decisions or sign important  papers.  Meals: Start with liquid foods such as gelatin or soup. Progress to regular foods as tolerated. Avoid greasy, spicy, heavy foods. If nausea and/or vomiting occur, drink only clear liquids until the nausea and/or vomiting subsides. Call your physician if vomiting continues.  Special Instructions/Symptoms: Your throat may feel dry or sore from the anesthesia or the breathing tube placed in your throat during surgery. If this causes discomfort, gargle with warm salt water. The discomfort should disappear within 24 hours.  If you had a scopolamine patch placed behind your ear for the management of post- operative nausea and/or vomiting:  1. The medication in the patch is effective for 72 hours, after which it should be removed.  Wrap patch in a tissue and discard in the trash. Wash hands thoroughly with soap and water. 2. You may remove the patch earlier than 72 hours if you experience unpleasant side effects which may include dry mouth, dizziness or visual disturbances. 3. Avoid touching the patch. Wash your hands with soap and water after contact with the patch.

## 2019-08-19 NOTE — Anesthesia Postprocedure Evaluation (Signed)
Anesthesia Post Note  Patient: Jesse Pope  Procedure(s) Performed: SPLIT THICKNESS SKIN GRAFT to bilateral lower legs from right thigh with wound VAC placement to right lower leg (Bilateral Leg Lower) APPLICATION OF WOUND VAC TO RIGHT LOWER LEG (Right Leg Lower)     Patient location during evaluation: PACU Anesthesia Type: General Level of consciousness: awake and alert and oriented Pain management: pain level controlled Vital Signs Assessment: post-procedure vital signs reviewed and stable Respiratory status: spontaneous breathing, nonlabored ventilation and respiratory function stable Cardiovascular status: blood pressure returned to baseline Postop Assessment: no apparent nausea or vomiting Anesthetic complications: no    Last Vitals:  Vitals:   08/19/19 1138 08/19/19 1145  BP: (!) 145/71 128/85  Pulse: 96 (!) 150  Resp: (!) 21 16  Temp: 36.4 C   SpO2: 100% 97%    Last Pain:  Vitals:   08/19/19 1200  TempSrc:   PainSc: Mark

## 2019-08-19 NOTE — Anesthesia Procedure Notes (Signed)
Procedure Name: LMA Insertion Date/Time: 08/19/2019 9:56 AM Performed by: Signe Colt, CRNA Pre-anesthesia Checklist: Patient identified, Emergency Drugs available, Suction available and Patient being monitored Patient Re-evaluated:Patient Re-evaluated prior to induction Oxygen Delivery Method: Circle system utilized Preoxygenation: Pre-oxygenation with 100% oxygen Induction Type: IV induction Ventilation: Mask ventilation without difficulty LMA: LMA inserted LMA Size: 4.0 Number of attempts: 1 Airway Equipment and Method: Bite block Placement Confirmation: positive ETCO2 Tube secured with: Tape Dental Injury: Teeth and Oropharynx as per pre-operative assessment

## 2019-08-19 NOTE — Transfer of Care (Signed)
Immediate Anesthesia Transfer of Care Note  Patient: Jesse Pope  Procedure(s) Performed: SPLIT THICKNESS SKIN GRAFT to bilateral lower legs from right thigh with home VAC placement (Bilateral Leg Lower)  Patient Location: PACU  Anesthesia Type:General  Level of Consciousness: awake, alert , oriented and patient cooperative  Airway & Oxygen Therapy: Patient Spontanous Breathing and Patient connected to face mask oxygen  Post-op Assessment: Report given to RN and Post -op Vital signs reviewed and stable  Post vital signs: Reviewed and stable  Last Vitals:  Vitals Value Taken Time  BP 145/71 08/19/19 1131  Temp    Pulse 97 08/19/19 1133  Resp 21 08/19/19 1133  SpO2 100 % 08/19/19 1133  Vitals shown include unvalidated device data.  Last Pain:  Vitals:   08/19/19 0920  TempSrc: Oral  PainSc: 3       Patients Stated Pain Goal: 3 ([id number])  Complications: No apparent anesthesia complications

## 2019-08-19 NOTE — Anesthesia Preprocedure Evaluation (Addendum)
Anesthesia Evaluation  Patient identified by MRN, date of birth, ID band Patient awake    Reviewed: Allergy & Precautions, NPO status , Patient's Chart, lab work & pertinent test results  History of Anesthesia Complications Negative for: history of anesthetic complications  Airway Mallampati: II  TM Distance: >3 FB Neck ROM: Full    Dental  (+) Edentulous Upper, Edentulous Lower   Pulmonary Current Smoker,    Pulmonary exam normal        Cardiovascular hypertension, Pt. on medications Normal cardiovascular exam     Neuro/Psych negative neurological ROS  negative psych ROS   GI/Hepatic negative GI ROS, Hx of EtOH abuse   Endo/Other  negative endocrine ROS  Renal/GU negative Renal ROS  negative genitourinary   Musculoskeletal  (+) Arthritis ,   Abdominal   Peds  Hematology negative hematology ROS (+)   Anesthesia Other Findings Day of surgery medications reviewed with patient.  Reproductive/Obstetrics negative OB ROS                            Anesthesia Physical Anesthesia Plan  ASA: II  Anesthesia Plan: General   Post-op Pain Management:    Induction: Intravenous  PONV Risk Score and Plan: 2 and Treatment may vary due to age or medical condition and Ondansetron  Airway Management Planned: LMA  Additional Equipment: None  Intra-op Plan:   Post-operative Plan: Extubation in OR  Informed Consent: I have reviewed the patients History and Physical, chart, labs and discussed the procedure including the risks, benefits and alternatives for the proposed anesthesia with the patient or authorized representative who has indicated his/her understanding and acceptance.     Dental advisory given  Plan Discussed with: CRNA  Anesthesia Plan Comments:        Anesthesia Quick Evaluation

## 2019-08-21 DIAGNOSIS — C44712 Basal cell carcinoma of skin of right lower limb, including hip: Secondary | ICD-10-CM | POA: Diagnosis not present

## 2019-08-21 DIAGNOSIS — N401 Enlarged prostate with lower urinary tract symptoms: Secondary | ICD-10-CM | POA: Diagnosis not present

## 2019-08-21 DIAGNOSIS — Z4801 Encounter for change or removal of surgical wound dressing: Secondary | ICD-10-CM | POA: Diagnosis not present

## 2019-08-21 DIAGNOSIS — M1990 Unspecified osteoarthritis, unspecified site: Secondary | ICD-10-CM | POA: Diagnosis not present

## 2019-08-21 DIAGNOSIS — Z483 Aftercare following surgery for neoplasm: Secondary | ICD-10-CM | POA: Diagnosis not present

## 2019-08-21 DIAGNOSIS — I10 Essential (primary) hypertension: Secondary | ICD-10-CM | POA: Diagnosis not present

## 2019-08-21 DIAGNOSIS — S81801A Unspecified open wound, right lower leg, initial encounter: Secondary | ICD-10-CM | POA: Diagnosis not present

## 2019-08-21 DIAGNOSIS — Z48298 Encounter for aftercare following other organ transplant: Secondary | ICD-10-CM | POA: Diagnosis not present

## 2019-08-21 DIAGNOSIS — R351 Nocturia: Secondary | ICD-10-CM | POA: Diagnosis not present

## 2019-08-21 DIAGNOSIS — C44719 Basal cell carcinoma of skin of left lower limb, including hip: Secondary | ICD-10-CM | POA: Diagnosis not present

## 2019-08-24 ENCOUNTER — Telehealth: Payer: Self-pay

## 2019-08-24 NOTE — Telephone Encounter (Signed)

## 2019-08-24 NOTE — Progress Notes (Signed)
Mr. Bonifas underwent split thickness skin graft to the right lower leg 10 x 22 cm, split thickness skin graft to the left lower leg proximal 1 x 2 cm, and split thickness skin graft to the left lower leg distal 3 x 3 cm on 08/19/2019 with Dr. Marla Roe. A wound VAC was placed on the graft at time of surgery. Skin graft donor site right thigh.   Overall Mr. Blitch feels he's doing well.  Reports the graft donor site hurts a lot, but the other areas are not too bad. Right lower leg graft site doing well. VAC removed.  Left graft sites healing well. No signs of infection, redness, swelling on any of graft sites. Donor site shows signs of irritation redness on superior medial edge.   Every other day dressing changes to lower extremity graft sites bilaterally: Xeroform, ABD, Kerlix, (replace splint - right leg), and Ace Wrap.  For graft donor site on right thigh leave Xeroform in place until it falls off. Daily dressing changes of ABD secure with paper tape.   No showering.  Follow up in 10 days.

## 2019-08-25 ENCOUNTER — Encounter: Payer: Self-pay | Admitting: *Deleted

## 2019-08-25 ENCOUNTER — Ambulatory Visit (INDEPENDENT_AMBULATORY_CARE_PROVIDER_SITE_OTHER): Payer: Medicare HMO | Admitting: Plastic Surgery

## 2019-08-25 ENCOUNTER — Telehealth: Payer: Self-pay | Admitting: *Deleted

## 2019-08-25 ENCOUNTER — Other Ambulatory Visit: Payer: Self-pay

## 2019-08-25 ENCOUNTER — Telehealth: Payer: Self-pay | Admitting: Plastic Surgery

## 2019-08-25 VITALS — BP 146/71 | HR 87 | Temp 97.1°F | Ht 70.0 in | Wt 168.4 lb

## 2019-08-25 DIAGNOSIS — S81809D Unspecified open wound, unspecified lower leg, subsequent encounter: Secondary | ICD-10-CM

## 2019-08-25 NOTE — Telephone Encounter (Signed)
Called patient and advised we were sending in another order of supplies to [facility]. Faxed order to [facility]

## 2019-08-25 NOTE — Telephone Encounter (Signed)
On (08/21/19)-Per Dr. Dillingham-Cancel home health.  Faxed orders on (08/21/19) to [facility] further visits will be required, and discontinue Home Health.  Confirmation received.//AB/CMA

## 2019-08-25 NOTE — Telephone Encounter (Signed)
Patient called to say that he is unable to order any more supplies for wound care until next Friday and that's too late so he would like for [facility] or Dr. Marla Roe to give him a call.

## 2019-08-26 DIAGNOSIS — S81809A Unspecified open wound, unspecified lower leg, initial encounter: Secondary | ICD-10-CM | POA: Diagnosis not present

## 2019-09-01 ENCOUNTER — Telehealth: Payer: Self-pay

## 2019-09-01 DIAGNOSIS — S42322D Displaced transverse fracture of shaft of humerus, left arm, subsequent encounter for fracture with routine healing: Secondary | ICD-10-CM | POA: Diagnosis not present

## 2019-09-01 NOTE — Telephone Encounter (Signed)
Faxed signed orders prior to expiration of contract along with acknowledgement of discharge from [facility] effective 08/20/19

## 2019-09-03 ENCOUNTER — Telehealth: Payer: Self-pay | Admitting: Plastic Surgery

## 2019-09-03 NOTE — Progress Notes (Signed)
Mr. Papendick underwent split thickness skin graft (10 x 22 cm) to the right lower leg, split thickness skin graft (1 x 2 cm) to the left lower leg proximal, and split thickness skin graft (E 3 x 3 cm) to the left lower leg distal on 08/19/2019 with Dr. Marla Roe.  Skin graft donor site was the right thigh.  Today Mr. Brigham reports he is doing very well.  Xeroform fell off the graft donor site on his right thigh. Site is healing well. Site is red (as expected), no signs of drainage, infection.  Right lower leg wound skin graft is healing well.  Granulation tissue forming and epithelization occurring.  Left lower leg wounds healing well, good granulation tissue and epithelization occurring.   May shower. Daily dressing changes of collagen (or xeroform), ABD, Kerlix, and Ace Wrap.  No longer need to wear the splint.  May moisturize the donor graft site (upper right thigh) and skin around the wounds with vaseline or unscented lotion.  Follow-up in 2 weeks. Call office with any questions or concerns.   The [place] was signed into law in 2016 which includes the topic of electronic health records.  This provides immediate access to information in MyChart.  This includes consultation notes, operative notes, office notes, lab results and pathology reports.  If you have any questions about what you read please let us know at your next visit or call us at the office.  We are right here with you.

## 2019-09-03 NOTE — Telephone Encounter (Signed)

## 2019-09-04 ENCOUNTER — Other Ambulatory Visit: Payer: Self-pay

## 2019-09-04 ENCOUNTER — Ambulatory Visit (INDEPENDENT_AMBULATORY_CARE_PROVIDER_SITE_OTHER): Payer: Medicare HMO | Admitting: Plastic Surgery

## 2019-09-04 ENCOUNTER — Encounter: Payer: Self-pay | Admitting: Plastic Surgery

## 2019-09-04 ENCOUNTER — Telehealth: Payer: Self-pay

## 2019-09-04 VITALS — BP 103/70 | HR 61 | Temp 97.8°F | Ht 65.0 in | Wt 163.6 lb

## 2019-09-04 DIAGNOSIS — S81809A Unspecified open wound, unspecified lower leg, initial encounter: Secondary | ICD-10-CM | POA: Diagnosis not present

## 2019-09-04 DIAGNOSIS — S81809D Unspecified open wound, unspecified lower leg, subsequent encounter: Secondary | ICD-10-CM

## 2019-09-04 NOTE — Telephone Encounter (Signed)
Faxed order for medical supplies to [facility].

## 2019-09-07 ENCOUNTER — Telehealth: Payer: Self-pay | Admitting: *Deleted

## 2019-09-07 NOTE — Telephone Encounter (Signed)
Received Episode Detail Report via of fax on (09/02/19) from [facility] for provider to review.    Provider reviewed and initialed.  Placed to be scanned in chart.//AB/CMA

## 2019-09-08 ENCOUNTER — Encounter: Payer: Medicare HMO | Admitting: Plastic Surgery

## 2019-09-09 ENCOUNTER — Ambulatory Visit: Payer: Medicare HMO

## 2019-09-17 NOTE — Progress Notes (Signed)
Patient is a 69 year old male who underwent split thickness skin graft (10 x 22 cm) to the right lower leg, split thickness skin graft (1 x 2 cm) to the left lower leg proximal, and split thickness skin graft (3 x 3 cm) to the left lower leg distal on 08/19/2019 with Dr. Marla Roe.  Skin graft donor site was the right thigh.  Jesse Pope reports he is doing very well today.  Wounds on left leg have completely closed.  Wound on right leg has good epithelialization on approximately half of the wound and good granulation tissue on the other half.  Continue with daily dressing changes of collagen on the right leg area with granulation tissue present (9 x 12 x 0.5 cm), apply Vaseline to remaining area of right leg wound, ABD, Kerlix, and Ace wrap.  For left leg apply Vaseline to wound areas, cover and wrap as appropriate.  For graft donor site on upper right thigh apply Vaseline, baby oil gel, Aquaphor, or other unscented moisturizer.   Follow-up in 1 month.  Call office with any questions/concerns or if condition worsens.  The [place] was signed into law in 2016 which includes the topic of electronic health records.  This provides immediate access to information in MyChart.  This includes consultation notes, operative notes, office notes, lab results and pathology reports.  If you have any questions about what you read please let us know at your next visit or call us at the office.  We are right here with you.

## 2019-09-18 ENCOUNTER — Other Ambulatory Visit: Payer: Self-pay

## 2019-09-18 ENCOUNTER — Encounter: Payer: Self-pay | Admitting: Plastic Surgery

## 2019-09-18 ENCOUNTER — Telehealth: Payer: Self-pay

## 2019-09-18 ENCOUNTER — Ambulatory Visit (INDEPENDENT_AMBULATORY_CARE_PROVIDER_SITE_OTHER): Payer: Medicare HMO | Admitting: Plastic Surgery

## 2019-09-18 VITALS — BP 130/84 | HR 60 | Temp 97.8°F | Ht 70.0 in | Wt 164.0 lb

## 2019-09-18 DIAGNOSIS — S81809D Unspecified open wound, unspecified lower leg, subsequent encounter: Secondary | ICD-10-CM

## 2019-09-18 NOTE — Telephone Encounter (Signed)
Faxed medical supplies to [facility] 

## 2019-09-21 ENCOUNTER — Telehealth: Payer: Self-pay

## 2019-09-21 NOTE — Telephone Encounter (Signed)
Patient would like a call back regarding medical supplies. He has been informed by his insurance company that a prior approval is needed, and [facility] informed him that he would need to pay for the supplies himself.

## 2019-09-21 NOTE — Telephone Encounter (Signed)
Called [facility], they can not send out more Collagen until 10/04/19 due to original order on 09/04/19 for 60 days. New order for collagen was made on 09/18/19 not knowing  he already had an exisiting order. Called patient and advised. He stated he beem had using up all the collagen sheets in order to cover the wound. Indicated to  patient that [facility] is suppose to send out enough supplies and goes the dimension on the application. If he needs more in between, he will have to pay out of pocket.

## 2019-09-21 NOTE — Telephone Encounter (Signed)
Spoke with Venetia Night and she advised that patient is ok to use xeroform until the collagen comes in. Called Mr. Segerstrom, [place] on both VM's.

## 2019-10-12 NOTE — Progress Notes (Signed)
Patient is a 69 year old male who underwent split thickness skin grafts to the right lower leg, left lower leg proximal, and left lower leg distal on 08/19/2019 with Dr. Marla Roe.  Skin graft donor site was the right thigh.  Today he presents wearing shorts for the first time in a long time as he used to feel like he needed to hide his lower legs. Denies F, CP, SOB, N/V. Right leg - 9 x 12 x 0.5 cm area with exudate and minimal drainage. Skin graft has taken well. Left lower leg skin grafts have taken. Skin grafts are still pink in color. No signs of infection, seroma/hematoma.   Do daily dressing changes of xeroform on right leg area 9 x 12 cm. Continue to apply daily vaseline for moisture to remainder of right leg skin graft and both left leg grafts.  Follow up in 6 weeks. Call office with any questions or concerns.   Pictures were obtained of the patient and placed in the chart with the patient's or guardian's permission.

## 2019-10-13 ENCOUNTER — Ambulatory Visit: Payer: Medicare HMO | Admitting: Plastic Surgery

## 2019-10-13 ENCOUNTER — Encounter: Payer: Self-pay | Admitting: Plastic Surgery

## 2019-10-13 ENCOUNTER — Other Ambulatory Visit: Payer: Self-pay

## 2019-10-13 VITALS — BP 157/79 | HR 66 | Temp 98.6°F | Ht 70.0 in | Wt 165.0 lb

## 2019-10-13 DIAGNOSIS — Z945 Skin transplant status: Secondary | ICD-10-CM | POA: Insufficient documentation

## 2019-10-13 NOTE — Progress Notes (Signed)
Order for wound supplies faxed to [facility]- per Phoebe Sharps, PA

## 2019-10-14 DIAGNOSIS — S81809A Unspecified open wound, unspecified lower leg, initial encounter: Secondary | ICD-10-CM | POA: Diagnosis not present

## 2019-11-09 ENCOUNTER — Telehealth: Payer: Self-pay | Admitting: Hematology and Oncology

## 2019-11-09 NOTE — Telephone Encounter (Signed)
Rescheduled Dr. Maylon Peppers pt to Dr. Calton Dach schedule. Pt confirmed new appt date and time.

## 2019-11-24 ENCOUNTER — Ambulatory Visit (INDEPENDENT_AMBULATORY_CARE_PROVIDER_SITE_OTHER): Payer: Medicare HMO | Admitting: Surgical

## 2019-11-24 ENCOUNTER — Encounter: Payer: Self-pay | Admitting: Surgical

## 2019-11-24 ENCOUNTER — Other Ambulatory Visit: Payer: Self-pay

## 2019-11-24 VITALS — BP 158/89 | HR 71 | Temp 98.4°F | Ht 69.0 in | Wt 172.0 lb

## 2019-11-24 DIAGNOSIS — Z945 Skin transplant status: Secondary | ICD-10-CM

## 2019-11-24 MED ORDER — TRIAMCINOLONE ACETONIDE 0.025 % EX OINT: 1.0000 "application " | TOPICAL_OINTMENT | Freq: Two times a day (BID) | CUTANEOUS | 0 refills | Status: AC

## 2019-11-24 NOTE — Progress Notes (Signed)
   Subjective:     Patient ID: Jesse Pope, male    DOB: 1950-12-31, 69 y.o.   MRN: [id number]  Chief Complaint  Patient presents with  . Follow-up    HPI: The patient is a 69 y.o. male here for follow-up after split-thickness skin graft to bilateral lower legs on 08/19/2019 with Dr. Marla Roe.  Patient initially had extensive skin cancer of bilateral lower legs which was excised and subsequently needed skin grafts.  Upon discussion today patient reports that biopsies prior to skin graft were actually not cancer, he reports he had been treating this with multiple different agents over the years with assistance of his dermatologist.  Today he is doing well, he does have 2 small wounds on the surface of his right lower extremity skin graft, he reports he bumped it when crawling under his house.  He has some irritation distal to his right lower extremity graft as well as medial to the left lower extremity graft.  He reports he has been applying Vaseline to bilateral lower extremity skin grafts, sometimes applying Neosporin to right lower extremity irritated skin.  He has some additional lesions on his left lower extremity, they are raised, nontender.  He reports he has had these in the past and they were excised, they were noncancerous.  He is planning to have these evaluated by his PCP in the next few months.   Review of Systems  Constitutional: Negative.   Cardiovascular: Negative.   Skin: Positive for rash and wound.    Objective:   Vital Signs BP (!) 158/89 (BP Location: Right Arm, Patient Position: Sitting, Cuff Size: Normal)   Pulse 71   Temp 98.4 F (36.9 C) (Temporal)   Ht 5\' 9"  (1.753 m)   Wt 172 lb (78 kg)   SpO2 100%   BMI 25.40 kg/m  Vital Signs and Nursing Note Reviewed Physical Exam  Constitutional: He is oriented to person, place, and time and well-developed, well-nourished, and in no distress.  Pulmonary/Chest: Effort normal.  Musculoskeletal:        General:  No tenderness or edema.  Neurological: He is alert and oriented to person, place, and time. Gait normal.  Skin: Skin is warm and dry. He is not diaphoretic.        Assessment/Plan:     ICD-10-CM   1. S/P split thickness skin graft  Z94.5     Recommend continuing to apply Vaseline to small wounds on the right lower extremity skin graft and small wounds on left lower extremity.    Steroid cream sent to pharmacy for patient to apply to irritation distal to right lower extremity skin graft and irritation medial to left lower extremity skin graft.  Recommend spot testing for a few days and then applying to entire area of irritated skin, avoid placing directly over skin graft.    Patient is going to visit PCP for further evaluation left lower extremity lesions, he plans to have these biopsied by them in August.  He is not worried about them.  Recommend calling if he develops any additional wounds, new lesions or changes in current skin lesions.  Pictures were obtained of the patient and placed in the chart with the patient's or guardian's permission.  Carola Rhine Anneliese Leblond, PA-C 11/24/2019, 8:29 AM

## 2019-11-27 ENCOUNTER — Telehealth: Payer: Self-pay | Admitting: Surgical

## 2019-11-27 NOTE — Telephone Encounter (Signed)
Patient called to see if the steroid cream had been called into pharmacy [facility]. Please call him to advise.

## 2019-11-27 NOTE — Telephone Encounter (Signed)
Returned patients call, advised him that the steroid cream was sent to the pharmacy on 11/24/19

## 2019-12-01 DIAGNOSIS — S42322D Displaced transverse fracture of shaft of humerus, left arm, subsequent encounter for fracture with routine healing: Secondary | ICD-10-CM | POA: Diagnosis not present

## 2020-01-29 ENCOUNTER — Other Ambulatory Visit: Payer: Self-pay | Admitting: Hematology and Oncology

## 2020-01-29 DIAGNOSIS — C44722 Squamous cell carcinoma of skin of right lower limb, including hip: Secondary | ICD-10-CM

## 2020-02-03 ENCOUNTER — Ambulatory Visit: Payer: Medicare HMO | Admitting: Hematology

## 2020-02-03 ENCOUNTER — Other Ambulatory Visit: Payer: Medicare HMO

## 2020-02-04 ENCOUNTER — Encounter: Payer: Self-pay | Admitting: Hematology and Oncology

## 2020-02-04 ENCOUNTER — Inpatient Hospital Stay: Payer: Medicare HMO | Attending: Hematology and Oncology

## 2020-02-04 ENCOUNTER — Inpatient Hospital Stay: Payer: Medicare HMO | Admitting: Hematology and Oncology

## 2020-02-04 ENCOUNTER — Other Ambulatory Visit: Payer: Self-pay

## 2020-02-04 DIAGNOSIS — Z85828 Personal history of other malignant neoplasm of skin: Secondary | ICD-10-CM

## 2020-02-04 DIAGNOSIS — Z79899 Other long term (current) drug therapy: Secondary | ICD-10-CM | POA: Insufficient documentation

## 2020-02-04 DIAGNOSIS — Z803 Family history of malignant neoplasm of breast: Secondary | ICD-10-CM | POA: Insufficient documentation

## 2020-02-04 DIAGNOSIS — D72821 Monocytosis (symptomatic): Secondary | ICD-10-CM | POA: Insufficient documentation

## 2020-02-04 DIAGNOSIS — L570 Actinic keratosis: Secondary | ICD-10-CM | POA: Diagnosis not present

## 2020-02-04 DIAGNOSIS — Z800 Family history of malignant neoplasm of digestive organs: Secondary | ICD-10-CM | POA: Insufficient documentation

## 2020-02-04 DIAGNOSIS — Z8379 Family history of other diseases of the digestive system: Secondary | ICD-10-CM | POA: Insufficient documentation

## 2020-02-04 DIAGNOSIS — C61 Malignant neoplasm of prostate: Secondary | ICD-10-CM | POA: Diagnosis not present

## 2020-02-04 DIAGNOSIS — Z720 Tobacco use: Secondary | ICD-10-CM

## 2020-02-04 DIAGNOSIS — F17210 Nicotine dependence, cigarettes, uncomplicated: Secondary | ICD-10-CM | POA: Insufficient documentation

## 2020-02-04 DIAGNOSIS — Z7289 Other problems related to lifestyle: Secondary | ICD-10-CM | POA: Diagnosis not present

## 2020-02-04 DIAGNOSIS — C44722 Squamous cell carcinoma of skin of right lower limb, including hip: Secondary | ICD-10-CM | POA: Insufficient documentation

## 2020-02-04 DIAGNOSIS — Z8546 Personal history of malignant neoplasm of prostate: Secondary | ICD-10-CM | POA: Diagnosis not present

## 2020-02-04 DIAGNOSIS — F1010 Alcohol abuse, uncomplicated: Secondary | ICD-10-CM

## 2020-02-04 DIAGNOSIS — E875 Hyperkalemia: Secondary | ICD-10-CM | POA: Diagnosis not present

## 2020-02-04 LAB — CBC WITH DIFFERENTIAL/PLATELET
Abs Immature Granulocytes: 0.04 10*3/uL (ref 0.00–0.07)
Basophils Absolute: 0.1 10*3/uL (ref 0.0–0.1)
Basophils Relative: 1 %
Eosinophils Absolute: 0.3 10*3/uL (ref 0.0–0.5)
Eosinophils Relative: 5 %
HCT: 40.9 % (ref 39.0–52.0)
Hemoglobin: 13.8 g/dL (ref 13.0–17.0)
Immature Granulocytes: 1 %
Lymphocytes Relative: 19 %
Lymphs Abs: 1.2 10*3/uL (ref 0.7–4.0)
MCH: 30.9 pg (ref 26.0–34.0)
MCHC: 33.7 g/dL (ref 30.0–36.0)
MCV: 91.7 fL (ref 80.0–100.0)
Monocytes Absolute: 1 10*3/uL (ref 0.1–1.0)
Monocytes Relative: 16 %
Neutro Abs: 3.7 10*3/uL (ref 1.7–7.7)
Neutrophils Relative %: 58 %
Platelets: 317 10*3/uL (ref 150–400)
RBC: 4.46 MIL/uL (ref 4.22–5.81)
RDW: 12.8 % (ref 11.5–15.5)
WBC: 6.3 10*3/uL (ref 4.0–10.5)
nRBC: 0 % (ref 0.0–0.2)

## 2020-02-04 LAB — COMPREHENSIVE METABOLIC PANEL
ALT: 14 U/L (ref 0–44)
AST: 16 U/L (ref 15–41)
Albumin: 3.9 g/dL (ref 3.5–5.0)
Alkaline Phosphatase: 51 U/L (ref 38–126)
Anion gap: 10 (ref 5–15)
BUN: 11 mg/dL (ref 8–23)
CO2: 24 mmol/L (ref 22–32)
Calcium: 9.6 mg/dL (ref 8.9–10.3)
Chloride: 97 mmol/L — ABNORMAL LOW (ref 98–111)
Creatinine, Ser: 1 mg/dL (ref 0.61–1.24)
GFR calc Af Amer: >60 mL/min (ref >60)
GFR calc non Af Amer: >60 mL/min (ref >60)
Glucose, Bld: 100 mg/dL — ABNORMAL HIGH (ref 70–99)
Potassium: 5.2 mmol/L — ABNORMAL HIGH (ref 3.5–5.1)
Sodium: 131 mmol/L — ABNORMAL LOW (ref 135–145)
Total Bilirubin: 1.1 mg/dL (ref 0.3–1.2)
Total Protein: 7.1 g/dL (ref 6.5–8.1)

## 2020-02-04 NOTE — Assessment & Plan Note (Signed)
He has several keratoma and suspicious skin lesions that warrant close follow-up with dermatologist Recommend the patient to schedule appointment to follow with his dermatologist The patient gave me permission to take pictures of his legs for future comparison

## 2020-02-04 NOTE — Assessment & Plan Note (Signed)
He has recurrent hyperkalemia likely due to lisinopril I recommend he contact his primary care doctor to discuss this

## 2020-02-04 NOTE — Assessment & Plan Note (Signed)
We discussed the importance of staying abstinent

## 2020-02-04 NOTE — Assessment & Plan Note (Signed)
He follows with primary care doctor for PSA monitoring I would defer to his primary care doctor for this

## 2020-02-04 NOTE — Progress Notes (Signed)
[facility] FOLLOW-UP progress notes  Patient Care Team: Lawerance Cruel, MD as PCP - General (Family Medicine)  CHIEF COMPLAINTS/PURPOSE OF VISIT:  History of skin cancer and prostate cancer  HISTORY OF PRESENTING ILLNESS:  Jesse Pope 69 y.o. male was transferred to my care after his prior physician has left.  I reviewed the patient's records extensive and collaborated the history with the patient. Summary of his history is as follows: 1. Extensive squamous cell carcinoma of the skin, involving the R lower extremity -Followed by Dr. Marla Roe of plastic surgery   2. Stage I ([id number] adenocarcinoma of the prostate, GS 4+3 -01/2018 - 03/2018: prostate RT, 70 Gy/28 fx, Dr. Tammi Klippel  -On observation   He was also found to have monocytosis with negative work-up Since last time he was seen here, he continues to smoke approximately 3 cigars/day and drinks 4-5 beer per day He has occasional weeping from the skin graft He has not seen a dermatologist for some time He does not use sunscreen on a regular basis  MEDICAL HISTORY:  Past Medical History:  Diagnosis Date  . Alcohol abuse 09/07/2015  . Arthritis   . Basal cell carcinoma of right lower leg   . Cellulitis and abscess of leg 09/07/2015  . Cigarette smoker 02/11/2014  . Eczema 02/11/2014  . Hypertension   . Hyponatremia 02/08/2014  . MRSA (methicillin resistant Staphylococcus aureus)   . Nocturia associated with benign prostatic hypertrophy 09/07/2015  . Pneumonia   . Prostate cancer ([place])    radiation  . Pulmonary nodules 09/10/2015    SURGICAL HISTORY: Past Surgical History:  Procedure Laterality Date  . APPLICATION OF A-CELL OF EXTREMITY Right 07/23/2019   Procedure: APPLICATION OF Integra OF EXTREMITY, RIGHT LEG;  Surgeon: Wallace Going, DO;  Location: [facility];  Service: Plastics;  Laterality: Right;  . APPLICATION OF WOUND VAC Right 08/19/2019   Procedure: APPLICATION OF  WOUND VAC TO RIGHT LOWER LEG;  Surgeon: Wallace Going, DO;  Location: [facility];  Service: Plastics;  Laterality: Right;  . BACK SURGERY     lower- disc  . Biospy of Right Lower Extremity  01/27/2014   SCC  . COLONOSCOPY W/ POLYPECTOMY    . EYE SURGERY Left    cataract  . Heel fracture     patient doesnt remember which one  . I & D EXTREMITY Left 02/10/2014   Procedure: IRRIGATION AND DEBRIDEMENT EXTREMITY/PRE PATELLA BURSA;  Surgeon: Marin Shutter, MD;  Location: [facility];  Service: Orthopedics;  Laterality: Left;  . I & D EXTREMITY Left 02/15/2014   Procedure: IRRIGATION AND DEBRIDEMENT EXTREMITY/DELAY CLOSURE OF LEFT KNEE;  Surgeon: Marin Shutter, MD;  Location: [facility];  Service: Orthopedics;  Laterality: Left;  . LESION REMOVAL Bilateral 07/23/2019   Procedure: Excision of changing skin lesions bilateral legs;  Surgeon: Wallace Going, DO;  Location: [facility];  Service: Plastics;  Laterality: Bilateral;  . ORIF HUMERUS FRACTURE Left 05/20/2019   Procedure: OPEN REDUCTION INTERNAL FIXATION (ORIF) HUMERUS FRACTURE;  Surgeon: Nicholes Stairs, MD;  Location: [facility];  Service: Orthopedics;  Laterality: Left;  120 mins  . RETINAL DETACHMENT SURGERY Left   . SKIN SPLIT GRAFT Bilateral 08/19/2019   Procedure: SPLIT THICKNESS SKIN GRAFT to bilateral lower legs from right thigh with wound VAC placement to right lower leg;  Surgeon: Wallace Going, DO;  Location: [facility];  Service: Plastics;  Laterality:  Bilateral;  90 min, please    SOCIAL HISTORY: Social History   Socioeconomic History  . Marital status: Married    Spouse name: Not on file  . Number of children: 3  . Years of education: Not on file  . Highest education level: Not on file  Occupational History  . Occupation: Development worker, community    Comment: limited hours  Tobacco Use  . Smoking status: Current Every Day Smoker    Packs/day: 0.00    Years: 51.00    Pack years: 0.00     Types: Cigars  . Smokeless tobacco: Never Used  . Tobacco comment: 2-3 per day  Vaping Use  . Vaping Use: Never used  Substance and Sexual Activity  . Alcohol use: Yes    Alcohol/week: 0.0 standard drinks    Comment: Drinks Alcohol daily, 4-6 beers daily  . Drug use: No  . Sexual activity: Not Currently  Other Topics Concern  . Not on file  Social History Narrative   Patient resides in [place] independently. Patient has two daughters and one son. Does not have any grandchildren yet. Works limited hours as a Development worker, community.   Social Determinants of Health   Financial Resource Strain:   . Difficulty of Paying Living Expenses:   Food Insecurity:   . Worried About Charity fundraiser in the Last Year:   . Arboriculturist in the Last Year:   Transportation Needs:   . Film/video editor (Medical):   Marland Kitchen Lack of Transportation (Non-Medical):   Physical Activity:   . Days of Exercise per Week:   . Minutes of Exercise per Session:   Stress:   . Feeling of Stress :   Social Connections:   . Frequency of Communication with Friends and Family:   . Frequency of Social Gatherings with Friends and Family:   . Attends Religious Services:   . Active Member of Clubs or Organizations:   . Attends Archivist Meetings:   Marland Kitchen Marital Status:   Intimate Partner Violence:   . Fear of Current or Ex-Partner:   . Emotionally Abused:   Marland Kitchen Physically Abused:   . Sexually Abused:     FAMILY HISTORY: Family History  Problem Relation Age of Onset  . Colon cancer Mother   . Crohn's disease Father   . Breast cancer Sister     ALLERGIES:  is allergic to penicillins.  MEDICATIONS:  Current Outpatient Medications  Medication Sig Dispense Refill  . amLODipine (NORVASC) 10 MG tablet Take 10 mg by mouth daily.    . diphenhydrAMINE (BENADRYL) 25 MG tablet Take 25 mg by mouth every morning.    Marland Kitchen FLUAD QUADRIVALENT 0.5 ML injection     . loperamide (IMODIUM) 1 MG/5ML solution Take 4 mg by  mouth as needed for diarrhea or loose stools.    . methocarbamol (ROBAXIN) 500 MG tablet Take 1 tablet (500 mg total) by mouth every 6 (six) hours as needed for muscle spasms. 30 tablet 5  . ondansetron (ZOFRAN ODT) 4 MG disintegrating tablet Take 1 tablet (4 mg total) by mouth every 8 (eight) hours as needed. 20 tablet 0  . ramipril (ALTACE) 10 MG capsule Take 10 mg by mouth daily.    Marland Kitchen triamcinolone (KENALOG) 0.025 % ointment Apply 1 application topically 2 (two) times daily. 30 g 0   No current facility-administered medications for this visit.    REVIEW OF SYSTEMS:   Constitutional: Denies fevers, chills or abnormal night sweats Eyes: Denies  blurriness of vision, double vision or watery eyes Ears, nose, mouth, throat, and face: Denies mucositis or sore throat Respiratory: Denies cough, dyspnea or wheezes Cardiovascular: Denies palpitation, chest discomfort or lower extremity swelling Gastrointestinal:  Denies nausea, heartburn or change in bowel habits Lymphatics: Denies new lymphadenopathy or easy bruising Neurological:Denies numbness, tingling or new weaknesses Behavioral/Psych: Mood is stable, no new changes  All other systems were reviewed with the patient and are negative.  PHYSICAL EXAMINATION: ECOG PERFORMANCE STATUS: 1 - Symptomatic but completely ambulatory  Vitals:   02/04/20 0840  BP: 136/69  Pulse: 63  Resp: 18  Temp: 98 F (36.7 C)  SpO2: 99%   Filed Weights   02/04/20 0840  Weight: 165 lb 12.8 oz (75.2 kg)    GENERAL:alert, no distress and comfortable SKIN: There is a small skin break on the right lower extremity.  I placed him nonadhesive bandages over it EYES: normal, conjunctiva are pink and non-injected, sclera clear OROPHARYNX:no exudate, normal lips, buccal mucosa, and tongue  NECK: supple, thyroid normal size, non-tender, without nodularity LYMPH:  no palpable lymphadenopathy in the cervical, axillary or inguinal LUNGS: clear to auscultation and  percussion with normal breathing effort HEART: regular rate & rhythm and no murmurs without lower extremity edema ABDOMEN:abdomen soft, non-tender and normal bowel sounds Musculoskeletal:no cyanosis of digits and no clubbing  PSYCH: alert & oriented x 3 with fluent speech NEURO: no focal motor/sensory deficits  LABORATORY DATA:  I have reviewed the data as listed Lab Results  Component Value Date   WBC 6.3 02/04/2020   HGB 13.8 02/04/2020   HCT 40.9 02/04/2020   MCV 91.7 02/04/2020   PLT 317 02/04/2020   Recent Labs    05/06/19 0837 05/08/19 1013 05/20/19 1019 08/05/19 0803 02/04/20 0826  NA 131*   < > 128* 128* 131*  K 4.3   < > 4.0 4.6 5.2*  CL 97*   < > 96* 96* 97*  CO2 23   < > 23 23 24   GLUCOSE 106*   < > 127* 99 100*  BUN 9   < > 10 9 11   CREATININE 0.98   < > 0.94 0.98 1.00  CALCIUM 9.1   < > 8.7* 8.4* 9.6  GFRNONAA >60   < > >60 >60 >60  GFRAA >60   < > >60 >60 >60  PROT 7.5  --   --  6.9 7.1  ALBUMIN 4.1  --   --  3.4* 3.9  AST 14*  --   --  10* 16  ALT 13  --   --  8 14  ALKPHOS 55  --   --  89 51  BILITOT 1.0  --   --  0.7 1.1   < > = values in this interval not displayed.        ASSESSMENT & PLAN:  History of skin cancer He has several keratoma and suspicious skin lesions that warrant close follow-up with dermatologist Recommend the patient to schedule appointment to follow with his dermatologist The patient gave me permission to take pictures of his legs for future comparison  History of prostate cancer He follows with primary care doctor for PSA monitoring I would defer to his primary care doctor for this  Alcohol abuse We discussed the importance of staying abstinent  Tobacco abuse We discussed the importance of nicotine cessation  Hyperkalemia He has recurrent hyperkalemia likely due to lisinopril I recommend he contact his primary care doctor to discuss this  No orders of the defined types were placed in this encounter.   All  questions were answered. The patient knows to call the clinic with any problems, questions or concerns. The total time spent in the appointment was 25 minutes encounter with patients including review of chart and various tests results, discussions about plan of care and coordination of care plan   [name], MD 02/04/2020 9:35 AM

## 2020-02-04 NOTE — Assessment & Plan Note (Signed)
We discussed the importance of nicotine cessation  

## 2020-03-11 DIAGNOSIS — Z0000 Encounter for general adult medical examination without abnormal findings: Secondary | ICD-10-CM | POA: Diagnosis not present

## 2020-03-11 DIAGNOSIS — M21372 Foot drop, left foot: Secondary | ICD-10-CM | POA: Diagnosis not present

## 2020-03-11 DIAGNOSIS — C61 Malignant neoplasm of prostate: Secondary | ICD-10-CM | POA: Diagnosis not present

## 2020-03-11 DIAGNOSIS — Z23 Encounter for immunization: Secondary | ICD-10-CM | POA: Diagnosis not present

## 2020-03-11 DIAGNOSIS — F1010 Alcohol abuse, uncomplicated: Secondary | ICD-10-CM | POA: Diagnosis not present

## 2020-03-11 DIAGNOSIS — A4902 Methicillin resistant Staphylococcus aureus infection, unspecified site: Secondary | ICD-10-CM | POA: Diagnosis not present

## 2020-03-11 DIAGNOSIS — F17200 Nicotine dependence, unspecified, uncomplicated: Secondary | ICD-10-CM | POA: Diagnosis not present

## 2020-03-11 DIAGNOSIS — I10 Essential (primary) hypertension: Secondary | ICD-10-CM | POA: Diagnosis not present

## 2020-03-11 DIAGNOSIS — J449 Chronic obstructive pulmonary disease, unspecified: Secondary | ICD-10-CM | POA: Diagnosis not present

## 2020-04-08 DIAGNOSIS — Z0000 Encounter for general adult medical examination without abnormal findings: Secondary | ICD-10-CM | POA: Diagnosis not present

## 2020-04-08 DIAGNOSIS — I10 Essential (primary) hypertension: Secondary | ICD-10-CM | POA: Diagnosis not present

## 2020-04-08 DIAGNOSIS — A4902 Methicillin resistant Staphylococcus aureus infection, unspecified site: Secondary | ICD-10-CM | POA: Diagnosis not present

## 2020-04-08 DIAGNOSIS — F17200 Nicotine dependence, unspecified, uncomplicated: Secondary | ICD-10-CM | POA: Diagnosis not present

## 2020-04-08 DIAGNOSIS — F1010 Alcohol abuse, uncomplicated: Secondary | ICD-10-CM | POA: Diagnosis not present

## 2020-04-08 DIAGNOSIS — E875 Hyperkalemia: Secondary | ICD-10-CM | POA: Diagnosis not present

## 2020-04-08 DIAGNOSIS — M21372 Foot drop, left foot: Secondary | ICD-10-CM | POA: Diagnosis not present

## 2020-04-08 DIAGNOSIS — J449 Chronic obstructive pulmonary disease, unspecified: Secondary | ICD-10-CM | POA: Diagnosis not present

## 2020-04-08 DIAGNOSIS — C61 Malignant neoplasm of prostate: Secondary | ICD-10-CM | POA: Diagnosis not present

## 2020-05-25 DIAGNOSIS — C61 Malignant neoplasm of prostate: Secondary | ICD-10-CM | POA: Diagnosis not present

## 2020-05-25 DIAGNOSIS — I10 Essential (primary) hypertension: Secondary | ICD-10-CM | POA: Diagnosis not present

## 2020-05-25 DIAGNOSIS — J449 Chronic obstructive pulmonary disease, unspecified: Secondary | ICD-10-CM | POA: Diagnosis not present

## 2020-05-27 ENCOUNTER — Ambulatory Visit: Payer: Medicare HMO | Admitting: Surgical

## 2020-05-27 ENCOUNTER — Other Ambulatory Visit: Payer: Self-pay

## 2020-05-27 ENCOUNTER — Encounter: Payer: Self-pay | Admitting: Surgical

## 2020-05-27 VITALS — BP 155/83 | HR 76 | Temp 98.5°F

## 2020-05-27 DIAGNOSIS — Z945 Skin transplant status: Secondary | ICD-10-CM

## 2020-05-27 DIAGNOSIS — C44722 Squamous cell carcinoma of skin of right lower limb, including hip: Secondary | ICD-10-CM | POA: Diagnosis not present

## 2020-05-27 NOTE — Progress Notes (Signed)
Patient is a 69 year old male here for follow-up on his right lower extremity split-thickness skin graft and left lower extremity split-thickness skin graft after excision of extensive cancer of the bilateral lower legs.  Patient had underwent excision of left and right leg skin cancer on 07/23/2019 with Dr. Marla Roe and pathology had showed hypertrophic lichenoid reaction with squamous atypia with free margins.  He had Integra placed at that same time and subsequently had split-thickness skin grafts placed on 08/19/2019.  Today he reports that he is doing well, he reports that itching over the skin grafts has resolved.  He reports that he has not been hydrating the grafts as he noticed the Vaseline runs and causes some irritation.  He reports that he has a follow-up with dermatology at the first of the year.   On exam patient has crusting dried skin over the right lower extremity split-thickness skin graft site with some thickened scarring at the borders of the graft inferiorly.  Left lower extremity split-thickness skin graft well-healed as well with dried skin overlying the graft.  He continues to have raised nontender lesions on the left lower extremity, the 2 larger lateral ones appear unchanged, the most inferior and anterior lesion appears slightly larger.  They are nontender.  There is no drainage.  They are nonmobile.  No cellulitic changes or drainage noted.  No foul odors.    Recommend Vaseline or Aquaphor over split-thickness skin graft sites for hydration, patient has a follow-up with dermatology in approximately 8 weeks to further evaluate left lower extremity lesions.  We discussed following up as needed in regards to the split-thickness skin grafts, patient is comfortable with this.  He reports that if he has any issues he will be sure to call.  I discussed with the patient that if any assistance is needed for closure/reconstruction we will be available.    Pictures were obtained of the  patient and placed in the chart with the patient's or guardian's permission.

## 2020-08-01 ENCOUNTER — Telehealth: Payer: Self-pay

## 2020-08-01 NOTE — Telephone Encounter (Signed)
Called regarding after hours message. He is requesting to reschedule 1/21 appts to 2/25. Appts moved. He is aware of appt times.  FYI

## 2020-08-05 ENCOUNTER — Other Ambulatory Visit: Payer: Medicare HMO

## 2020-08-05 ENCOUNTER — Ambulatory Visit: Payer: Medicare HMO | Admitting: Hematology and Oncology

## 2020-08-17 DIAGNOSIS — D485 Neoplasm of uncertain behavior of skin: Secondary | ICD-10-CM | POA: Diagnosis not present

## 2020-08-17 DIAGNOSIS — C44722 Squamous cell carcinoma of skin of right lower limb, including hip: Secondary | ICD-10-CM | POA: Diagnosis not present

## 2020-08-17 DIAGNOSIS — Z85828 Personal history of other malignant neoplasm of skin: Secondary | ICD-10-CM | POA: Diagnosis not present

## 2020-08-31 DIAGNOSIS — I10 Essential (primary) hypertension: Secondary | ICD-10-CM | POA: Diagnosis not present

## 2020-08-31 DIAGNOSIS — C61 Malignant neoplasm of prostate: Secondary | ICD-10-CM | POA: Diagnosis not present

## 2020-08-31 DIAGNOSIS — J449 Chronic obstructive pulmonary disease, unspecified: Secondary | ICD-10-CM | POA: Diagnosis not present

## 2020-09-09 ENCOUNTER — Inpatient Hospital Stay: Payer: Medicare HMO | Attending: Hematology and Oncology

## 2020-09-09 ENCOUNTER — Encounter: Payer: Self-pay | Admitting: Hematology and Oncology

## 2020-09-09 ENCOUNTER — Other Ambulatory Visit: Payer: Self-pay

## 2020-09-09 ENCOUNTER — Inpatient Hospital Stay: Payer: Medicare HMO | Admitting: Hematology and Oncology

## 2020-09-09 DIAGNOSIS — Z8546 Personal history of malignant neoplasm of prostate: Secondary | ICD-10-CM | POA: Diagnosis not present

## 2020-09-09 DIAGNOSIS — Z85828 Personal history of other malignant neoplasm of skin: Secondary | ICD-10-CM | POA: Insufficient documentation

## 2020-09-09 DIAGNOSIS — D72821 Monocytosis (symptomatic): Secondary | ICD-10-CM

## 2020-09-09 DIAGNOSIS — F1010 Alcohol abuse, uncomplicated: Secondary | ICD-10-CM | POA: Diagnosis not present

## 2020-09-09 DIAGNOSIS — C44722 Squamous cell carcinoma of skin of right lower limb, including hip: Secondary | ICD-10-CM

## 2020-09-09 DIAGNOSIS — Z880 Allergy status to penicillin: Secondary | ICD-10-CM | POA: Diagnosis not present

## 2020-09-09 DIAGNOSIS — E871 Hypo-osmolality and hyponatremia: Secondary | ICD-10-CM | POA: Insufficient documentation

## 2020-09-09 DIAGNOSIS — Z79899 Other long term (current) drug therapy: Secondary | ICD-10-CM | POA: Diagnosis not present

## 2020-09-09 LAB — CBC WITH DIFFERENTIAL/PLATELET
Abs Immature Granulocytes: 0.04 10*3/uL (ref 0.00–0.07)
Basophils Absolute: 0.1 10*3/uL (ref 0.0–0.1)
Basophils Relative: 1 %
Eosinophils Absolute: 0.2 10*3/uL (ref 0.0–0.5)
Eosinophils Relative: 3 %
HCT: 38.5 % — ABNORMAL LOW (ref 39.0–52.0)
Hemoglobin: 13.1 g/dL (ref 13.0–17.0)
Immature Granulocytes: 1 %
Lymphocytes Relative: 19 %
Lymphs Abs: 1.4 10*3/uL (ref 0.7–4.0)
MCH: 30.7 pg (ref 26.0–34.0)
MCHC: 34 g/dL (ref 30.0–36.0)
MCV: 90.2 fL (ref 80.0–100.0)
Monocytes Absolute: 1.2 10*3/uL — ABNORMAL HIGH (ref 0.1–1.0)
Monocytes Relative: 16 %
Neutro Abs: 4.3 10*3/uL (ref 1.7–7.7)
Neutrophils Relative %: 60 %
Platelets: 301 10*3/uL (ref 150–400)
RBC: 4.27 MIL/uL (ref 4.22–5.81)
RDW: 13.2 % (ref 11.5–15.5)
WBC: 7.2 10*3/uL (ref 4.0–10.5)
nRBC: 0 % (ref 0.0–0.2)

## 2020-09-09 LAB — COMPREHENSIVE METABOLIC PANEL
ALT: 13 U/L (ref 0–44)
AST: 14 U/L — ABNORMAL LOW (ref 15–41)
Albumin: 4.1 g/dL (ref 3.5–5.0)
Alkaline Phosphatase: 45 U/L (ref 38–126)
Anion gap: 7 (ref 5–15)
BUN: 10 mg/dL (ref 8–23)
CO2: 24 mmol/L (ref 22–32)
Calcium: 9 mg/dL (ref 8.9–10.3)
Chloride: 98 mmol/L (ref 98–111)
Creatinine, Ser: 0.92 mg/dL (ref 0.61–1.24)
GFR, Estimated: >60 mL/min (ref >60)
Glucose, Bld: 104 mg/dL — ABNORMAL HIGH (ref 70–99)
Potassium: 5.1 mmol/L (ref 3.5–5.1)
Sodium: 129 mmol/L — ABNORMAL LOW (ref 135–145)
Total Bilirubin: 1.4 mg/dL — ABNORMAL HIGH (ref 0.3–1.2)
Total Protein: 7.1 g/dL (ref 6.5–8.1)

## 2020-09-09 NOTE — Assessment & Plan Note (Signed)
He has mild hyponatremia on blood work likely due to recent alcohol use Observe for now

## 2020-09-09 NOTE — Assessment & Plan Note (Signed)
He continues close monitoring of PSA under his primary care doctor

## 2020-09-09 NOTE — Assessment & Plan Note (Signed)
He has mild chronic intermittent borderline elevated monocyte count I do not believe this represent anything of significance His ongoing chronic wound on his leg could trigger mild monocytosis This is likely benign He does not need long-term follow-up

## 2020-09-09 NOTE — Assessment & Plan Note (Signed)
He continues aggressive care under dermatologist I will defer to them for management If his skin cancer continue to recur, the patient might benefit from palliative radiation therapy in the future

## 2020-09-09 NOTE — Progress Notes (Signed)
Jesse Pope PROGRESS NOTE  Patient Care Team: Lawerance Cruel, MD as PCP - General (Family Medicine)  ASSESSMENT & PLAN:  Monocytosis He has mild chronic intermittent borderline elevated monocyte count I do not believe this represent anything of significance His ongoing chronic wound on his leg could trigger mild monocytosis This is likely benign He does not need long-term follow-up  History of skin cancer He continues aggressive care under dermatologist I will defer to them for management If his skin cancer continue to recur, the patient might benefit from palliative radiation therapy in the future  History of prostate cancer He continues close monitoring of PSA under his primary care doctor  Alcohol abuse He has mild hyponatremia on blood work likely due to recent alcohol use Observe for now   No orders of the defined types were placed in this encounter.   All questions were answered. The patient knows to call the clinic with any problems, questions or concerns. The total time spent in the appointment was 20 minutes encounter with patients including review of chart and various tests results, discussions about plan of care and coordination of care plan   [name], MD 09/09/2020 10:47 AM  INTERVAL HISTORY: Please see below for problem oriented charting. Returns for further follow-up on monocytosis He is doing well He is just saw his dermatologist recently for management of recurrent skin cancer His recent PSA was within normal range according to him.  He follows with his primary care doctor He has no symptoms of abnormal urination He continues to smoke cigars and drink beer daily  SUMMARY OF ONCOLOGIC HISTORY: Jesse Pope was transferred to my care after his prior physician has left.  I reviewed the patient's records extensive and collaborated the history with the patient. Summary of his history is as follows: 1. Extensive squamous cell  carcinoma of the skin, involving the R lower extremity -Followed by Dr. Marla Roe of plastic surgery   2. Stage I ([id number] adenocarcinoma of the prostate, GS 4+3 -01/2018 - 03/2018: prostate RT, 70 Gy/28 fx, Dr. Tammi Klippel  -On observation   He was also found to have monocytosis with negative work-up Since last time he was seen here, he continues to smoke approximately 3 cigars/day and drinks 4-5 beer per day He has occasional weeping from the skin graft He has seen his dermatologist regularly for management  REVIEW OF SYSTEMS:   Constitutional: Denies fevers, chills or abnormal weight loss Eyes: Denies blurriness of vision Ears, nose, mouth, throat, and face: Denies mucositis or sore throat Respiratory: Denies cough, dyspnea or wheezes Cardiovascular: Denies palpitation, chest discomfort or lower extremity swelling Gastrointestinal:  Denies nausea, heartburn or change in bowel habits Lymphatics: Denies new lymphadenopathy or easy bruising Neurological:Denies numbness, tingling or new weaknesses Behavioral/Psych: Mood is stable, no new changes  All other systems were reviewed with the patient and are negative.  I have reviewed the past medical history, past surgical history, social history and family history with the patient and they are unchanged from previous note.  ALLERGIES:  is allergic to penicillins.  MEDICATIONS:  Current Outpatient Medications  Medication Sig Dispense Refill  . amLODipine (NORVASC) 10 MG tablet Take 10 mg by mouth daily.    . diphenhydrAMINE (BENADRYL) 25 MG tablet Take 25 mg by mouth every morning.    Marland Kitchen FLUAD QUADRIVALENT 0.5 ML injection     . loperamide (IMODIUM) 1 MG/5ML solution Take 4 mg by mouth as needed for diarrhea or loose  stools.    . methocarbamol (ROBAXIN) 500 MG tablet Take 1 tablet (500 mg total) by mouth every 6 (six) hours as needed for muscle spasms. 30 tablet 5  . ramipril (ALTACE) 10 MG capsule Take 10 mg by mouth daily.    Marland Kitchen  triamcinolone (KENALOG) 0.025 % ointment Apply 1 application topically 2 (two) times daily. 30 g 0   No current facility-administered medications for this visit.    PHYSICAL EXAMINATION: ECOG PERFORMANCE STATUS: 0 - Asymptomatic  Vitals:   09/09/20 0751  BP: 134/67  Pulse: 72  Resp: 18  Temp: 98 F (36.7 C)  SpO2: 100%   Filed Weights   09/09/20 0751  Weight: 163 lb 9.6 oz (74.2 kg)    GENERAL:alert, no distress and comfortable SKIN: Persistent skin lesions on his leg are unchanged compared to previous exam NEURO: alert & oriented x 3 with fluent speech, no focal motor/sensory deficits  LABORATORY DATA:  I have reviewed the data as listed    Component Value Date/Time   NA 129 (L) 09/09/2020 0738   K 5.1 09/09/2020 0738   CL 98 09/09/2020 0738   CO2 24 09/09/2020 0738   GLUCOSE 104 (H) 09/09/2020 0738   BUN 10 09/09/2020 0738   CREATININE 0.92 09/09/2020 0738   CREATININE 0.98 08/05/2019 0803   CALCIUM 9.0 09/09/2020 0738   PROT 7.1 09/09/2020 0738   ALBUMIN 4.1 09/09/2020 0738   AST 14 (L) 09/09/2020 0738   AST 10 (L) 08/05/2019 0803   ALT 13 09/09/2020 0738   ALT 8 08/05/2019 0803   ALKPHOS 45 09/09/2020 0738   BILITOT 1.4 (H) 09/09/2020 0738   BILITOT 0.7 08/05/2019 0803   GFRNONAA >60 09/09/2020 0738   GFRNONAA >60 08/05/2019 0803   GFRAA >60 02/04/2020 0826   GFRAA >60 08/05/2019 0803    No results found for: SPEP, UPEP  Lab Results  Component Value Date   WBC 7.2 09/09/2020   NEUTROABS 4.3 09/09/2020   HGB 13.1 09/09/2020   HCT 38.5 (L) 09/09/2020   MCV 90.2 09/09/2020   PLT 301 09/09/2020      Chemistry      Component Value Date/Time   NA 129 (L) 09/09/2020 0738   K 5.1 09/09/2020 0738   CL 98 09/09/2020 0738   CO2 24 09/09/2020 0738   BUN 10 09/09/2020 0738   CREATININE 0.92 09/09/2020 0738   CREATININE 0.98 08/05/2019 0803      Component Value Date/Time   CALCIUM 9.0 09/09/2020 0738   ALKPHOS 45 09/09/2020 0738   AST 14 (L)  09/09/2020 0738   AST 10 (L) 08/05/2019 0803   ALT 13 09/09/2020 0738   ALT 8 08/05/2019 0803   BILITOT 1.4 (H) 09/09/2020 0738   BILITOT 0.7 08/05/2019 5329

## 2020-10-27 DIAGNOSIS — L433 Subacute (active) lichen planus: Secondary | ICD-10-CM | POA: Diagnosis not present

## 2020-10-27 DIAGNOSIS — Z79899 Other long term (current) drug therapy: Secondary | ICD-10-CM | POA: Diagnosis not present

## 2020-10-27 DIAGNOSIS — Z85828 Personal history of other malignant neoplasm of skin: Secondary | ICD-10-CM | POA: Diagnosis not present

## 2020-11-22 DIAGNOSIS — C61 Malignant neoplasm of prostate: Secondary | ICD-10-CM | POA: Diagnosis not present

## 2020-11-22 DIAGNOSIS — J449 Chronic obstructive pulmonary disease, unspecified: Secondary | ICD-10-CM | POA: Diagnosis not present

## 2020-11-22 DIAGNOSIS — I10 Essential (primary) hypertension: Secondary | ICD-10-CM | POA: Diagnosis not present

## 2021-01-27 DIAGNOSIS — L011 Impetiginization of other dermatoses: Secondary | ICD-10-CM | POA: Diagnosis not present

## 2021-01-27 DIAGNOSIS — Z85828 Personal history of other malignant neoplasm of skin: Secondary | ICD-10-CM | POA: Diagnosis not present

## 2021-01-27 DIAGNOSIS — L430 Hypertrophic lichen planus: Secondary | ICD-10-CM | POA: Diagnosis not present

## 2021-01-27 DIAGNOSIS — L0889 Other specified local infections of the skin and subcutaneous tissue: Secondary | ICD-10-CM | POA: Diagnosis not present

## 2021-01-27 DIAGNOSIS — Z79899 Other long term (current) drug therapy: Secondary | ICD-10-CM | POA: Diagnosis not present

## 2021-02-23 DIAGNOSIS — J449 Chronic obstructive pulmonary disease, unspecified: Secondary | ICD-10-CM | POA: Diagnosis not present

## 2021-02-23 DIAGNOSIS — I10 Essential (primary) hypertension: Secondary | ICD-10-CM | POA: Diagnosis not present

## 2021-03-16 DIAGNOSIS — C61 Malignant neoplasm of prostate: Secondary | ICD-10-CM | POA: Diagnosis not present

## 2021-03-16 DIAGNOSIS — Z136 Encounter for screening for cardiovascular disorders: Secondary | ICD-10-CM | POA: Diagnosis not present

## 2021-03-16 DIAGNOSIS — I10 Essential (primary) hypertension: Secondary | ICD-10-CM | POA: Diagnosis not present

## 2021-03-22 DIAGNOSIS — Z0000 Encounter for general adult medical examination without abnormal findings: Secondary | ICD-10-CM | POA: Diagnosis not present

## 2021-03-22 DIAGNOSIS — M21372 Foot drop, left foot: Secondary | ICD-10-CM | POA: Diagnosis not present

## 2021-03-22 DIAGNOSIS — F17200 Nicotine dependence, unspecified, uncomplicated: Secondary | ICD-10-CM | POA: Diagnosis not present

## 2021-03-22 DIAGNOSIS — I10 Essential (primary) hypertension: Secondary | ICD-10-CM | POA: Diagnosis not present

## 2021-03-22 DIAGNOSIS — Z23 Encounter for immunization: Secondary | ICD-10-CM | POA: Diagnosis not present

## 2021-03-22 DIAGNOSIS — J449 Chronic obstructive pulmonary disease, unspecified: Secondary | ICD-10-CM | POA: Diagnosis not present

## 2021-03-22 DIAGNOSIS — M5136 Other intervertebral disc degeneration, lumbar region: Secondary | ICD-10-CM | POA: Diagnosis not present

## 2021-03-22 DIAGNOSIS — C61 Malignant neoplasm of prostate: Secondary | ICD-10-CM | POA: Diagnosis not present

## 2021-03-22 DIAGNOSIS — F1010 Alcohol abuse, uncomplicated: Secondary | ICD-10-CM | POA: Diagnosis not present

## 2021-04-21 DIAGNOSIS — I10 Essential (primary) hypertension: Secondary | ICD-10-CM | POA: Diagnosis not present

## 2021-05-30 DIAGNOSIS — Z79899 Other long term (current) drug therapy: Secondary | ICD-10-CM | POA: Diagnosis not present

## 2021-05-30 DIAGNOSIS — L433 Subacute (active) lichen planus: Secondary | ICD-10-CM | POA: Diagnosis not present

## 2021-05-30 DIAGNOSIS — Z85828 Personal history of other malignant neoplasm of skin: Secondary | ICD-10-CM | POA: Diagnosis not present

## 2021-09-25 DIAGNOSIS — Z79899 Other long term (current) drug therapy: Secondary | ICD-10-CM | POA: Diagnosis not present

## 2021-09-25 DIAGNOSIS — L433 Subacute (active) lichen planus: Secondary | ICD-10-CM | POA: Diagnosis not present

## 2021-09-25 DIAGNOSIS — Z85828 Personal history of other malignant neoplasm of skin: Secondary | ICD-10-CM | POA: Diagnosis not present

## 2021-09-29 IMAGING — DX DG CHEST 1V PORT
1 series · 1 of 1 positions shown · non-contrast
Comparison: 09/07/2015 chest radiograph

CLINICAL DATA: Acute pain following fall.  Initial encounter.

EXAM:
PORTABLE CHEST 1 VIEW

[chest ap]
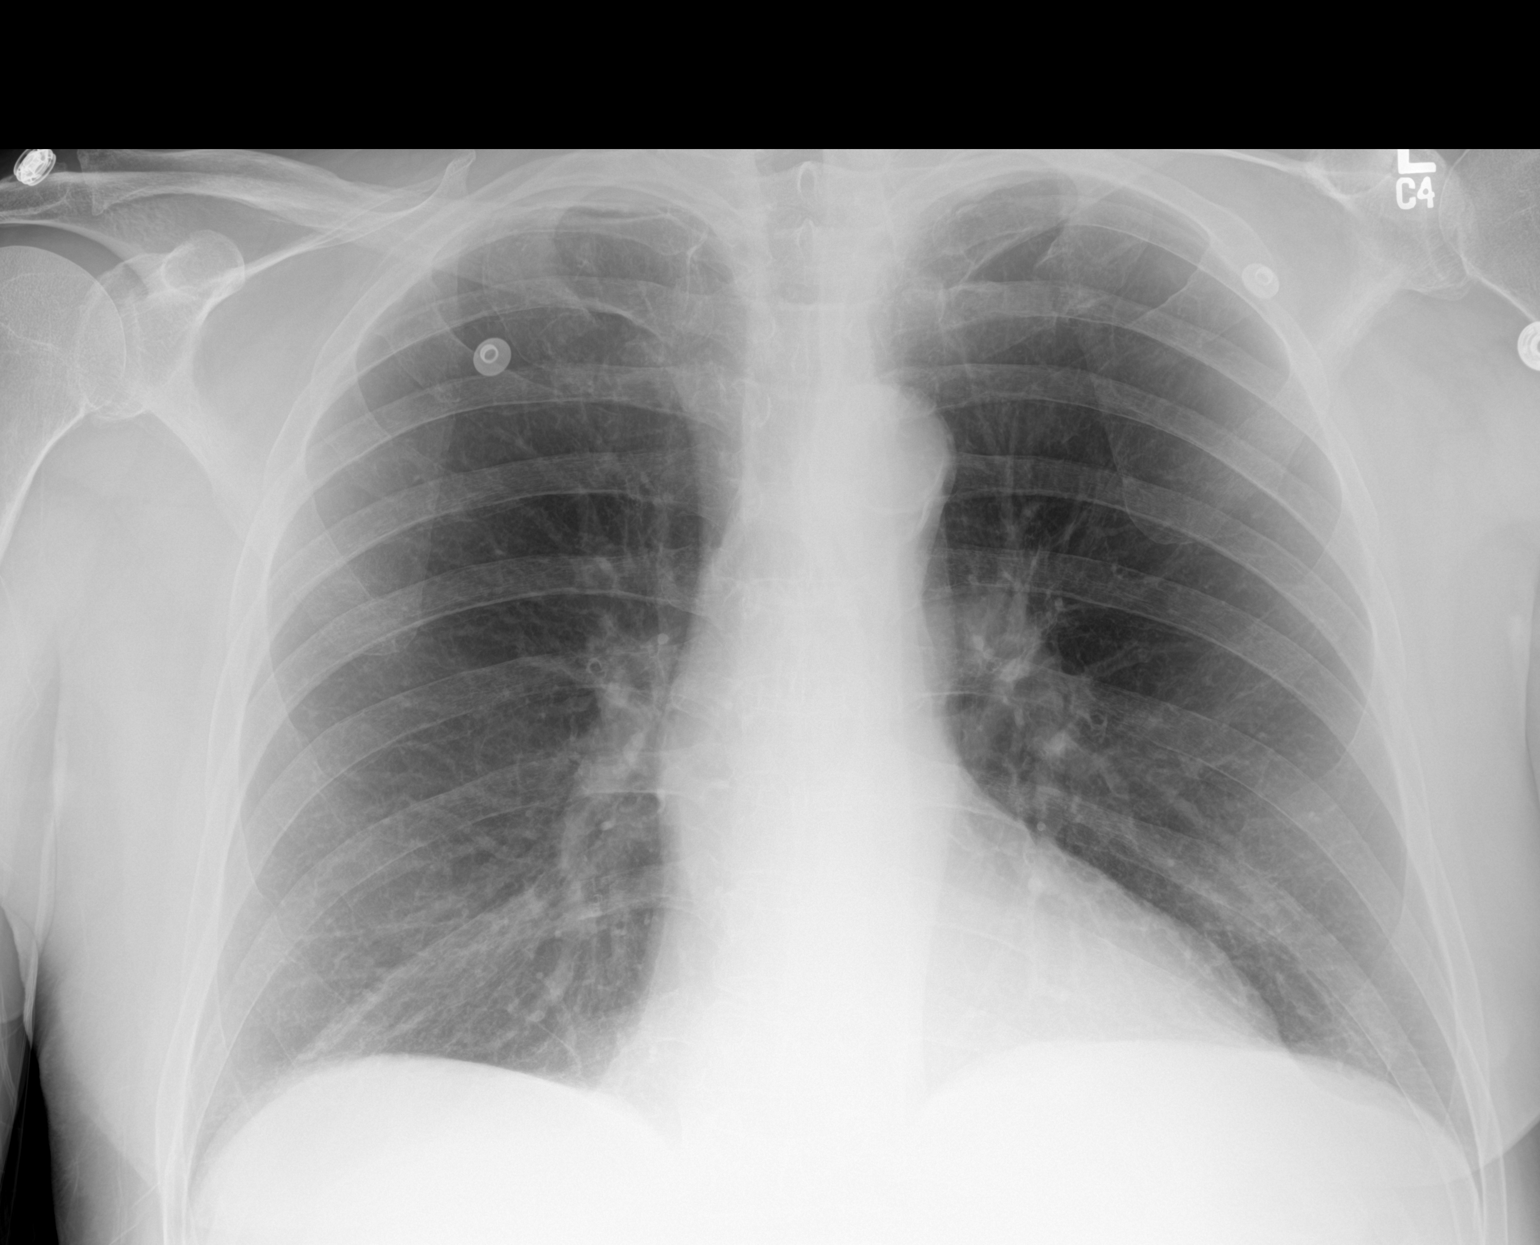

[1 of 1 positions shown; findings below may reference images not displayed]

FINDINGS: The cardiomediastinal silhouette is unremarkable.

There is no evidence of focal airspace disease, pulmonary edema,
suspicious pulmonary nodule/mass, pleural effusion, or pneumothorax.

No acute bony abnormalities are identified.
IMPRESSION: No active disease.

## 2021-10-11 IMAGING — RF DG C-ARM 1-60 MIN
1 series · 4 of 4 positions shown · non-contrast
Comparison: 05/08/2019

CLINICAL DATA: Left humeral shaft fracture fixation.

EXAM:
LEFT HUMERUS - 2+ VIEW; DG C-ARM 1-60 MIN

[Series 1: run · 4 of 4 slices shown]
[im 1/4]
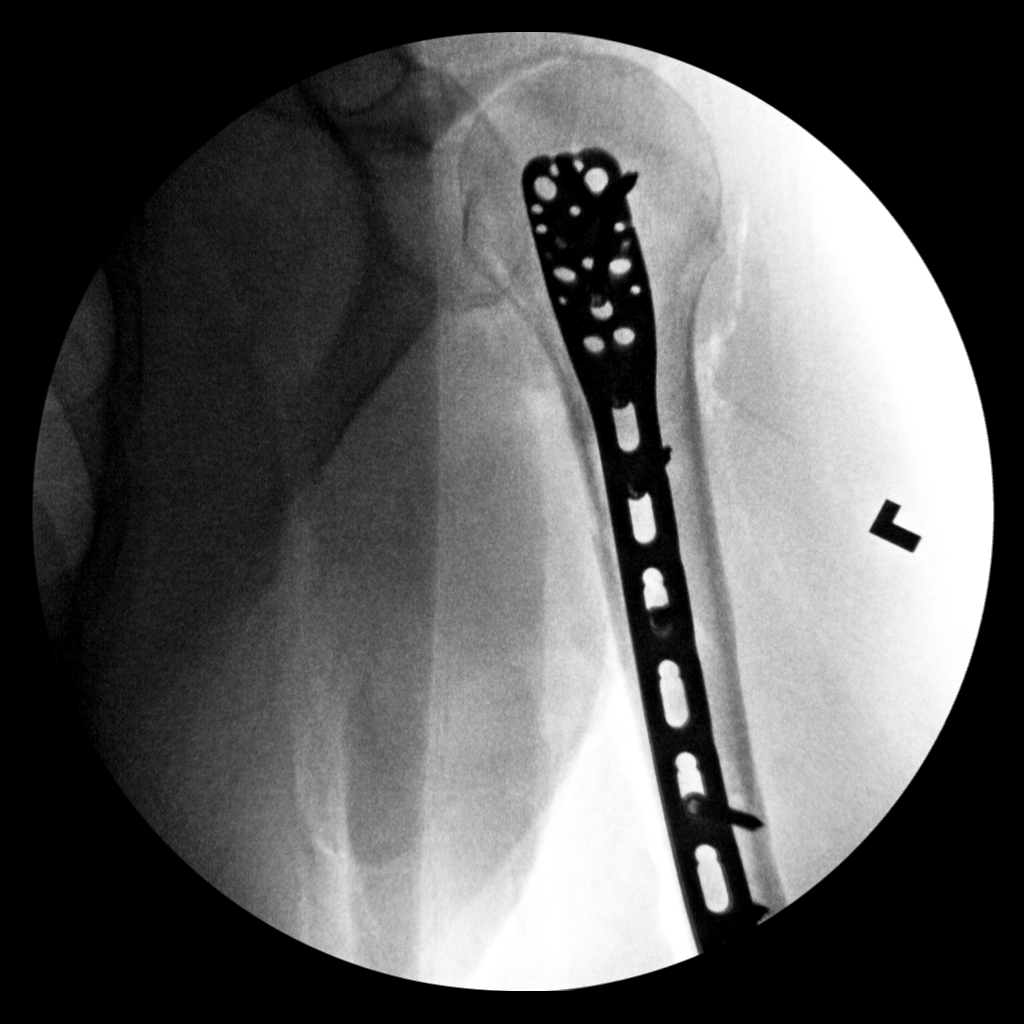
[im 2/4]
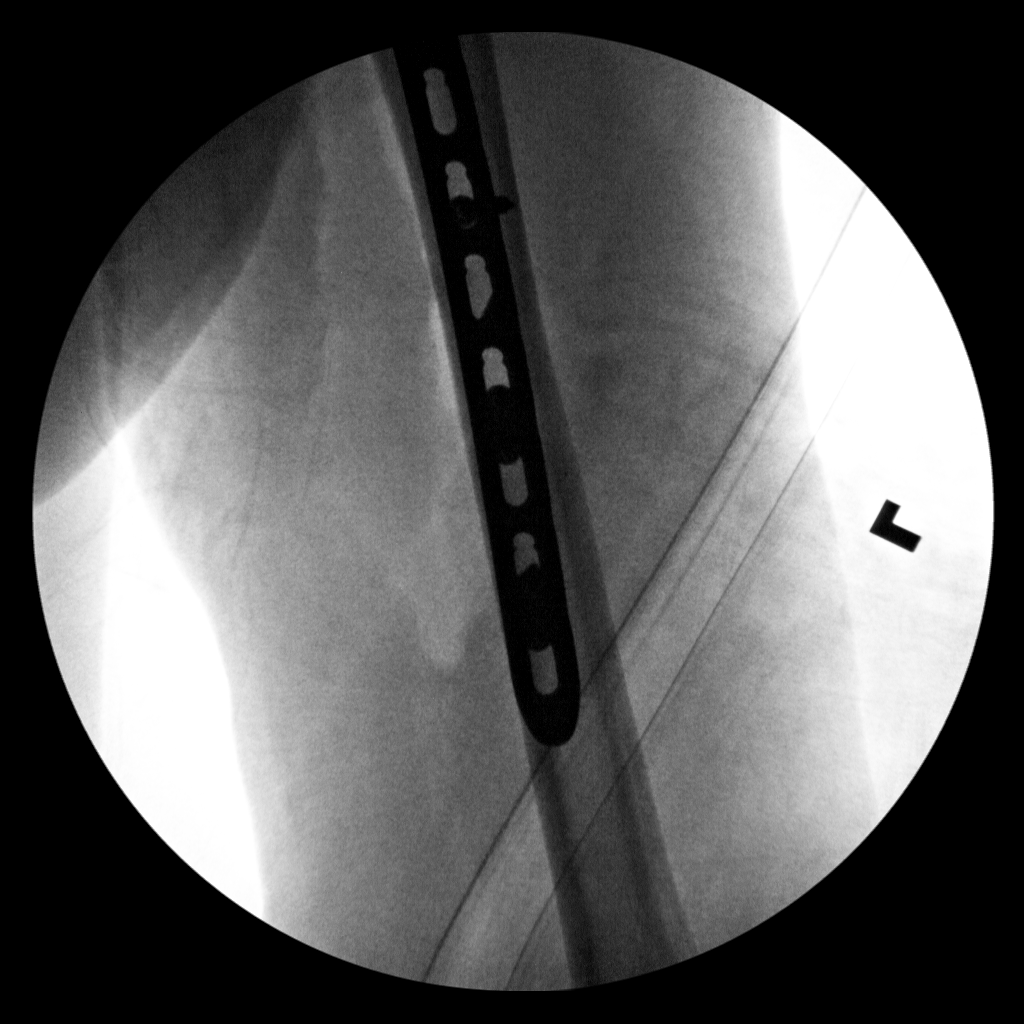
[im 3/4]
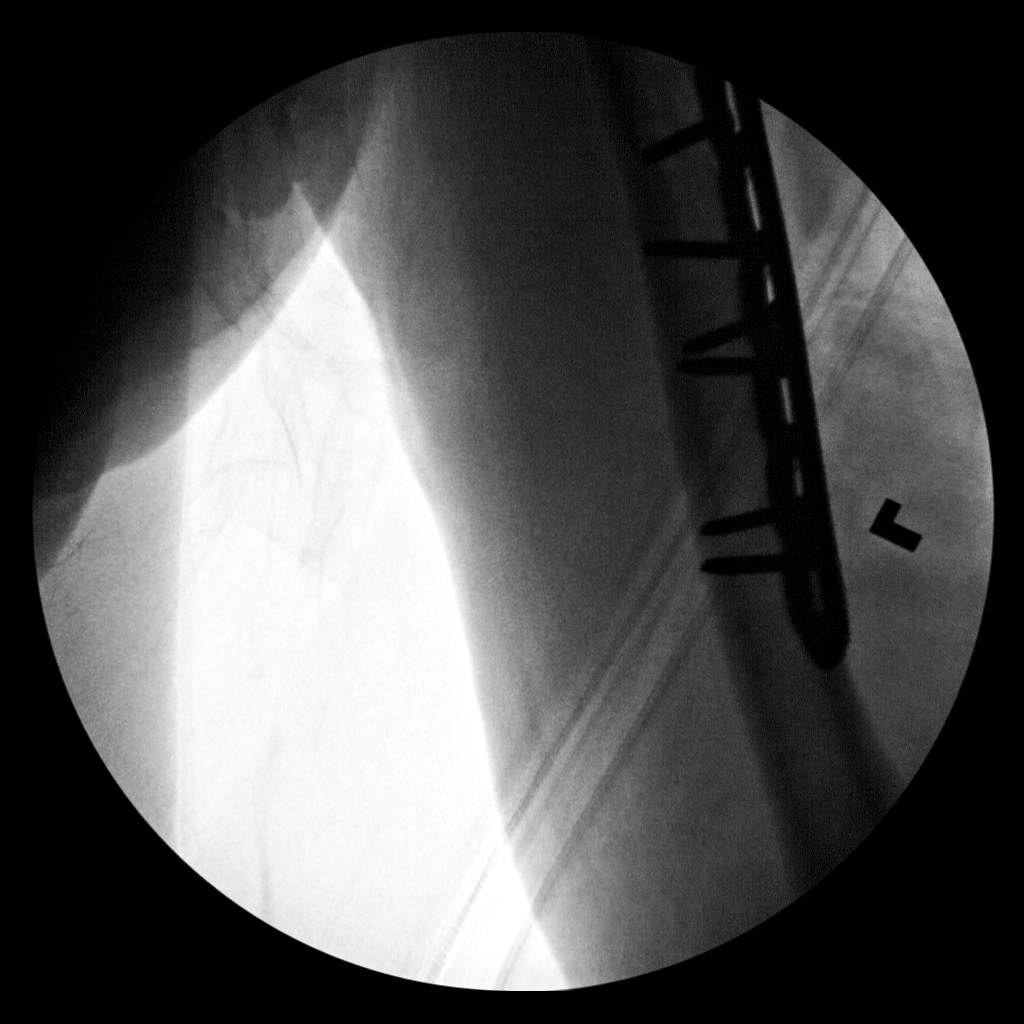
[im 4/4]
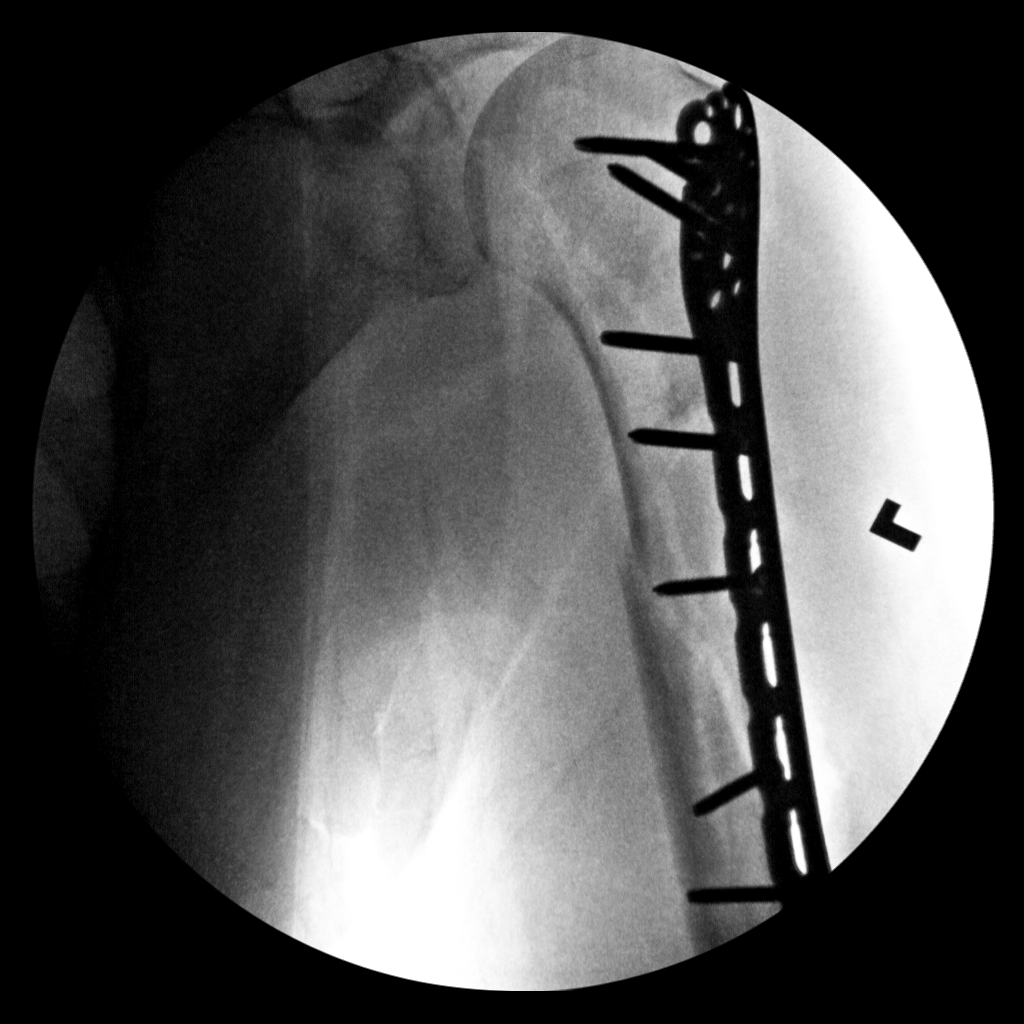

[4 of 4 positions shown; findings below may reference images not displayed]

FINDINGS: Multiple intraoperative fluoroscopic spot images demonstrate open
reduction and internal fixation of a long spiral type humeral shaft
fracture. There is a long lateral sideplate and compression screws
with anatomic reduction of the fracture and no complicating
features.
IMPRESSION: Open reduction and internal fixation of the humeral shaft fracture
with anatomic alignment and no complicating features.

## 2022-01-25 DIAGNOSIS — L821 Other seborrheic keratosis: Secondary | ICD-10-CM | POA: Diagnosis not present

## 2022-01-25 DIAGNOSIS — Z85828 Personal history of other malignant neoplasm of skin: Secondary | ICD-10-CM | POA: Diagnosis not present

## 2022-01-25 DIAGNOSIS — L433 Subacute (active) lichen planus: Secondary | ICD-10-CM | POA: Diagnosis not present

## 2022-02-06 NOTE — Telephone Encounter (Signed)
error 

## 2022-04-06 DIAGNOSIS — J449 Chronic obstructive pulmonary disease, unspecified: Secondary | ICD-10-CM | POA: Diagnosis not present

## 2022-04-06 DIAGNOSIS — Z6826 Body mass index (BMI) 26.0-26.9, adult: Secondary | ICD-10-CM | POA: Diagnosis not present

## 2022-04-06 DIAGNOSIS — Z0000 Encounter for general adult medical examination without abnormal findings: Secondary | ICD-10-CM | POA: Diagnosis not present

## 2022-04-06 DIAGNOSIS — C61 Malignant neoplasm of prostate: Secondary | ICD-10-CM | POA: Diagnosis not present

## 2022-04-06 DIAGNOSIS — K219 Gastro-esophageal reflux disease without esophagitis: Secondary | ICD-10-CM | POA: Diagnosis not present

## 2022-04-06 DIAGNOSIS — E871 Hypo-osmolality and hyponatremia: Secondary | ICD-10-CM | POA: Diagnosis not present

## 2022-04-06 DIAGNOSIS — Z23 Encounter for immunization: Secondary | ICD-10-CM | POA: Diagnosis not present

## 2022-04-06 DIAGNOSIS — I10 Essential (primary) hypertension: Secondary | ICD-10-CM | POA: Diagnosis not present

## 2022-04-09 DIAGNOSIS — I10 Essential (primary) hypertension: Secondary | ICD-10-CM | POA: Diagnosis not present

## 2022-05-24 DIAGNOSIS — M25561 Pain in right knee: Secondary | ICD-10-CM | POA: Diagnosis not present

## 2022-06-14 DIAGNOSIS — M170 Bilateral primary osteoarthritis of knee: Secondary | ICD-10-CM | POA: Diagnosis not present

## 2022-06-18 DIAGNOSIS — M1711 Unilateral primary osteoarthritis, right knee: Secondary | ICD-10-CM | POA: Diagnosis not present

## 2022-06-25 DIAGNOSIS — M1711 Unilateral primary osteoarthritis, right knee: Secondary | ICD-10-CM | POA: Diagnosis not present

## 2022-06-27 DIAGNOSIS — R059 Cough, unspecified: Secondary | ICD-10-CM | POA: Diagnosis not present

## 2022-06-27 DIAGNOSIS — Z6826 Body mass index (BMI) 26.0-26.9, adult: Secondary | ICD-10-CM | POA: Diagnosis not present

## 2022-07-02 DIAGNOSIS — M1711 Unilateral primary osteoarthritis, right knee: Secondary | ICD-10-CM | POA: Diagnosis not present

## 2022-07-23 DIAGNOSIS — M25561 Pain in right knee: Secondary | ICD-10-CM | POA: Diagnosis not present

## 2022-07-30 DIAGNOSIS — D225 Melanocytic nevi of trunk: Secondary | ICD-10-CM | POA: Diagnosis not present

## 2022-07-30 DIAGNOSIS — Z85828 Personal history of other malignant neoplasm of skin: Secondary | ICD-10-CM | POA: Diagnosis not present

## 2022-07-30 DIAGNOSIS — L84 Corns and callosities: Secondary | ICD-10-CM | POA: Diagnosis not present

## 2022-07-30 DIAGNOSIS — L433 Subacute (active) lichen planus: Secondary | ICD-10-CM | POA: Diagnosis not present

## 2022-07-30 DIAGNOSIS — L821 Other seborrheic keratosis: Secondary | ICD-10-CM | POA: Diagnosis not present

## 2022-07-30 DIAGNOSIS — D692 Other nonthrombocytopenic purpura: Secondary | ICD-10-CM | POA: Diagnosis not present

## 2022-10-30 DIAGNOSIS — Z86010 Personal history of colonic polyps: Secondary | ICD-10-CM | POA: Diagnosis not present

## 2022-10-30 DIAGNOSIS — Z800 Family history of malignant neoplasm of digestive organs: Secondary | ICD-10-CM | POA: Diagnosis not present

## 2022-10-30 DIAGNOSIS — D120 Benign neoplasm of cecum: Secondary | ICD-10-CM | POA: Diagnosis not present

## 2022-10-30 DIAGNOSIS — Z09 Encounter for follow-up examination after completed treatment for conditions other than malignant neoplasm: Secondary | ICD-10-CM | POA: Diagnosis not present

## 2022-10-30 DIAGNOSIS — K5730 Diverticulosis of large intestine without perforation or abscess without bleeding: Secondary | ICD-10-CM | POA: Diagnosis not present

## 2022-11-01 DIAGNOSIS — D120 Benign neoplasm of cecum: Secondary | ICD-10-CM | POA: Diagnosis not present

## 2022-12-07 DIAGNOSIS — S61011A Laceration without foreign body of right thumb without damage to nail, initial encounter: Secondary | ICD-10-CM | POA: Diagnosis not present

## 2022-12-14 DIAGNOSIS — Z4802 Encounter for removal of sutures: Secondary | ICD-10-CM | POA: Diagnosis not present

## 2023-04-25 DIAGNOSIS — M791 Myalgia, unspecified site: Secondary | ICD-10-CM | POA: Diagnosis not present

## 2023-04-25 DIAGNOSIS — Z6826 Body mass index (BMI) 26.0-26.9, adult: Secondary | ICD-10-CM | POA: Diagnosis not present

## 2023-04-25 DIAGNOSIS — J449 Chronic obstructive pulmonary disease, unspecified: Secondary | ICD-10-CM | POA: Diagnosis not present

## 2023-04-25 DIAGNOSIS — Z8546 Personal history of malignant neoplasm of prostate: Secondary | ICD-10-CM | POA: Diagnosis not present

## 2023-04-25 DIAGNOSIS — A4902 Methicillin resistant Staphylococcus aureus infection, unspecified site: Secondary | ICD-10-CM | POA: Diagnosis not present

## 2023-04-25 DIAGNOSIS — F1010 Alcohol abuse, uncomplicated: Secondary | ICD-10-CM | POA: Diagnosis not present

## 2023-04-25 DIAGNOSIS — Z0000 Encounter for general adult medical examination without abnormal findings: Secondary | ICD-10-CM | POA: Diagnosis not present

## 2023-04-25 DIAGNOSIS — Z08 Encounter for follow-up examination after completed treatment for malignant neoplasm: Secondary | ICD-10-CM | POA: Diagnosis not present

## 2023-04-25 DIAGNOSIS — Z23 Encounter for immunization: Secondary | ICD-10-CM | POA: Diagnosis not present

## 2023-04-25 DIAGNOSIS — I10 Essential (primary) hypertension: Secondary | ICD-10-CM | POA: Diagnosis not present

## 2023-07-30 DIAGNOSIS — I10 Essential (primary) hypertension: Secondary | ICD-10-CM | POA: Diagnosis not present

## 2023-07-30 DIAGNOSIS — R899 Unspecified abnormal finding in specimens from other organs, systems and tissues: Secondary | ICD-10-CM | POA: Diagnosis not present

## 2023-07-31 DIAGNOSIS — Z85828 Personal history of other malignant neoplasm of skin: Secondary | ICD-10-CM | POA: Diagnosis not present

## 2023-07-31 DIAGNOSIS — D225 Melanocytic nevi of trunk: Secondary | ICD-10-CM | POA: Diagnosis not present

## 2023-07-31 DIAGNOSIS — L6611 Classic lichen planopilaris: Secondary | ICD-10-CM | POA: Diagnosis not present

## 2023-07-31 DIAGNOSIS — L570 Actinic keratosis: Secondary | ICD-10-CM | POA: Diagnosis not present

## 2023-07-31 DIAGNOSIS — C44219 Basal cell carcinoma of skin of left ear and external auricular canal: Secondary | ICD-10-CM | POA: Diagnosis not present

## 2023-07-31 DIAGNOSIS — L430 Hypertrophic lichen planus: Secondary | ICD-10-CM | POA: Diagnosis not present

## 2023-08-07 DIAGNOSIS — R944 Abnormal results of kidney function studies: Secondary | ICD-10-CM | POA: Diagnosis not present

## 2023-08-07 DIAGNOSIS — J449 Chronic obstructive pulmonary disease, unspecified: Secondary | ICD-10-CM | POA: Diagnosis not present

## 2023-08-07 DIAGNOSIS — K219 Gastro-esophageal reflux disease without esophagitis: Secondary | ICD-10-CM | POA: Diagnosis not present

## 2023-08-07 DIAGNOSIS — Z6826 Body mass index (BMI) 26.0-26.9, adult: Secondary | ICD-10-CM | POA: Diagnosis not present

## 2023-08-07 DIAGNOSIS — I10 Essential (primary) hypertension: Secondary | ICD-10-CM | POA: Diagnosis not present

## 2023-08-08 DIAGNOSIS — Z85828 Personal history of other malignant neoplasm of skin: Secondary | ICD-10-CM | POA: Diagnosis not present

## 2023-08-08 DIAGNOSIS — C44219 Basal cell carcinoma of skin of left ear and external auricular canal: Secondary | ICD-10-CM | POA: Diagnosis not present

## 2023-08-20 DIAGNOSIS — R944 Abnormal results of kidney function studies: Secondary | ICD-10-CM | POA: Diagnosis not present

## 2023-09-19 DIAGNOSIS — R899 Unspecified abnormal finding in specimens from other organs, systems and tissues: Secondary | ICD-10-CM | POA: Diagnosis not present

## 2023-09-19 DIAGNOSIS — I10 Essential (primary) hypertension: Secondary | ICD-10-CM | POA: Diagnosis not present

## 2023-11-14 DIAGNOSIS — Z85828 Personal history of other malignant neoplasm of skin: Secondary | ICD-10-CM | POA: Diagnosis not present

## 2023-11-14 DIAGNOSIS — D485 Neoplasm of uncertain behavior of skin: Secondary | ICD-10-CM | POA: Diagnosis not present

## 2023-11-14 DIAGNOSIS — L720 Epidermal cyst: Secondary | ICD-10-CM | POA: Diagnosis not present

## 2023-11-14 DIAGNOSIS — B079 Viral wart, unspecified: Secondary | ICD-10-CM | POA: Diagnosis not present

## 2024-02-03 DIAGNOSIS — M545 Low back pain, unspecified: Secondary | ICD-10-CM | POA: Diagnosis not present

## 2024-02-03 DIAGNOSIS — M47816 Spondylosis without myelopathy or radiculopathy, lumbar region: Secondary | ICD-10-CM | POA: Diagnosis not present

## 2024-02-19 DIAGNOSIS — M47816 Spondylosis without myelopathy or radiculopathy, lumbar region: Secondary | ICD-10-CM | POA: Diagnosis not present

## 2024-02-19 DIAGNOSIS — M5459 Other low back pain: Secondary | ICD-10-CM | POA: Diagnosis not present

## 2024-03-11 DIAGNOSIS — M545 Low back pain, unspecified: Secondary | ICD-10-CM | POA: Diagnosis not present

## 2024-03-18 DIAGNOSIS — M47816 Spondylosis without myelopathy or radiculopathy, lumbar region: Secondary | ICD-10-CM | POA: Diagnosis not present

## 2024-03-18 DIAGNOSIS — M545 Low back pain, unspecified: Secondary | ICD-10-CM | POA: Diagnosis not present

## 2024-03-18 DIAGNOSIS — M5459 Other low back pain: Secondary | ICD-10-CM | POA: Diagnosis not present

## 2024-03-18 DIAGNOSIS — M5416 Radiculopathy, lumbar region: Secondary | ICD-10-CM | POA: Diagnosis not present

## 2024-03-26 DIAGNOSIS — M5416 Radiculopathy, lumbar region: Secondary | ICD-10-CM | POA: Diagnosis not present

## 2024-04-08 DIAGNOSIS — M5416 Radiculopathy, lumbar region: Secondary | ICD-10-CM | POA: Diagnosis not present

## 2024-04-08 DIAGNOSIS — M5459 Other low back pain: Secondary | ICD-10-CM | POA: Diagnosis not present

## 2024-04-08 DIAGNOSIS — M545 Low back pain, unspecified: Secondary | ICD-10-CM | POA: Diagnosis not present

## 2024-04-08 DIAGNOSIS — M47816 Spondylosis without myelopathy or radiculopathy, lumbar region: Secondary | ICD-10-CM | POA: Diagnosis not present

## 2024-05-11 DIAGNOSIS — G729 Myopathy, unspecified: Secondary | ICD-10-CM | POA: Diagnosis not present

## 2024-05-11 DIAGNOSIS — J449 Chronic obstructive pulmonary disease, unspecified: Secondary | ICD-10-CM | POA: Diagnosis not present

## 2024-05-11 DIAGNOSIS — E7800 Pure hypercholesterolemia, unspecified: Secondary | ICD-10-CM | POA: Diagnosis not present

## 2024-05-11 DIAGNOSIS — Z08 Encounter for follow-up examination after completed treatment for malignant neoplasm: Secondary | ICD-10-CM | POA: Diagnosis not present

## 2024-05-11 DIAGNOSIS — Z8546 Personal history of malignant neoplasm of prostate: Secondary | ICD-10-CM | POA: Diagnosis not present

## 2024-05-11 DIAGNOSIS — Z0000 Encounter for general adult medical examination without abnormal findings: Secondary | ICD-10-CM | POA: Diagnosis not present

## 2024-05-11 DIAGNOSIS — Z23 Encounter for immunization: Secondary | ICD-10-CM | POA: Diagnosis not present

## 2024-05-11 DIAGNOSIS — I10 Essential (primary) hypertension: Secondary | ICD-10-CM | POA: Diagnosis not present

## 2024-05-11 DIAGNOSIS — F1010 Alcohol abuse, uncomplicated: Secondary | ICD-10-CM | POA: Diagnosis not present

## 2024-05-11 DIAGNOSIS — Z6827 Body mass index (BMI) 27.0-27.9, adult: Secondary | ICD-10-CM | POA: Diagnosis not present

## 2024-05-27 DIAGNOSIS — J069 Acute upper respiratory infection, unspecified: Secondary | ICD-10-CM | POA: Diagnosis not present

## 2024-05-27 DIAGNOSIS — J029 Acute pharyngitis, unspecified: Secondary | ICD-10-CM | POA: Diagnosis not present

## 2024-06-13 DIAGNOSIS — J189 Pneumonia, unspecified organism: Secondary | ICD-10-CM | POA: Diagnosis not present

## 2024-06-18 DIAGNOSIS — J189 Pneumonia, unspecified organism: Secondary | ICD-10-CM | POA: Diagnosis not present
